# Patient Record
Sex: Female | Born: 1980 | Race: Black or African American | Hispanic: No | Marital: Married | Smoking: Never smoker
Health system: Southern US, Community
[De-identification: ages and names within clinical notes are randomized; demographics above are authoritative.]

## PROBLEM LIST (undated history)

## (undated) ENCOUNTER — Inpatient Hospital Stay (HOSPITAL_COMMUNITY): Payer: Self-pay

## (undated) DIAGNOSIS — I10 Essential (primary) hypertension: Secondary | ICD-10-CM

## (undated) DIAGNOSIS — Z923 Personal history of irradiation: Secondary | ICD-10-CM

## (undated) DIAGNOSIS — F329 Major depressive disorder, single episode, unspecified: Secondary | ICD-10-CM

## (undated) DIAGNOSIS — C801 Malignant (primary) neoplasm, unspecified: Secondary | ICD-10-CM

## (undated) DIAGNOSIS — F419 Anxiety disorder, unspecified: Secondary | ICD-10-CM

## (undated) DIAGNOSIS — Z9889 Other specified postprocedural states: Secondary | ICD-10-CM

## (undated) DIAGNOSIS — E119 Type 2 diabetes mellitus without complications: Secondary | ICD-10-CM

## (undated) DIAGNOSIS — Z98891 History of uterine scar from previous surgery: Secondary | ICD-10-CM

## (undated) DIAGNOSIS — A749 Chlamydial infection, unspecified: Secondary | ICD-10-CM

## (undated) DIAGNOSIS — R112 Nausea with vomiting, unspecified: Secondary | ICD-10-CM

## (undated) DIAGNOSIS — F32A Depression, unspecified: Secondary | ICD-10-CM

## (undated) DIAGNOSIS — O139 Gestational [pregnancy-induced] hypertension without significant proteinuria, unspecified trimester: Secondary | ICD-10-CM

## (undated) HISTORY — PX: WISDOM TOOTH EXTRACTION: SHX21

---

## 2004-03-03 ENCOUNTER — Emergency Department (HOSPITAL_COMMUNITY): Admission: EM | Admit: 2004-03-03 | Discharge: 2004-03-03 | Payer: Self-pay | Admitting: Emergency Medicine

## 2004-03-07 ENCOUNTER — Ambulatory Visit (HOSPITAL_COMMUNITY): Admission: RE | Admit: 2004-03-07 | Discharge: 2004-03-07 | Payer: Self-pay | Admitting: Emergency Medicine

## 2004-08-20 ENCOUNTER — Encounter (INDEPENDENT_AMBULATORY_CARE_PROVIDER_SITE_OTHER): Payer: Self-pay | Admitting: *Deleted

## 2004-08-20 LAB — CONVERTED CEMR LAB

## 2005-04-10 ENCOUNTER — Emergency Department (HOSPITAL_COMMUNITY): Admission: EM | Admit: 2005-04-10 | Discharge: 2005-04-10 | Payer: Self-pay | Admitting: Emergency Medicine

## 2005-07-09 ENCOUNTER — Emergency Department (HOSPITAL_COMMUNITY): Admission: EM | Admit: 2005-07-09 | Discharge: 2005-07-09 | Payer: Self-pay | Admitting: Family Medicine

## 2005-08-07 ENCOUNTER — Ambulatory Visit: Payer: Self-pay | Admitting: Sports Medicine

## 2005-09-07 ENCOUNTER — Ambulatory Visit: Payer: Self-pay | Admitting: Family Medicine

## 2005-09-18 ENCOUNTER — Ambulatory Visit: Payer: Self-pay | Admitting: Family Medicine

## 2005-10-19 ENCOUNTER — Ambulatory Visit: Payer: Self-pay | Admitting: Family Medicine

## 2005-12-04 ENCOUNTER — Emergency Department (HOSPITAL_COMMUNITY): Admission: EM | Admit: 2005-12-04 | Discharge: 2005-12-04 | Payer: Self-pay | Admitting: Family Medicine

## 2005-12-06 ENCOUNTER — Emergency Department (HOSPITAL_COMMUNITY): Admission: EM | Admit: 2005-12-06 | Discharge: 2005-12-06 | Payer: Self-pay | Admitting: Emergency Medicine

## 2005-12-07 ENCOUNTER — Ambulatory Visit: Payer: Self-pay | Admitting: Family Medicine

## 2005-12-29 ENCOUNTER — Ambulatory Visit: Payer: Self-pay | Admitting: Family Medicine

## 2006-02-08 ENCOUNTER — Ambulatory Visit: Payer: Self-pay | Admitting: Family Medicine

## 2006-03-06 ENCOUNTER — Ambulatory Visit: Payer: Self-pay | Admitting: Family Medicine

## 2006-07-19 DIAGNOSIS — E669 Obesity, unspecified: Secondary | ICD-10-CM | POA: Insufficient documentation

## 2006-07-20 ENCOUNTER — Encounter (INDEPENDENT_AMBULATORY_CARE_PROVIDER_SITE_OTHER): Payer: Self-pay | Admitting: *Deleted

## 2006-07-30 ENCOUNTER — Telehealth (INDEPENDENT_AMBULATORY_CARE_PROVIDER_SITE_OTHER): Payer: Self-pay | Admitting: *Deleted

## 2006-08-01 ENCOUNTER — Telehealth: Payer: Self-pay | Admitting: *Deleted

## 2006-08-01 ENCOUNTER — Ambulatory Visit: Payer: Self-pay | Admitting: Sports Medicine

## 2006-10-16 ENCOUNTER — Telehealth (INDEPENDENT_AMBULATORY_CARE_PROVIDER_SITE_OTHER): Payer: Self-pay | Admitting: *Deleted

## 2006-12-05 ENCOUNTER — Telehealth: Payer: Self-pay | Admitting: *Deleted

## 2006-12-17 ENCOUNTER — Telehealth (INDEPENDENT_AMBULATORY_CARE_PROVIDER_SITE_OTHER): Payer: Self-pay | Admitting: *Deleted

## 2006-12-19 ENCOUNTER — Ambulatory Visit: Payer: Self-pay | Admitting: Family Medicine

## 2006-12-27 ENCOUNTER — Telehealth (INDEPENDENT_AMBULATORY_CARE_PROVIDER_SITE_OTHER): Payer: Self-pay | Admitting: Family Medicine

## 2006-12-31 ENCOUNTER — Encounter (INDEPENDENT_AMBULATORY_CARE_PROVIDER_SITE_OTHER): Payer: Self-pay | Admitting: Family Medicine

## 2007-01-04 ENCOUNTER — Telehealth (INDEPENDENT_AMBULATORY_CARE_PROVIDER_SITE_OTHER): Payer: Self-pay | Admitting: *Deleted

## 2007-01-09 ENCOUNTER — Telehealth (INDEPENDENT_AMBULATORY_CARE_PROVIDER_SITE_OTHER): Payer: Self-pay | Admitting: *Deleted

## 2007-01-10 ENCOUNTER — Telehealth (INDEPENDENT_AMBULATORY_CARE_PROVIDER_SITE_OTHER): Payer: Self-pay | Admitting: *Deleted

## 2007-01-15 ENCOUNTER — Ambulatory Visit: Payer: Self-pay | Admitting: Family Medicine

## 2007-01-15 ENCOUNTER — Encounter (INDEPENDENT_AMBULATORY_CARE_PROVIDER_SITE_OTHER): Payer: Self-pay | Admitting: Family Medicine

## 2007-01-17 ENCOUNTER — Telehealth: Payer: Self-pay | Admitting: *Deleted

## 2007-05-19 ENCOUNTER — Emergency Department (HOSPITAL_COMMUNITY): Admission: EM | Admit: 2007-05-19 | Discharge: 2007-05-20 | Payer: Self-pay | Admitting: Emergency Medicine

## 2007-05-20 ENCOUNTER — Telehealth (INDEPENDENT_AMBULATORY_CARE_PROVIDER_SITE_OTHER): Payer: Self-pay | Admitting: Family Medicine

## 2007-07-23 ENCOUNTER — Inpatient Hospital Stay (HOSPITAL_COMMUNITY): Admission: AD | Admit: 2007-07-23 | Discharge: 2007-07-23 | Payer: Self-pay | Admitting: Obstetrics & Gynecology

## 2007-09-04 ENCOUNTER — Encounter: Payer: Self-pay | Admitting: *Deleted

## 2007-12-06 ENCOUNTER — Ambulatory Visit: Payer: Self-pay | Admitting: Family Medicine

## 2008-01-17 ENCOUNTER — Ambulatory Visit: Payer: Self-pay | Admitting: Family Medicine

## 2008-01-17 ENCOUNTER — Encounter: Admission: RE | Admit: 2008-01-17 | Discharge: 2008-01-17 | Payer: Self-pay | Admitting: Family Medicine

## 2008-02-24 ENCOUNTER — Telehealth: Payer: Self-pay | Admitting: *Deleted

## 2008-04-13 ENCOUNTER — Ambulatory Visit: Payer: Self-pay | Admitting: Family Medicine

## 2008-04-13 ENCOUNTER — Encounter: Payer: Self-pay | Admitting: Family Medicine

## 2008-04-13 ENCOUNTER — Telehealth: Payer: Self-pay | Admitting: *Deleted

## 2008-04-13 LAB — CONVERTED CEMR LAB: Whiff Test: NEGATIVE

## 2008-04-20 LAB — CONVERTED CEMR LAB
Chlamydia, DNA Probe: NEGATIVE
GC Probe Amp, Genital: NEGATIVE

## 2008-04-27 ENCOUNTER — Encounter: Payer: Self-pay | Admitting: *Deleted

## 2008-04-27 ENCOUNTER — Telehealth: Payer: Self-pay | Admitting: *Deleted

## 2008-04-27 ENCOUNTER — Emergency Department (HOSPITAL_COMMUNITY): Admission: EM | Admit: 2008-04-27 | Discharge: 2008-04-27 | Payer: Self-pay | Admitting: Emergency Medicine

## 2008-04-28 ENCOUNTER — Ambulatory Visit: Payer: Self-pay | Admitting: Family Medicine

## 2008-04-28 ENCOUNTER — Encounter: Payer: Self-pay | Admitting: Family Medicine

## 2008-04-30 ENCOUNTER — Encounter (INDEPENDENT_AMBULATORY_CARE_PROVIDER_SITE_OTHER): Payer: Self-pay | Admitting: Family Medicine

## 2008-04-30 ENCOUNTER — Ambulatory Visit: Payer: Self-pay | Admitting: Family Medicine

## 2008-05-07 ENCOUNTER — Ambulatory Visit: Payer: Self-pay | Admitting: Family Medicine

## 2008-05-08 ENCOUNTER — Telehealth: Payer: Self-pay | Admitting: Family Medicine

## 2008-05-27 ENCOUNTER — Ambulatory Visit: Payer: Self-pay | Admitting: Family Medicine

## 2008-06-03 ENCOUNTER — Telehealth: Payer: Self-pay | Admitting: *Deleted

## 2008-06-04 ENCOUNTER — Encounter: Payer: Self-pay | Admitting: Family Medicine

## 2008-06-05 ENCOUNTER — Encounter: Payer: Self-pay | Admitting: Family Medicine

## 2008-11-09 ENCOUNTER — Ambulatory Visit: Payer: Self-pay | Admitting: Family Medicine

## 2008-11-09 ENCOUNTER — Telehealth: Payer: Self-pay | Admitting: Family Medicine

## 2008-11-09 LAB — CONVERTED CEMR LAB
Bilirubin Urine: NEGATIVE
Glucose, Urine, Semiquant: NEGATIVE
Ketones, urine, test strip: NEGATIVE
Nitrite: NEGATIVE
Protein, U semiquant: NEGATIVE
Specific Gravity, Urine: 1.01
Urobilinogen, UA: 0.2
pH: 7

## 2008-11-10 ENCOUNTER — Encounter: Payer: Self-pay | Admitting: Family Medicine

## 2009-02-22 ENCOUNTER — Ambulatory Visit: Payer: Self-pay | Admitting: Family Medicine

## 2009-02-22 LAB — CONVERTED CEMR LAB: Whiff Test: POSITIVE

## 2009-02-23 ENCOUNTER — Encounter: Payer: Self-pay | Admitting: Family Medicine

## 2009-02-23 ENCOUNTER — Telehealth: Payer: Self-pay | Admitting: *Deleted

## 2009-02-24 ENCOUNTER — Telehealth (INDEPENDENT_AMBULATORY_CARE_PROVIDER_SITE_OTHER): Payer: Self-pay | Admitting: *Deleted

## 2009-02-26 ENCOUNTER — Ambulatory Visit: Payer: Self-pay | Admitting: Family Medicine

## 2009-02-26 DIAGNOSIS — B009 Herpesviral infection, unspecified: Secondary | ICD-10-CM | POA: Insufficient documentation

## 2009-07-08 ENCOUNTER — Telehealth: Payer: Self-pay | Admitting: Family Medicine

## 2009-07-21 ENCOUNTER — Ambulatory Visit: Payer: Self-pay | Admitting: Family Medicine

## 2009-08-04 ENCOUNTER — Encounter: Payer: Self-pay | Admitting: Family Medicine

## 2009-09-27 ENCOUNTER — Telehealth: Payer: Self-pay | Admitting: Family Medicine

## 2009-10-20 ENCOUNTER — Encounter: Payer: Self-pay | Admitting: Family Medicine

## 2009-10-20 ENCOUNTER — Telehealth: Payer: Self-pay | Admitting: Family Medicine

## 2009-10-25 ENCOUNTER — Telehealth: Payer: Self-pay | Admitting: Family Medicine

## 2009-10-27 ENCOUNTER — Ambulatory Visit: Payer: Self-pay | Admitting: Family Medicine

## 2009-11-09 ENCOUNTER — Ambulatory Visit: Payer: Self-pay | Admitting: Family Medicine

## 2009-11-19 ENCOUNTER — Telehealth (INDEPENDENT_AMBULATORY_CARE_PROVIDER_SITE_OTHER): Payer: Self-pay | Admitting: Family Medicine

## 2009-11-30 ENCOUNTER — Ambulatory Visit: Payer: Self-pay | Admitting: Family Medicine

## 2009-11-30 ENCOUNTER — Encounter: Payer: Self-pay | Admitting: Family Medicine

## 2010-02-22 ENCOUNTER — Telehealth: Payer: Self-pay | Admitting: *Deleted

## 2010-04-11 ENCOUNTER — Encounter: Payer: Self-pay | Admitting: Family Medicine

## 2010-04-11 ENCOUNTER — Ambulatory Visit (HOSPITAL_COMMUNITY): Admission: RE | Admit: 2010-04-11 | Discharge: 2010-04-11 | Payer: Self-pay | Admitting: Family Medicine

## 2010-04-11 ENCOUNTER — Encounter: Payer: Self-pay | Admitting: *Deleted

## 2010-04-11 ENCOUNTER — Ambulatory Visit: Payer: Self-pay | Admitting: Family Medicine

## 2010-04-11 DIAGNOSIS — F410 Panic disorder [episodic paroxysmal anxiety] without agoraphobia: Secondary | ICD-10-CM | POA: Insufficient documentation

## 2010-04-11 DIAGNOSIS — K3189 Other diseases of stomach and duodenum: Secondary | ICD-10-CM | POA: Insufficient documentation

## 2010-04-11 DIAGNOSIS — R1013 Epigastric pain: Secondary | ICD-10-CM

## 2010-04-11 DIAGNOSIS — G5600 Carpal tunnel syndrome, unspecified upper limb: Secondary | ICD-10-CM | POA: Insufficient documentation

## 2010-04-11 DIAGNOSIS — R0789 Other chest pain: Secondary | ICD-10-CM | POA: Insufficient documentation

## 2010-04-11 HISTORY — DX: Panic disorder (episodic paroxysmal anxiety): F41.0
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
## 2010-06-21 NOTE — Assessment & Plan Note (Signed)
Summary: depression/anxiety- worsened   Vital Signs:  Patient profile:   30 year old female Height:      63 inches Weight:      174.6 pounds BMI:     31.04 Temp:     97.7 degrees F oral Pulse rate:   80 / minute BP sitting:   126 / 78  (left arm) Cuff size:   regular  Vitals Entered By: Gladstone Pih (October 27, 2009 3:42 PM) CC: Anxiety issues, needs refill on Xanax Is Patient Diabetic? No Pain Assessment Patient in pain? no        Primary Care Provider:  Marisue Ivan  MD  CC:  Anxiety issues and needs refill on Xanax.  History of Present Illness: 30yo F c/o worsened anxiety and depression  Anxiety/Depression- Reports worsened symptoms over the past 1 1/2 months.  Reports more crying, sad days, decrease sleep/concentration.  No SI/HI.  Last dose of fluoxetine was 2 months ago.  She didn't think it was helping.  She attributes the worsening symptoms to her current job situation and stress of paying back school loans.    Habits & Providers  Alcohol-Tobacco-Diet     Tobacco Status: never  Current Medications (verified): 1)  Citalopram Hydrobromide 20 Mg Tabs (Citalopram Hydrobromide) .Marland Kitchen.. 1 Tab By Mouth Daily 2)  Valacyclovir Hcl 500 Mg Tabs (Valacyclovir Hcl) .... One Tablet By Mouth Daily  Allergies (verified): No Known Drug Allergies  Review of Systems      See HPI  Physical Exam  General:  VS Reviewed. Well appearing, NAD.  Psych:  Good eye contact A&O x 3 Not anxious appearing Emotionally labile No SI/HI   Impression & Recommendations:  Problem # 1:  DEPRESSION/ANXIETY (ICD-300.4) Assessment Deteriorated Symptoms worsened x 1 1/2 months due to current job and financial situation. Reassured me that she has no suicidal or homicidal ideations. Plan to change to celexa 20mg  daily with f/u in 2 weeks to reassess.  Orders: [facility]- Est Level  3 (85631)  Complete Medication List: 1)  Citalopram Hydrobromide 20 Mg Tabs (Citalopram hydrobromide) .Marland Kitchen.. 1  tab by mouth daily 2)  Valacyclovir Hcl 500 Mg Tabs (Valacyclovir hcl) .... One tablet by mouth daily  Patient Instructions: 1)  Please schedule a follow-up appointment in 2 weeks.  2)  I started you on citalopram 20mg  daily. Prescriptions: CITALOPRAM HYDROBROMIDE 20 MG TABS (CITALOPRAM HYDROBROMIDE) 1 tab by mouth daily  #30 x 1   Entered and Authorized by:   Marisue Ivan  MD   Signed by:   Marisue Ivan  MD on 10/27/2009   Method used:   Electronically to        [facility] [address] #[id number]* (retail)       [address] [place], [place]  [address]       Ph: [phone]       Fax: [phone]   RxID:   [id number]

## 2010-06-21 NOTE — Progress Notes (Signed)
Summary: refill  Phone Note Refill Request Call back at [phone] Message from:  Patient  Refills Requested: Medication #1:  VALACYCLOVIR HCL 500 MG TABS one tablet by mouth daily [facility] [address] [facility] [address] needs today  Initial call taken by: De Nurse,  February 22, 2010 9:54 AM  Follow-up for Phone Call        done & pt notified Follow-up by: Golden Circle RN,  February 22, 2010 10:14 AM    Prescriptions: VALACYCLOVIR HCL 500 MG TABS (VALACYCLOVIR HCL) one tablet by mouth daily  #34 x 3   Entered by:   Golden Circle RN   Authorized by:   Renold Don MD   Signed by:   Golden Circle RN on 02/22/2010   Method used:   Electronically to        [facility] #[id number]* (retail)       [address] [place], [place]  [address]       Ph: [phone]       Fax: [phone]   RxID:   [id number]

## 2010-06-21 NOTE — Assessment & Plan Note (Signed)
Summary: panic attacks,df   Vital Signs:  Patient profile:   30 year old female Height:      63 inches Weight:      180.5 pounds BMI:     32.09 Temp:     98.1 degrees F oral Pulse rate:   78 / minute BP sitting:   132 / 91  (right arm) Cuff size:   regular  Vitals Entered By: Garen Grams LPN (July 21, 252 4:30 PM) CC: panic attacks with frequent headaches Is Patient Diabetic? No   Primary Care Provider:  Marisue Ivan  MD  CC:  panic attacks with frequent headaches.  History of Present Illness: 1) Panic attacks 2) Depression: Patient with a history of panic attacks and depression comes in today to discuss increasing frequency of panic attacks. Prescribed Prozac but reports that she has only been taking it 2-3 days per week. Panic attacks worsened 2 weeks ago; stem from worry about her finances - Therapist, sports, bills, income tax. Symptoms are shortness of breath, left sided chest tightness, severe worry and tearfulness and headaches. Episodes last 10 - 20 minutes; relieved by deep breathing, going away from people. Reports decreased appetite, decreased concentration, anhedonia, tearfulness. Denies sleep disturbances, decreased energy, increased guilt, suicidal or homicidal ideation. Review of medicines reveals that patient has last had Fluoxetine filled in October 2010 for 90 pills. Has also been on Xanax as needed in the past, which she states helps with her panic attcks.       Habits & Providers  Alcohol-Tobacco-Diet     Tobacco Status: never  Current Medications (verified): 1)  Fluoxetine Hcl 10 Mg Tabs (Fluoxetine Hcl) .... One Tablet By Mouth in The Morning 2)  Replens  Gel (Vaginal Lubricant) .... Apply To External  Vaginal Area As Needed. Disp 30 G 3)  Flagyl 500 Mg Tabs (Metronidazole) .... One Tab By Mouth Two Times A Day X7 Days 4)  Valacyclovir Hcl 500 Mg Tabs (Valacyclovir Hcl) .... One Tablet By Mouth Daily 5)  Alprazolam 0.5 Mg Tabs (Alprazolam) .... One Tab  By Mouth Two Times A Day As Needed For Anxyiety, Panic Attacks  Allergies (verified): No Known Drug Allergies  Physical Exam  General:  vitals reviewed.  tearful but NAD  Lungs:  CTAB  Heart:  RRR no murmurs  Psych:  Oriented X3, memory intact for recent and remote, good eye contact, not anxious appearing, not agitated, not suicidal, not homicidal, depressed affect, and tearful.     Impression & Recommendations:  Problem # 1:  PANIC ATTACKS (ICD-300.01) Assessment Deteriorated  Will restart fluoxetine at previous dose. Advised on importance of taking medications every day. Will use alprazolam as a bridge; would discontinue once on stable dose of SSRI w/ improvement in symptoms. CBT - patient to think about things that make her happy and to try to put these into place in her life. Follow up one week with PCP for further management of this issue.  Her updated medication list for this problem includes:    Fluoxetine Hcl 10 Mg Tabs (Fluoxetine hcl) ..... One tablet by mouth in the morning    Alprazolam 0.5 Mg Tabs (Alprazolam) ..... One tab by mouth two times a day as needed for anxyiety, panic attacks  Orders: [facility]- Est Level  3 (27062)  Problem # 2:  DEPRESSION, MAJOR, RECURRENT (ICD-296.30) Assessment: Deteriorated  Will restart fluoxetine at previous dose. Advised on importance of taking medications every day. CBT - patient to think about things that  make her happy and to try to put these into place in her life. Follow up one week with PCP for further management of this issue. Titrate up SSRI as tolerated.   Orders: [facility]- Est Level  3 (29528)  Complete Medication List: 1)  Fluoxetine Hcl 10 Mg Tabs (Fluoxetine hcl) .... One tablet by mouth in the morning 2)  Replens Gel (Vaginal lubricant) .... Apply to external  vaginal area as needed. disp 30 g 3)  Flagyl 500 Mg Tabs (Metronidazole) .... One tab by mouth two times a day x7 days 4)  Valacyclovir Hcl 500 Mg Tabs (Valacyclovir  hcl) .... One tablet by mouth daily 5)  Alprazolam 0.5 Mg Tabs (Alprazolam) .... One tab by mouth two times a day as needed for anxyiety, panic attacks  Patient Instructions: 1)  Follow up with Dr. Burnadette Pop in 1 week.  2)  Start taking your Prozac EVERY day. 3)  Take alprazolam (Xanax) as needed as directed for panic attacks.  4)  Think about things that make you happy and try to put these into place in your life.  Prescriptions: FLUOXETINE HCL 10 MG TABS (FLUOXETINE HCL) one tablet by mouth in the morning  #90 x 0   Entered and Authorized by:   Bobby Rumpf  MD   Signed by:   Bobby Rumpf  MD on 07/21/2009   Method used:   Print then Give to Patient   RxID:   [id number] ALPRAZOLAM 0.5 MG TABS (ALPRAZOLAM) one tab by mouth two times a day as needed for anxyiety, panic attacks  #20 x 0   Entered and Authorized by:   Bobby Rumpf  MD   Signed by:   Bobby Rumpf  MD on 07/21/2009   Method used:   Print then Give to Patient   RxID:   [id number]

## 2010-06-21 NOTE — Progress Notes (Signed)
Summary: Letter Needed  Phone Note Call from Patient Call back at Home Phone [phone]   Caller: Patient Summary of Call: Needs new letter being more specific about her dx such as why she is depressed.  The finiacial aid people are saying the first letter is not clear enough for them.  Pt would like to pick up letter. Initial call taken by: Clydell Hakim,  November 19, 2009 2:44 PM  Follow-up for Phone Call        to PCP Follow-up by: Gladstone Pih,  November 19, 2009 3:19 PM  Additional Follow-up for Phone Call Additional follow up Details #1::        This is a new patient for me.  I have reviewed her medical record.  I could probably provide her with a somewhat detailed letter, but she would most likely need to be seen to go over with me the details of her illness. Additional Follow-up by: Renold Don MD,  November 22, 2009 7:48 PM    Additional Follow-up for Phone Call Additional follow up Details #2::    left message to return call Follow-up by: Gladstone Pih,  November 23, 2009 9:09 AM  Additional Follow-up for Phone Call Additional follow up Details #3:: Details for Additional Follow-up Action Taken: Spoke with Pt, will call back to sched apt to discuss information needed for letter, to PCP FYI Additional Follow-up by: Gladstone Pih,  November 23, 2009 9:36 AM  thanks, will see her in clinic.  Renold Don MD  November 23, 2009 1:14 PM

## 2010-06-21 NOTE — Letter (Signed)
Summary: Generic Letter  [facility]  [address]   [place], [place] [address]   Phone: [phone]  Fax: [phone]    11/30/2009  Brooke Marks [address] [place], [place]  [address]  To Whom I May Concern:  I am writing to discuss my patient Brooke Marks, who has been experiencing increasing depression and anxiety since 10/30/2001 and the death of her father.  Her symptoms have progressively worsened over the past year to the point where she is now experiencing daily symptoms, including almost daily panic attacks.  Some of these occur multiple times a day.  Her symptoms are progressing to the point where she is having difficulty with her activities of daily living.    She has attempted counseling with therapists as well as multiple trials of medications with only minimal relief.  She continues ongoing, current therapy treatments as well as medication in an attempt to relieve these symptoms.  She is also currently under my care for this condition.  Please take her worsening symptoms into consideration.   Sincerely,   Renold Don MD   Appended Document: Generic Letter cert mailed

## 2010-06-21 NOTE — Miscellaneous (Signed)
Summary: panic attack  Clinical Lists Changes states this series of panic attacks started 2 weeks ago. (has had this for years) the meds usually takes care of it. feels tingly & no feeling in L hand, tingling down entire L side. HA pain is 7/10 . has been having panic attacks daily. she is at work & has not taken her meds. suggested she take the med for anxiety & may take otc for the HA. sent to ED due to only L sided numbness & level of pt's concern.Golden Circle RN  August 04, 2009 12:11 PM  Agree with plan.........Marland KitchenMarisue Ivan, MD

## 2010-06-21 NOTE — Progress Notes (Signed)
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 Initial call taken by: Knox Royalty,  October 20, 2009 10:13 AM  Follow-up for Phone Call        letter completed Follow-up by: Marisue Ivan  MD,  October 20, 2009 4:22 PM    forward to pcp.Loralee Pacas CMA  October 20, 2009 2:31 PM

## 2010-06-21 NOTE — Assessment & Plan Note (Signed)
Summary: f/u depression and discuss wt   Vital Signs:  Patient profile:   30 year old female Height:      63 inches Weight:      174.8 pounds BMI:     31.08 Temp:     98.6 degrees F oral Pulse rate:   80 / minute BP sitting:   126 / 80  (left arm) Cuff size:   regular  Vitals Entered By: Gladstone Pih (November 09, 2009 3:49 PM) CC: F/U Depression and Meds Is Patient Diabetic? No Pain Assessment Patient in pain? no        Primary Care Provider:  Marisue Ivan  MD  CC:  F/U Depression and Meds.  History of Present Illness: 30yo F here for f/u of depression and to discuss wt  Depression: Improved since last visit and starting Celexa.  No adverse side effects from medication.  No SI/HI.  Reports improvement with financial situation and stressors.  Feels more happy with things and wants to continue on the medication.  Weight: Pt desires to lose wt.  States she initially gained wt after starting depo.  She has since d/c'd the depo and has been exercising 3-4 times a week for an hour at a time doing aerobic exercises.  She is trying to eat healthy but has difficulty with her mom's unhealthy cooking.  Habits & Providers  Alcohol-Tobacco-Diet     Tobacco Status: never  Current Medications (verified): 1)  Citalopram Hydrobromide 20 Mg Tabs (Citalopram Hydrobromide) .Marland Kitchen.. 1 Tab By Mouth Daily 2)  Valacyclovir Hcl 500 Mg Tabs (Valacyclovir Hcl) .... One Tablet By Mouth Daily  Allergies (verified): No Known Drug Allergies  Review of Systems      See HPI  Physical Exam  General:  VS Reviewed. Well appearing, NAD.  Psych:  A&O x 3 Normal affect good eye contact No SI/HI   Impression & Recommendations:  Problem # 1:  DEPRESSION/ANXIETY (ICD-300.4) Assessment Improved  Symptoms drastically improved in the past  2 weeks. Will continue on the Celexa and f/u in the next 2-3 months or sooner if needed.  Orders: [facility]- Est Level  3 ([address])  Problem # 2:  OBESITY, NOS  (ICD-278.00) Assessment: Unchanged  BMI 31 Discussed calorie counting and burning more calories than she takes in.  She is intelligent enough and active enough to implement this program. She is to f/u as needed if she is having difficulty losing weight or calculating calories.  Orders: [facility]- Est Level  3 ([address]454)  Complete Medication List: 1)  Citalopram Hydrobromide 20 Mg Tabs (Citalopram hydrobromide) .Marland Kitchen.. 1 tab by mouth daily 2)  Valacyclovir Hcl 500 Mg Tabs (Valacyclovir hcl) .... One tablet by mouth daily

## 2010-06-21 NOTE — Assessment & Plan Note (Signed)
Summary: letter needed,tcb   Vital Signs:  Patient profile:   30 year old female Weight:      174 pounds BMI:     30.93 Temp:     97.8 degrees F oral Pulse rate:   88 / minute BP sitting:   130 / 96  (right arm) Cuff size:   regular  Vitals Entered By: Jimmy Footman, CMA (November 30, 2009 3:25 PM) CC: Needs letter stateing why she is depressed and for anexity Is Patient Diabetic? No Pain Assessment Patient in pain? no        Primary Care Provider:  Marisue Ivan  MD  CC:  Needs letter stateing why she is depressed and for anexity.  History of Present Illness: Depression/anxiety:  Started in Oct 11, 2001 after death of her father.  States that since then "I can't get stable."  Has attempted therapy with school coaches and school counselors.  Describes attacks with variable presentation: can't breathe, shaky, head hurts, left side numb, chest hurts, feels like she will drop dead.  Can last just a few minutes or hours at a time.  Feels like going to sleep is the only thing that helps.  No real alleviating factors except time.  No specific triggers for panic attacks, just feels chronic depression at baseline.    ROS:  currently no headaches, pre-syncopal or syncopal episodes, chest pain, palpitations, shortness of breath or dyspnea, abdominal pain, diarrhea or constipation, melena, hematochezia, lower extremity swelling.     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 Take 1 Tab By Mouth Daily For Depression 2)  Valacyclovir Hcl 500 Mg Tabs (Valacyclovir Hcl) .... One Tablet By Mouth Daily 3)  Lorazepam 1 Mg Tabs (Lorazepam) .... Take 1 Tab By Mouth Q 8 Hours As Needed or Anxiety  Allergies (verified): No Known Drug Allergies  Past History:  Past medical, surgical, family and  social histories (including risk factors) reviewed, and no changes noted (except as noted below).  Past Medical History: Reviewed history from 05/27/2008 and no changes required. Recurrent Depression  Past Surgical History: Reviewed history from 12/19/2006 and no changes required. none  Family History: Reviewed history from 12/19/2006 and no changes required. HTN, DM, Renal Dz, CAD across both sides father died- MI Brother died of poisoning 2006-10-12  Social History: Reviewed history from 05/27/2008 and no changes required. Lives at home with mother, [place].  Father died in 10/11/2001 of MI @ 72yo.  Has a large family, 1 suster and 3 brothers from same mother, 2 sisters and 1 brother from same father.  Works at [employer] in [employer].  School at [employer], [employer].  Nonsmoker, non-drinker.    Physical Exam  General:  Vital signs reviewed Well-developed, well-nourished patient in NAD.  Awake, cooperative.  Psych:  no suicidal or homicidal ideations.  Good eye contact.  No anxious or depressed appearing.  Oriented X3, memory intact for recent and remote, and normally interactive.     Impression & Recommendations:  Problem # 1:  DEPRESSION/ANXIETY (ICD-300.4) Assessment Unchanged Chronic problem for patient.  Per patient she has attempted multiple anti-depressants in past but unable to name any by memory.  States Xanax has worked for her in past.  Told patient I as a provider am not comfortable prescribing Xanax due to short duration of action.  States Celexa may be  helping some.  Plan to increase dose today, will provide 2 week bridge of Ativan, just for 2 weeks.  Patient expresses understanding.  Provided letter detailing her condition. Orders: [facility]- Est Level  3 ([address])  Complete Medication List: 1)  Citalopram Hydrobromide 40 Mg Tabs (Citalopram hydrobromide) .... Take 1 tab by mouth daily for depression 2)  Valacyclovir Hcl 500 Mg Tabs (Valacyclovir hcl) .... One tablet by mouth daily 3)  Lorazepam  1 Mg Tabs (Lorazepam) .... Take 1 tab by mouth q 8 hours as needed or anxiety Prescriptions: LORAZEPAM 1 MG TABS (LORAZEPAM) Take 1 tab by mouth q 8 hours as needed or anxiety  #20 x 0   Entered and Authorized by:   Renold Don MD   Signed by:   Renold Don MD on 11/30/2009   Method used:   Printed then faxed to ...       [facility]  [address] [phone]* (retail)       [address]       [place], [place]  [address]       Ph: [phone]       Fax: [phone]   RxID:   [id number] CITALOPRAM HYDROBROMIDE 40 MG TABS (CITALOPRAM HYDROBROMIDE) Take 1 tab by mouth daily for depression  #31 x 3   Entered and Authorized by:   Renold Don MD   Signed by:   Renold Don MD on 11/30/2009   Method used:   Electronically to        [facility] [phone]* (retail)       [address]       [place], [place]  [address]       Ph: [phone]       Fax: [phone]   RxID:   [id number]

## 2010-06-21 NOTE — Miscellaneous (Signed)
Summary: triage call  Clinical Lists Changes patient calls stating she had had chest pain , sharp pains around her heart off and on for 2 days . also has episodes of SOB, feeling like she cannot take a deep breath. feels some tightness in her chest at times. not having discomfort currently . states she has known panic attacks.  advised her to go to ED now. she states she cannot leave work until 2:00 PM and would prefer to be seen here. over the phone patient does not sound in any distress. appointment scheduled at 3:15 PM today for work in but advised to go to ED if worsens. Theresia Lo RN  April 11, 2010 10:26 AM

## 2010-06-21 NOTE — Letter (Signed)
Summary: Generic Letter  [facility]  [address]   [place], [place] [address]   Phone: [phone]  Fax: [phone]    10/20/2009  [name] [address] [place], [place]  [address]  To Whom It May Concern,  Brooke Marks (per her permission) asked me to disclose the date of her diagnosis of panic attacks which was 08/07/2005.  Sincerely,   Brooke Ivan  MD  Appended Document: Generic Letter letter sent  Appended Document: Generic Letter Letter returned uable to forward.  Called left message for pt to call and update address.
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
## 2010-06-21 NOTE — Progress Notes (Signed)
Summary: Rx Req  Phone Note Call from Patient Call back at Home Phone [phone]   Caller: Patient Summary of Call: Pt requesting rx for xanax says she has gotten it from [place] before.  Pt uses [facility] on [address]. Initial call taken by: Clydell Hakim,  July 08, 2009 8:49 AM  Follow-up for Phone Call        will forward to MD. Follow-up by: Theresia Lo RN,  July 08, 2009 11:08 AM  Additional Follow-up for Phone Call Additional follow up Details #1::        I will not give her xanax.  Especially over the phone without an evaluation.  Will have the team notify her of this. Additional Follow-up by: Marisue Ivan  MD,  July 08, 2009 10:26 PM     Appended Document: Rx Req patient notified.

## 2010-06-21 NOTE — Progress Notes (Signed)
Summary: triage  Phone Note Call from Patient Call back at [phone]   Caller: Patient Summary of Call: Pt having panic attacks. Initial call taken by: Clydell Hakim,  October 25, 2009 10:32 AM  Follow-up for Phone Call        she started the conversation telling me she just gets junk mail, "nothing important" when I asked her what I could do she states she wanted an appt with her md. none until wed. she was ok with this . has someone she can talk to when she is stressed. to call back sooner if needing to be seen, it will be with another md Follow-up by: Golden Circle RN,  October 25, 2009 12:05 PM  Additional Follow-up for Phone Call Additional follow up Details #1::        will f/u accordingly  Additional Follow-up by: Marisue Ivan  MD,  October 25, 2009 1:11 PM

## 2010-06-21 NOTE — Assessment & Plan Note (Signed)
Summary: chest discomfort and episodes of SOB/ls   Vital Signs:  Patient profile:   30 year old female Height:      63 inches Weight:      73 pounds BMI:     12.98 Pulse rate:   82 / minute BP sitting:   124 / 85  (left arm) Cuff size:   regular  Vitals Entered By: Tessie Fass CMA (April 11, 2010 3:06 PM) CC: chest pain, SOB, lightheaded x 2 days Is Patient Diabetic? No Pain Assessment Patient in pain? yes     Location: chest Intensity: 5   Primary Care Provider:  Renold Don MD  CC:  chest pain, SOB, and lightheaded x 2 days.  History of Present Illness: 30 yo F:  1. CP: Left side with radiation to epigastrum and left forearm to second and third digits. CP started 2 days ago, arm pain/numbness started yesterday. CP is sometimes sharp, intermittent, worse with eating certain foods and when she is anxious. Better with "reflux medication." Hand/finger pain/numbness similar to previous carpal tunnel, using brace makes better. Endorses more stress lately. Eating and drinking normally. No SOB, diaphoresis, syncope, N/V/D, LE edema, rash. No Hx cardiac issues. PMHx of carpal tunnel, dyspepsia, and panic attacks. Patient does "not like to take medications."   Habits & Providers  Alcohol-Tobacco-Diet     Tobacco Status: never  Current Medications (verified): 1)  Valacyclovir Hcl 500 Mg Tabs (Valacyclovir Hcl) .... One Tablet By Mouth Daily 2)  Lorazepam 1 Mg Tabs (Lorazepam) .... Take 1 Tab By Mouth Q 8 Hours As Needed or Anxiety  Allergies (verified): No Known Drug Allergies PMH-FH-SH reviewed for relevance  Review of Systems      See HPI  Physical Exam  General:  Vital signs reviewed. Well-developed, well-nourished patient in NAD.  Awake, cooperative. Head:  Normocephalic and atraumatic without obvious abnormalities.  Eyes:  EOMI. Perrla.  Lungs:  Normal respiratory effort, chest expands symmetrically. Lungs are clear to auscultation, no crackles or  wheezes. Heart:  RRR no murmurs. Abdomen:  Soft, normal bowel sounds, no distention, no masses, no guarding, and epigastric tenderness.   Psych:  No suicidal or homicidal ideations.  Good eye contact.  Not anxious or depressed appearing.  Oriented X3, memory intact for recent and remote, and normally interactive.     Impression & Recommendations:  Problem # 1:  CHEST PAIN, ATYPICAL (ICD-786.59) Assessment New  Chest pain with radiation likely combination of (1) panic attack, (2) GERD, and (3) carpal tunnel. Discussed each issue with patient. She agrees with assessment. EKG NSR with rate 79, no ST/T changes. Patient does not want to take a daily medication for anxiety/depression. Agreed plan: Ativan 1 mg as needed for panic attacks. Previous Rx have lasted several months. No SI/HI. Patient to follow up with PCP for f/u. Recommended Zantac as needed for dyspepsia. Continue with wrist brace.  Orders: [facility]- Est  Level 4 (99214)  Problem # 2:  PANIC ATTACK (ICD-300.01) Assessment: New  See # 1. The following medications were removed from the medication list:    Citalopram Hydrobromide 40 Mg Tabs (Citalopram hydrobromide) .Marland Kitchen... Take 1 tab by mouth daily for depression Her updated medication list for this problem includes:    Lorazepam 1 Mg Tabs (Lorazepam) .Marland Kitchen... Take 1 tab by mouth q 8 hours as needed or anxiety  Orders: [facility]- Est  Level 4 (99214)  Problem # 3:  DYSPEPSIA (ICD-536.8) Assessment: New  See # 1.  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  Orders: [facility]- Est  Level 4 (19147)  Complete Medication List: 1)  Valacyclovir Hcl 500 Mg Tabs (Valacyclovir hcl) .... One tablet by mouth daily 2)  Lorazepam 1 Mg Tabs (Lorazepam) .... Take 1 tab by mouth q 8 hours as needed or anxiety  Other Orders: EKG- [facility] (EKG)  Patient Instructions: 1)  It was nice to meet you today! 2)  Take Ranitidine for the  heartburn. 3)  Take Lorazepam for the anxiety. 4)  Continue using the brace for your carpal tunnel. Prescriptions: LORAZEPAM 1 MG TABS (LORAZEPAM) Take 1 tab by mouth q 8 hours as needed or anxiety  #20 x 0   Entered and Authorized by:   Helane Rima DO   Signed by:   Helane Rima DO on 04/11/2010   Method used:   Print then Give to Patient   RxID:   [id number]    Orders Added: 1)  EKG- [facility] [EKG] 2)  [facility]- Est  Level 4 [96295]

## 2010-07-01 ENCOUNTER — Encounter: Payer: Self-pay | Admitting: *Deleted

## 2010-10-03 ENCOUNTER — Encounter: Payer: Self-pay | Admitting: Family Medicine

## 2010-10-03 ENCOUNTER — Ambulatory Visit (INDEPENDENT_AMBULATORY_CARE_PROVIDER_SITE_OTHER): Payer: Self-pay | Admitting: Family Medicine

## 2010-10-03 VITALS — BP 130/88 | HR 92 | Wt 172.0 lb

## 2010-10-03 DIAGNOSIS — F410 Panic disorder [episodic paroxysmal anxiety] without agoraphobia: Secondary | ICD-10-CM

## 2010-10-03 DIAGNOSIS — K3189 Other diseases of stomach and duodenum: Secondary | ICD-10-CM

## 2010-10-03 DIAGNOSIS — R1013 Epigastric pain: Secondary | ICD-10-CM

## 2010-10-03 DIAGNOSIS — F341 Dysthymic disorder: Secondary | ICD-10-CM

## 2010-10-03 DIAGNOSIS — R0789 Other chest pain: Secondary | ICD-10-CM

## 2010-10-03 LAB — TSH: TSH: 0.661 u[IU]/mL (ref 0.350–4.500)

## 2010-10-03 LAB — POCT H PYLORI SCREEN: H Pylori Screen, POC: NEGATIVE

## 2010-10-03 NOTE — Patient Instructions (Signed)
Nice to meet you! Try to avoid extra caffeine, carbonated beverages and spicy foods. I will call you if your lab test is abnormal. Please keep taking prilosec once daily. Call if your symptoms don't improve in next 1 week. I think you would benefit from talking with a therapist and possible depression treatment.  Please make an appointment with Dr. Gwendolyn Grant if you would like to discuss this.  Diet for GERD or PUD Nutrition therapy can help ease the discomfort of gastroesophageal reflux disease (GERD) and peptic ulcer disease (PUD).  HOME CARE INSTRUCTIONS  Eat your meals slowly, in a relaxed setting.   Eat 5 to 6 small meals per day.   If a food causes distress, stop eating it for a period of time.  FOODS TO AVOID:  Coffee, regular or decaffeinated.  Cola beverages, regular or low calorie.   Tea, regular or decaffeinated.   Pepper.   Cocoa.   High fat foods including meats.   Butter, margarine, hydrogenated oil (trans fats).  Peppermint or spearmint (if you have GERD).   Fruits and vegetables as tolerated.   Alcoholic beverages.   Nicotine (smoking or chewing). This is one of the most potent stimulants to acid production in the gastrointestinal tract.   Any food that seems to aggravate your condition.   If you have questions regarding your diet, call your caregiver's office or a registered dietitian. OTHER TIPS IF YOU HAVE GERD:  Lying flat may make symptoms worse. Keep the head of your bed raised 6 to 9 inches by using a foam wedge or blocks under the legs of the bed.   Do not lay down until 3 hours after eating a meal.   Daily physical activity may help reduce symptoms.  MAKE SURE YOU:   Understand these instructions.   Will watch your condition.   Will get help right away if you are not doing well or get worse.  Document Released: 05/08/2005 Document Re-Released: 09/24/2008 [facility] Patient Information 2011 [place], [place].

## 2010-10-03 NOTE — Assessment & Plan Note (Addendum)
Likely contributing to her dyspepsia since recent psychosocial stressors correlate with onset of symptoms. Recommend she contact [facility] for therapy or call our front to schedule time with Dr. Pascal Lux. Follow up with Dr. Gwendolyn Grant to discuss medication options if desired. Will check TSH today.

## 2010-10-03 NOTE — Assessment & Plan Note (Signed)
Recurrent, most likely triggered by combination caffeine, obesity, stress reaction. Will check H.pylori today. Encouraged to restart omeprazole 20mg  daily and follow up if symptoms do not improve in 1-2 weeks. Avoidance of common food triggers and NSAIDs.

## 2010-10-03 NOTE — Progress Notes (Signed)
  Subjective:    Patient ID: Brooke Marks, female    DOB: 04-03-1981, 30 y.o.   MRN: [id number]  HPI 1. Chest discomfort. Patient describes onset of 'spasm' sensation after eating meals for past 2-3 weeks, correlated temporally with an increase in coca-cola ingestion. Located in upper sternum, centrally, no radiation. This feeling did improve after eating, now it is becoming more constant. Has associated indigestion and belching. Feels quite anxious about this, scared because her aunt recently passed away from heart and lung problems. Has felt similar sensation in the past. Has not taken any NSAIDs, no alcohol, smoking. Took one dose of OTC prilosec, but afraid to take pills. Denies vomitting, diarrhea, hematochezia, constipation, SOB.   2. Anxiety. Has panic attacks. 2 this week. Takes lorazepam prn. Under lots of stress especially past 2-3 weeks. Car problems costing lots of money and now needs to go back to the shop. Family issues as well. Keeps her feelings inside, does not use family for support. Has been treated for depression in the past, but usually stops medication because does not like pills.    Review of Systems See HPI. Endorses feeling of heart beating, sometimes fast.     Objective:   Physical Exam  Vitals reviewed. Constitutional: She is oriented to person, place, and time. She appears well-developed and well-nourished. She appears distressed.       Patient is tearful in sharing her recent stressors. Anxious about cause of her problem.  HENT:  Head: Normocephalic and atraumatic.  Neck: Normal range of motion.  Cardiovascular: Normal rate, regular rhythm and normal heart sounds.   No murmur heard. Pulmonary/Chest: Effort normal and breath sounds normal. No respiratory distress. She has no wheezes. She has no rales. She exhibits tenderness.  Abdominal: Soft. Bowel sounds are normal. She exhibits no distension. There is tenderness. There is no rebound and no guarding.   Mild epigastric tenderness.  Neurological: She is alert and oriented to person, place, and time.  Skin: She is not diaphoretic.  Psychiatric: Her behavior is normal.       Tearful, anxious and dull affect.           Assessment & Plan:

## 2010-10-03 NOTE — Assessment & Plan Note (Signed)
History sound consistent with esophagitis/gastritis given suprasternal location, patient's age, and association with food. Will give trial of PPI and follow up if symptoms do not improve or she develops red flag symptoms.

## 2010-10-07 NOTE — H&P (Signed)
Brooke Marks, RAYA NO.:  [id number]   MEDICAL RECORD NO.:  [id number]          PATIENT TYPE:  EMS   LOCATION:  [facility]                         FACILITY:  [facility]   PHYSICIAN:  Dwana Curd. Para March, M.D. DATE OF BIRTH:  13-Apr-1981   DATE OF ADMISSION:  12/06/2005  DATE OF DISCHARGE:  12/06/2005                                HISTORY & PHYSICAL   CHIEF COMPLAINT:  Shortness of breath and chest pain.   HISTORY OF PRESENT ILLNESS:  The patient is a 30 year old African-American  female, with a history of panic attacks who had been tapered off of her  Xanax recently as an outpatient, who called in tonight to the on-call  physician line complaining of chest pain.  At this time, she was already en  route to [facility] and she was complaining of her left arm being  sore and also left jaw pain.  Her hand was tingling.  She said that she had  gotten a CNA to check her blood pressure and it was real high.  Since she  was already en route to the ED, she was advised to proceed so long as she  was able to drive.  She said that she would be.  I called the charge nurse  in the ED and arranged to see her as soon as she was triaged.   Note the patient has a previous history of panic attacks.  On talking with  the patient, this is similar to her previous symptoms, though worse than  normal.  She has taken Xanax without any relief of her symptoms.  Also  making it apparent to the conversation that these symptoms have been going  on now for several days.  She has had two close family members pass away and  this has been exceedingly stressful for her.  She has had at least one  significant panic attack for the last three days.  The one that was  continuing at the time of presentation actually began early that morning at  approximately 5 a.m.   REVIEW OF SYSTEMS:  No fevers documented.  Noted shortness of breath and  chest pain along with numbness in the left arm and tingling in  the feet.  No  nausea, no vomiting and no rash.   PAST MEDICAL HISTORY:  1.  Panic attacks.  2.  Obesity.  3.  History of contraception with Depo-Provera shot.   SOCIAL HISTORY:  She lives at home with her mother, Brooke Marks.  Father died in  2004/10/04 of a MI at 33 years of age.  She has a large family.  She works in  Clinical biochemist at [employer].  She is a nonsmoker and nondrinker.   FAMILY HISTORY:  Multiple members of the family have hypertension, diabetes,  renal disease and coronary artery disease.   MEDICATIONS:  The patient was in the midst of a Xanax taper.  She is also on  the Depo shot.   ALLERGIES:  No known drug allergies.   PHYSICAL EXAMINATION:  VITAL SIGNS:  The patient was initially tachycardic  in the  120s.  She was reassured during the conversation and physical exam.  Her heart rate decreased into the 80s.  Initially, her systolic blood  pressure was slightly elevated in the 130s to 140s.  Respiratory rate 18 to  22.  The patient was afebrile.  HEENT:  Normocephalic and atraumatic.  Moist mucus membranes.  Extraocular  movements intact.  Pupils are equal, round, and reactive to light.  NECK:  Supple.  No lymphadenopathy appreciated.  CARDIOVASCULAR:  Normal S1, normal S2.  No murmurs, gallops, or rubs  appreciated.  Initially tachycardia.  On recheck, the patient has a normal  rhythm at around 80 beats per minute.  PULMONARY:  Clear to auscultation bilaterally.  No wheezes, rales or  rhonchi.  CHEST WALL:  The patient is minimally tender diffusely across her precordium  along all ribs and also along the clavicle and humerus bilaterally.  ABDOMEN:  Soft, nontender, and nondistended.  Positive bowel sounds.  EXTREMITIES:  No edema.  No rash, no bands, no cords, no calf discrepancy is  noted.  She has 2+ dorsalis pedis and radial pulses with her active  capillary refill.  NEUROLOGICAL:  Cranial nerves II through XII intact.  Strength and sensation  within normal  limits for the bilateral upper and lower extremities.   EKG sinus tachycardia with no ST changes.   ASSESSMENT/PLAN:  1.  The patient is a 30 year old female with the following problem of panic      attack:  This is consistent with her previous symptoms.  She responded      to reassurance.  She has an EKG that is overall reassuring.  I discussed      this case with Dr. Ignacia Palma, the attending in the emergency department.      We saw no need towards medications as the patient was feeling better      after being able to talk about her symptoms with me and also with Dr.      Yetta Barre, another resident on the team.  She was given Ativan 2 mg p.o. in      the emergency department and she will follow up with Dr. Mannie Stabile tomorrow      morning.  It is highly likely that the patient's family stressors have      contributed to her present symptoms.  She was agreeable with the plan.  2.  Note that the differential and diagnosis includes myocardial infarction;      however, the patient has a reassuring EKG with the exception of the      tachycardia.  Pneumothorax and pneumonia are unlikely.  The patient has      good breath sounds with no fever and also symptoms that resolve with      reassurance.  The patient has no symptoms that would suggest aortic      dissection nor pericarditis.  She has no reason documented that would      necessitate intervention other than outpatient follow up.      Dwana Curd Para March, M.D.     GSD/MEDQ  D:  12/06/2005  T:  12/07/2005  Job:  [id number]   cc:   Towana Badger, M.D.  Fax: [phone]

## 2010-10-20 ENCOUNTER — Encounter: Payer: Self-pay | Admitting: Family Medicine

## 2010-10-20 ENCOUNTER — Ambulatory Visit (INDEPENDENT_AMBULATORY_CARE_PROVIDER_SITE_OTHER): Payer: Self-pay | Admitting: Family Medicine

## 2010-10-20 DIAGNOSIS — F341 Dysthymic disorder: Secondary | ICD-10-CM

## 2010-10-23 NOTE — Progress Notes (Signed)
  Subjective:    Patient ID: Brooke Marks, female    DOB: 1981/01/24, 30 y.o.   MRN: [id number]  HPI 1.  Depression/anxiety:  Feels vastly overwhelmed at work, home.  Working Engineering geologist currently.  Is trying to get into school this fall but having difficulty obtaining financial aid.  Is only one of her siblings who lives at home and cares for her mom, feels very frustrated trying to care for her while maintaining full time job and receiving no help from family.  Denies any suicidal/homicidal ideations, but does report sometimes wishing "I would not wake up."  No plan for hurting herself, say she cannot leave her mom by herself.  Has Ativan for anxiety but does not like to take medications so has only taken 2 in past 2 weeks to prevent panic attacks when she felt entirely overwhelmed.  Does not want refills.  No headaches, chest pain, shortness of breath.     Review of Systems See HPI above for review of systems.       Objective:   Physical Exam Gen:  WD/WN AA female tearful throughout interview.  Vitals reviewed. Psych:  Depressed affect, seems also anxious.         Assessment & Plan:

## 2010-10-23 NOTE — Assessment & Plan Note (Signed)
Discussed again speaking with Dr. Pascal Lux, she states that she will think about this.   Would like to obtain FMLA paperwork for short leave of absence from work while she attempts to continue trying to get into school. Does not want medications at this time, recommended long-term SSRI, patient to consider this.   Gave explicit warnings and reasons to call clinic or go to ED if continues to worsen or any suicidality.

## 2010-12-30 ENCOUNTER — Telehealth: Payer: Self-pay | Admitting: Family Medicine

## 2010-12-30 NOTE — Telephone Encounter (Signed)
Pt dropped off fmla forms to be completed, placed in K Foster's office for any clinical completion.

## 2010-12-30 NOTE — Telephone Encounter (Signed)
Form placed in Dr. Tyson Alias box.

## 2011-01-04 ENCOUNTER — Ambulatory Visit (INDEPENDENT_AMBULATORY_CARE_PROVIDER_SITE_OTHER): Payer: Self-pay | Admitting: Family Medicine

## 2011-01-04 ENCOUNTER — Encounter: Payer: Self-pay | Admitting: Family Medicine

## 2011-01-04 VITALS — BP 125/86 | HR 81 | Temp 97.9°F | Ht 62.75 in | Wt 178.6 lb

## 2011-01-04 DIAGNOSIS — F410 Panic disorder [episodic paroxysmal anxiety] without agoraphobia: Secondary | ICD-10-CM

## 2011-01-04 MED ORDER — LORAZEPAM 1 MG PO TABS: 1.0000 mg | ORAL_TABLET | Freq: Three times a day (TID) | ORAL | Status: DC | PRN

## 2011-01-04 NOTE — Telephone Encounter (Signed)
Form is ready for pick up.  Left message for patient. 

## 2011-01-05 NOTE — Assessment & Plan Note (Signed)
Refill lorazepam for symptomatic relief.   She has not needed this for several months until panic attacks returned.   Plan on filling out FMLA paperwork to best of my ability. Encouraged her to continue search for outside counselor and that we can provide referral.  Patient gracious.   Discussed SSRI's, if needs continual benzo treatment, will make this switch.

## 2011-01-05 NOTE — Progress Notes (Signed)
  Subjective:    Patient ID: Brooke Marks, female    DOB: 08/25/1980, 30 y.o.   MRN: [id number]  HPI 1.  Anxiety:  Worsening.  Has had 8 panic attacks in space of a month.  Describes feelings of impending doom, difficulty breathing, occasional chest pain that quickly resolves, sweatiness.  Only improves if she can rest or leave situation.  Attacks last 15 - 30 minutes.  Still feels overwhelmed from caring for mother, working, being denied entrance to school as she does not qualify for full financial assistance.  Feels some time away from work would be beneficial, but not a permanent solution.  Is looking for a well-paying job.  Has FMLA paperwork.  No suicidal/homicidal ideations.  Does not want any long-term medications.  Is currently looking for counseling help on her own.    Review of Systems See HPI above for review of systems.       Objective:   Physical Exam Gen:  Alert, cooperative patient who appears stated age in no acute distress.  Vital signs reviewed.  Tearful Psych:  Anxious appearing, somewhat depressed as well        Assessment & Plan:

## 2011-01-09 ENCOUNTER — Telehealth: Payer: Self-pay | Admitting: Family Medicine

## 2011-01-09 NOTE — Telephone Encounter (Signed)
Completed and placed in "to be called" box.

## 2011-01-09 NOTE — Telephone Encounter (Signed)
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  It was in fact due and they have now denied her claim, but she needs to turn it today for reconsideration.  It can be faxed to [facility] at [phone].  She would like a call when this has been completed to her cell #.

## 2011-01-09 NOTE — Telephone Encounter (Signed)
Spoke with patient and faxed form.  She will come by and pick up.

## 2011-01-10 ENCOUNTER — Telehealth: Payer: Self-pay | Admitting: Family Medicine

## 2011-01-10 NOTE — Telephone Encounter (Signed)
There were some parts of the FMLA form that were not completed correctly and she wants to go over that with Dr. Gwendolyn Grant.  They have been denied and she has been told that she can get it corrected and refile.

## 2011-01-11 NOTE — Telephone Encounter (Signed)
Pt called again and wants to talk to [facility] asap

## 2011-01-16 NOTE — Telephone Encounter (Signed)
I was out of town last week on vacation.  Is there anyway someone can call her to talk to her about this?

## 2011-01-16 NOTE — Telephone Encounter (Signed)
[name] is calling back because she still hasn't heard anything on her FMLA papers.  She is about to lose her job unless these get done by tomorrow.  If she needs to make an appt to be seen, she can, she just needs to know.

## 2011-01-16 NOTE — Telephone Encounter (Signed)
Spoke with Archie Patten, asked her to call [place] to find out what needs to be changed.  I can review this form once it has been returned to [place].

## 2011-01-16 NOTE — Telephone Encounter (Signed)
This has been forwarded to pt's pcp.Brooke Marks

## 2011-01-17 NOTE — Telephone Encounter (Signed)
Completed yesterday and placed in "to be called" box.  I just checked, it is still there.  Have spoken to [place] about this.

## 2011-01-17 NOTE — Telephone Encounter (Signed)
Spoke with pt and she stated that she will need the FMLA paperwork completed quickly because she has to turn it in by tomorrow. I will forward this to pcp.

## 2011-02-13 LAB — URINE MICROSCOPIC-ADD ON

## 2011-02-13 LAB — WET PREP, GENITAL
Trich, Wet Prep: NONE SEEN
Yeast Wet Prep HPF POC: NONE SEEN

## 2011-02-13 LAB — GC/CHLAMYDIA PROBE AMP, GENITAL
Chlamydia, DNA Probe: NEGATIVE
GC Probe Amp, Genital: POSITIVE — AB

## 2011-02-13 LAB — URINALYSIS, ROUTINE W REFLEX MICROSCOPIC
Glucose, UA: NEGATIVE
Ketones, ur: NEGATIVE
Nitrite: NEGATIVE
Protein, ur: NEGATIVE
Specific Gravity, Urine: 1.02
Urobilinogen, UA: 0.2
pH: 7

## 2011-02-13 LAB — POCT PREGNANCY, URINE
Operator id: 25114
Preg Test, Ur: NEGATIVE

## 2011-02-24 LAB — COMPREHENSIVE METABOLIC PANEL
ALT: 11
Calcium: 8.3 — ABNORMAL LOW
GFR calc Af Amer: >60
Glucose, Bld: 107 — ABNORMAL HIGH
Sodium: 137
Total Protein: 6.5

## 2011-02-24 LAB — POCT CARDIAC MARKERS
CKMB, poc: <1 ng/mL — ABNORMAL LOW (ref 1.0–8.0)
Myoglobin, poc: 45.2 ng/mL (ref 12–200)
Troponin i, poc: <0.05 ng/mL (ref 0.00–0.09)

## 2011-02-24 LAB — BASIC METABOLIC PANEL
BUN: 12 mg/dL (ref 6–23)
CO2: 25 mEq/L (ref 19–32)
Calcium: 8.7 mg/dL (ref 8.4–10.5)
Chloride: 105 mEq/L (ref 96–112)
Creatinine, Ser: 0.69 mg/dL (ref 0.4–1.2)
GFR calc Af Amer: >60 mL/min (ref >60)
GFR calc non Af Amer: >60 mL/min (ref >60)
Glucose, Bld: 80 mg/dL (ref 70–99)
Potassium: 3.4 mEq/L — ABNORMAL LOW (ref 3.5–5.1)
Sodium: 135 mEq/L (ref 135–145)

## 2011-02-24 LAB — COMPREHENSIVE METABOLIC PANEL WITH GFR
AST: 15
Albumin: 3.1 — ABNORMAL LOW
Alkaline Phosphatase: 48
BUN: 7
CO2: 27
Chloride: 105
Creatinine, Ser: 0.58
GFR calc non Af Amer: >60
Potassium: 3 — ABNORMAL LOW
Total Bilirubin: 0.5

## 2011-02-24 LAB — GC/CHLAMYDIA PROBE AMP, GENITAL
Chlamydia, DNA Probe: POSITIVE — AB
GC Probe Amp, Genital: POSITIVE — AB

## 2011-02-24 LAB — CBC
HCT: 36.1
Hemoglobin: 12
MCHC: 33.1
MCV: 83
Platelets: 331
RBC: 4.35
RDW: 14.4
WBC: 11.9 — ABNORMAL HIGH

## 2011-02-24 LAB — WET PREP, GENITAL
Trich, Wet Prep: NONE SEEN
Yeast Wet Prep HPF POC: NONE SEEN

## 2011-02-24 LAB — URINALYSIS, ROUTINE W REFLEX MICROSCOPIC
Glucose, UA: NEGATIVE
Hgb urine dipstick: NEGATIVE
Ketones, ur: 40 — AB
Nitrite: POSITIVE — AB
Protein, ur: 30 — AB
Specific Gravity, Urine: 1.033 — ABNORMAL HIGH
Urobilinogen, UA: >8 — ABNORMAL HIGH
pH: 5

## 2011-02-24 LAB — DIFFERENTIAL
Basophils Absolute: 0
Basophils Relative: 0
Eosinophils Absolute: 0.1
Eosinophils Relative: 1
Lymphocytes Relative: 23
Lymphs Abs: 2.8
Monocytes Absolute: 0.7
Monocytes Relative: 6
Neutro Abs: 8.3 — ABNORMAL HIGH
Neutrophils Relative %: 70

## 2011-02-24 LAB — URINE MICROSCOPIC-ADD ON

## 2011-02-24 LAB — RAPID URINE DRUG SCREEN, HOSP PERFORMED
Amphetamines: NOT DETECTED
Barbiturates: NOT DETECTED
Benzodiazepines: NOT DETECTED
Cocaine: NOT DETECTED
Opiates: NOT DETECTED
Tetrahydrocannabinol: NOT DETECTED

## 2011-02-24 LAB — LIPASE, BLOOD: Lipase: 17

## 2011-02-24 LAB — RPR: RPR Ser Ql: NONREACTIVE

## 2011-02-24 LAB — PREGNANCY, URINE: Preg Test, Ur: NEGATIVE

## 2011-07-24 ENCOUNTER — Ambulatory Visit (INDEPENDENT_AMBULATORY_CARE_PROVIDER_SITE_OTHER): Payer: Self-pay | Admitting: Family Medicine

## 2011-07-24 ENCOUNTER — Encounter: Payer: Self-pay | Admitting: Family Medicine

## 2011-07-24 DIAGNOSIS — R109 Unspecified abdominal pain: Secondary | ICD-10-CM | POA: Insufficient documentation

## 2011-07-24 DIAGNOSIS — K5900 Constipation, unspecified: Secondary | ICD-10-CM | POA: Insufficient documentation

## 2011-07-24 MED ORDER — POLYETHYLENE GLYCOL 3350 17 GM/SCOOP PO POWD: 17.0000 g | Freq: Two times a day (BID) | ORAL | Status: AC | PRN

## 2011-07-24 MED ORDER — DOCUSATE SODIUM 100 MG PO CAPS: 100.0000 mg | ORAL_CAPSULE | Freq: Two times a day (BID) | ORAL | Status: AC

## 2011-07-24 MED ORDER — OMEPRAZOLE 40 MG PO CPDR: 40.0000 mg | DELAYED_RELEASE_CAPSULE | Freq: Every day | ORAL | Status: DC

## 2011-07-24 MED ORDER — ONDANSETRON 4 MG PO TBDP: 4.0000 mg | ORAL_TABLET | Freq: Three times a day (TID) | ORAL | Status: AC | PRN

## 2011-07-24 NOTE — Assessment & Plan Note (Signed)
Likely secondary to constipation. Possibly also recurrence of GERD symptoms and she has not been on Prilosec for several months. Increasing to prescription strength Prilosec today. Followup in one week.

## 2011-07-24 NOTE — Assessment & Plan Note (Signed)
See instructions. Followup in one week for improvement. No red flags based on history and physical.

## 2011-07-24 NOTE — Progress Notes (Signed)
  Subjective:    Patient ID: Brooke Marks, female    DOB: 26-Nov-1980, 31 y.o.   MRN: [id number]  HPI  #1. Abdominal pain: Patient states that she is beginning to experience abdominal pain that started on Thursday. She describes this as burning in her epigastrium. Not really affected by food. She was on Prilosec before but hasn't taken this for several months. She also some nausea. Please see below. No fevers or chills.  #2. Constipation: Patient states that she is started eating more than usual but is better spirits and constipation. She states she did take a laxative last Tuesday (about one week ago) and had a loose bowel movement. However she did not have another bowel movement until following Sunday when she did take another laxative. She states she feels "full" and becomes nauseous when she just eat or drink anything. She has not had any today or last night. She states she is drinking plenty of water daily. No history of surgery.  Review of Systems See HPI above for review of systems.       Objective:   Physical Exam  Gen:  Alert, cooperative patient who appears stated age in no acute distress.  Vital signs reviewed. Cardiac:  Regular rate and rhythm without murmur auscultated.  Good S1/S2. Pulm:  Clear to auscultation bilaterally with good air movement.  No wheezes or rales noted.   Abdomen:  Soft. Somewhat distended. Tender to palpation diffusely throughout abdomen. More tender in the epigastric area. No guarding or rebound. I am able to palpate a large stool burden left lower and left upper quadrants of her abdomen.      Assessment & Plan:

## 2011-07-24 NOTE — Patient Instructions (Signed)
Take the Zofran up to every 8 hours if you need it for nausea.    Take the prescription strength Prilosec daily.    For your constipation, we'll need to try several ways to treat this:  1)  First, take a stool softener twice daily for the next week.  After that, just take it once daily. 2) Take either Miralax or prune juice once in the AM and once in the PM for the next week as well, but stop taking it if you have diarrhea.  You can increase this to 3 times a day to help you go as well. 3) If you still haven't gone to the restroom after that, try a suppository once a day.  This should make you go.  Your goal is to have a soft bowel movement once daily. Once you start going to the bathroom, cut back to once or twice daily Miralax/prune until you achieve that goal.

## 2011-09-08 ENCOUNTER — Ambulatory Visit (INDEPENDENT_AMBULATORY_CARE_PROVIDER_SITE_OTHER): Payer: BC Managed Care – PPO | Admitting: Family Medicine

## 2011-09-08 ENCOUNTER — Encounter: Payer: Self-pay | Admitting: Family Medicine

## 2011-09-08 ENCOUNTER — Other Ambulatory Visit (HOSPITAL_COMMUNITY)
Admission: RE | Admit: 2011-09-08 | Discharge: 2011-09-08 | Disposition: A | Discharge status: Home / Self Care | Payer: BC Managed Care – PPO | Source: Ambulatory Visit | Attending: Family Medicine | Admitting: Family Medicine

## 2011-09-08 VITALS — BP 130/88 | HR 93 | Temp 98.9°F | Ht 62.75 in | Wt 179.0 lb

## 2011-09-08 DIAGNOSIS — Z113 Encounter for screening for infections with a predominantly sexual mode of transmission: Secondary | ICD-10-CM | POA: Insufficient documentation

## 2011-09-08 DIAGNOSIS — R300 Dysuria: Secondary | ICD-10-CM

## 2011-09-08 DIAGNOSIS — N898 Other specified noninflammatory disorders of vagina: Secondary | ICD-10-CM

## 2011-09-08 LAB — POCT URINALYSIS DIPSTICK
Bilirubin, UA: NEGATIVE
Ketones, UA: NEGATIVE

## 2011-09-08 LAB — POCT UA - MICROSCOPIC ONLY: Epithelial cells, urine per micros: >20

## 2011-09-08 LAB — POCT WET PREP (WET MOUNT): WBC, Wet Prep HPF POC: >20

## 2011-09-08 MED ORDER — METRONIDAZOLE 500 MG PO TABS: 500.0000 mg | ORAL_TABLET | Freq: Two times a day (BID) | ORAL | Status: AC

## 2011-09-08 MED ORDER — FLUCONAZOLE 150 MG PO TABS: 150.0000 mg | ORAL_TABLET | Freq: Once | ORAL | Status: AC

## 2011-09-08 NOTE — Assessment & Plan Note (Signed)
Patient has both bacterial vaginosis and a yeast infection. I suspect the yeast infection is the most symptomatic issue. However I will treat with both fluconazole and metronidazole. The urinalysis is extremely contaminated with squamous epithelial cells and I suspect is largely as result of her vaginal discharge. If her symptoms persist would recommend treatment for urinary tract infection. Discussed warning signs or symptoms. Followup as needed.

## 2011-09-08 NOTE — Patient Instructions (Signed)
Thank you for coming in today. Take the fluconazole once.  Repeat it in 1 week if you still are symptomatic. Let me know if you do not get better and I will call in an antibiotic for a possible urinary tract infection.

## 2011-09-08 NOTE — Progress Notes (Signed)
Brooke Marks is a 32 y.o. female who presents to [facility] today for left lower quite or pain associated with urinary urgency and vaginal discharge for approximately 1 week. No fevers chills significant abdominal pain diarrhea nausea or vomiting. She feels well otherwise without any other complaints.   PMH, SH reviewed: Significant for history of herpes ROS as above otherwise neg. No Chest pain, palpitations, SOB, Fever, Chills, Abd pain, N/V/D.  Medications reviewed. Current Outpatient Prescriptions  Medication Sig Dispense Refill  . fluconazole (DIFLUCAN) 150 MG tablet Take 1 tablet (150 mg total) by mouth once.  1 tablet  1  . LORazepam (ATIVAN) 1 MG tablet Take 1 tablet (1 mg total) by mouth every 8 (eight) hours as needed. Or anxiety  30 tablet  1  . omeprazole (PRILOSEC) 40 MG capsule Take 1 capsule (40 mg total) by mouth daily.  30 capsule  3  . valACYclovir (VALTREX) 500 MG tablet Take 500 mg by mouth daily.          Exam:  BP 130/88  Pulse 93  Temp(Src) 98.9 F (37.2 C) (Oral)  Ht 5' 2.75" (1.594 m)  Wt 179 lb (81.194 kg)  BMI 31.96 kg/m2 Gen: Well NAD Lungs: CTABL Nl WOB Heart: RRR no MRG Abd: NABS, NT, ND no costovertebral angle tenderness to percussion GYN: Normal external genitalia. The clitoris piercing.  Normal vaginal canal.  Normal cervix. Thick white curd appearing vaginal discharge present.  Results for orders placed in visit on 09/08/11 (from the past 72 hour(s))  POCT URINALYSIS DIPSTICK     Status: Abnormal   Collection Time   09/08/11  9:22 AM      Component Value Range Comment   Color, UA YELLOW      Clarity, UA CLEAR      Glucose, UA NEG      Bilirubin, UA NEG      Ketones, UA NEG      Spec Grav, UA 1.025      Blood, UA TRACE-INTACT      pH, UA 6.0      Protein, UA NEG      Urobilinogen, UA 0.2      Nitrite, UA NEG      Leukocytes, UA moderate (2+)     POCT WET PREP (WET MOUNT)     Status: Abnormal   Collection Time   09/08/11  9:22 AM   Component Value Range Comment   Source Wet Prep POC VAG      WBC, Wet Prep HPF POC >20      Bacteria Wet Prep HPF POC 3+ COCCI & RODS      Clue Cells Wet Prep HPF POC Moderate      CLUE CELLS WET PREP WHIFF POC Positive Whiff      Yeast Wet Prep HPF POC Many      Trichomonas Wet Prep HPF POC NONE     POCT UA - MICROSCOPIC ONLY     Status: Abnormal   Collection Time   09/08/11  9:22 AM      Component Value Range Comment   WBC, Ur, HPF, POC 5-15      RBC, urine, microscopic OCC      Bacteria, U Microscopic 2+ RODS & COCCI      Mucus, UA 2+      Epithelial cells, urine per micros >20 with clue cells      Yeast, UA MODERATE

## 2011-09-11 ENCOUNTER — Telehealth: Payer: Self-pay | Admitting: Family Medicine

## 2011-09-11 NOTE — Telephone Encounter (Signed)
Pt is asking for results of her test on Friday

## 2011-09-11 NOTE — Telephone Encounter (Signed)
I did not see her on Friday but read the note and it seems like she had BV and yeast.  It also seems like she is being treated adequately for each.

## 2011-09-11 NOTE — Telephone Encounter (Signed)
Patient informed. 

## 2011-09-11 NOTE — Telephone Encounter (Signed)
Will route to nursing staff.

## 2011-09-12 ENCOUNTER — Telehealth: Payer: Self-pay | Admitting: Family Medicine

## 2011-09-12 NOTE — Telephone Encounter (Signed)
Called about test results. Left a message.

## 2012-07-15 ENCOUNTER — Encounter: Payer: Self-pay | Admitting: Family Medicine

## 2012-07-15 ENCOUNTER — Ambulatory Visit (INDEPENDENT_AMBULATORY_CARE_PROVIDER_SITE_OTHER): Payer: BC Managed Care – PPO | Admitting: Family Medicine

## 2012-07-15 VITALS — BP 134/88 | HR 87 | Temp 98.1°F | Ht 62.0 in | Wt 176.6 lb

## 2012-07-15 DIAGNOSIS — E669 Obesity, unspecified: Secondary | ICD-10-CM

## 2012-07-15 NOTE — Progress Notes (Signed)
Subjective:     Patient ID: Brooke Marks, female   DOB: 1980/11/25, 32 y.o.   MRN: [id number]  HPI Obesity/Weight Loss advice: Deidre is a new patient to me, she has been seen at this clinic prior. Her weight has ranged from 163-180 lbs. over the past few years according to old records. BMI today is 32.3 (Obese). She desires an appetite suppressant, stating she can not stop herself from eating well. Her mother cooks and eats large portions, she eats as much as her mother. She is discourage because her mother is "small" and she gains weight when she eats. 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 She use to walk, but has stopped. She admits to eating poorly and making poor choices, but states she "just can't help it." She drinks only soda, eats fast food frequently throughout the week. She admits to over-eating and eating fried foods at home frequently because her mother cooks the meals.   Review of Systems  Constitutional: Negative for fatigue.  Respiratory: Negative for shortness of breath.   Endocrine: Negative for cold intolerance, polydipsia, polyphagia and polyuria.  All other systems reviewed and are negative.        Objective:   Physical Exam BP 134/88  Pulse 87  Temp(Src) 98.1 F (36.7 C) (Oral)  Ht 5\' 2"  (1.575 m)  Wt 176 lb 9.6 oz (80.105 kg)  BMI 32.29 kg/m2 Gen: Pleasant female. Slightly Obese. Wearing large T-Shirt and sweat pants. Make-up fully applied CV: RRR. No murmur Lungs: CTAB Psych: Mood and affect normal. Pt looks uncomfortable with her body. Her behavioral is normal as is  Her thought content. She seems aware of what needs to be accomplished to lose weight and be healthy, but does not have the will power to accomplish it.

## 2012-07-15 NOTE — Patient Instructions (Addendum)
It was nice to meet you today. I am excited you are desiring to become more healthy by exercising and good diet choices. I will work with you to help you as much as I can, but as discussed this is ultimately up to you make the choices that fit into your life style.  I would like to see you again in 1 month, in that time I would like you to work on the following: - Exercise x3 a week for 30 minutes with a target heart rate of 165 - No soda, add water to diet of 80 ounces a day.  - Attempt to cut out fried food and eat more veggies.  - target weight loss of 4-6 pounds.   Calorie Counting Diet A calorie counting diet requires you to eat the number of calories that are right for you in a day. Calories are the measurement of how much energy you get from the food you eat. Eating the right amount of calories is important for staying at a healthy weight. If you eat too many calories, your body will store them as fat and you may gain weight. If you eat too few calories, you may lose weight. Counting the number of calories you eat during a day will help you know if you are eating the right amount. A Registered Dietitian can determine how many calories you need in a day. The amount of calories needed varies from person to person. If your goal is to lose weight, you will need to eat fewer calories. Losing weight can benefit you if you are overweight or have health problems such as heart disease, high blood pressure, or diabetes. If your goal is to gain weight, you will need to eat more calories. Gaining weight may be necessary if you have a certain health problem that causes your body to need more energy. TIPS Whether you are increasing or decreasing the number of calories you eat during a day, it may be hard to get used to changes in what you eat and drink. The following are tips to help you keep track of the number of calories you eat.  Measure foods at home with measuring cups. This helps you know the amount  of food and number of calories you are eating.  Restaurants often serve food in amounts that are larger than 1 serving. While eating out, estimate how many servings of a food you are given. For example, a serving of cooked rice is  cup or about the size of half of a fist. Knowing serving sizes will help you be aware of how much food you are eating at restaurants.  Ask for smaller portion sizes or child-size portions at restaurants.  Plan to eat half of a meal at a restaurant. Take the rest home or share the other half with a friend.  Read the Nutrition Facts panel on food labels for calorie content and serving size. You can find out how many servings are in a package, the size of a serving, and the number of calories each serving has.  For example, a package might contain 3 cookies. The Nutrition Facts panel on that package says that 1 serving is 1 cookie. Below that, it will say there are 3 servings in the container. The calories section of the Nutrition Facts label says there are 90 calories. This means there are 90 calories in 1 cookie (1 serving). If you eat 1 cookie you have eaten 90 calories. If you eat all 3  cookies, you have eaten 270 calories (3 servings x 90 calories = 270 calories). The list below tells you how big or small some common portion sizes are.  1 oz.........4 stacked dice.  3 oz........Marland KitchenDeck of cards.  1 tsp.......Marland KitchenTip of little finger.  1 tbs......Marland KitchenMarland KitchenThumb.  2 tbs.......Marland KitchenGolf ball.   cup......Marland KitchenHalf of a fist.  1 cup.......Marland KitchenA fist. KEEP A FOOD LOG Write down every food item you eat, the amount you eat, and the number of calories in each food you eat during the day. At the end of the day, you can add up the total number of calories you have eaten. It may help to keep a list like the one below. Find out the calorie information by reading the Nutrition Facts panel on food labels. Breakfast  Bran cereal (1 cup, 110 calories).  Fat-free milk ( cup, 45  calories). Snack  Apple (1 medium, 80 calories). Lunch  Spinach (1 cup, 20 calories).  Tomato ( medium, 20 calories).  Chicken breast strips (3 oz, 165 calories).  Shredded cheddar cheese ( cup, 110 calories).  Light [place] dressing (2 tbs, 60 calories).  Whole-wheat bread (1 slice, 80 calories).  Tub margarine (1 tsp, 35 calories).  Vegetable soup (1 cup, 160 calories). Dinner  Pork chop (3 oz, 190 calories).  Brown rice (1 cup, 215 calories).  Steamed broccoli ( cup, 20 calories).  Strawberries (1  cup, 65 calories).  Whipped cream (1 tbs, 50 calories). Daily Calorie Total: 1425 Document Released: 05/08/2005 Document Revised: 07/31/2011 Document Reviewed: 11/02/2006 [facility] Patient Information 2013 [place], [place].  Exercise to Lose Weight Exercise and a healthy diet may help you lose weight. Your doctor may suggest specific exercises. EXERCISE IDEAS AND TIPS  Choose low-cost things you enjoy doing, such as walking, bicycling, or exercising to workout videos.  Take stairs instead of the elevator.  Walk during your lunch break.  Park your car further away from work or school.  Go to a gym or an exercise class.  Start with 5 to 10 minutes of exercise each day. Build up to 30 minutes of exercise 4 to 6 days a week.  Wear shoes with good support and comfortable clothes.  Stretch before and after working out.  Work out until you breathe harder and your heart beats faster.  Drink extra water when you exercise.  Do not do so much that you hurt yourself, feel dizzy, or get very short of breath. Exercises that burn about 150 calories:  Running 1  miles in 15 minutes.  Playing volleyball for 45 to 60 minutes.  Washing and waxing a car for 45 to 60 minutes.  Playing touch football for 45 minutes.  Walking 1  miles in 35 minutes.  Pushing a stroller 1  miles in 30 minutes.  Playing basketball for 30 minutes.  Raking leaves for 30  minutes.  Bicycling 5 miles in 30 minutes.  Walking 2 miles in 30 minutes.  Dancing for 30 minutes.  Shoveling snow for 15 minutes.  Swimming laps for 20 minutes.  Walking up stairs for 15 minutes.  Bicycling 4 miles in 15 minutes.  Gardening for 30 to 45 minutes.  Jumping rope for 15 minutes.  Washing windows or floors for 45 to 60 minutes. Document Released: 06/10/2010 Document Revised: 07/31/2011 Document Reviewed: 06/10/2010 [facility] Patient Information 2013 [place], [place].

## 2012-07-15 NOTE — Assessment & Plan Note (Addendum)
-   BMI 32.3 today - discussed in length healthy lifestyle,  behavior modification, diet and exercise   - Discourage pt against appetite suppressants - Goals for next visit:  - Set healthy lifestyle and behavior that will last over time, not "diet"  - Delete Soda and [employer] from diet  - Add 80 ounces of water/d and more veggies  - Exercise x3/w with target heart rate 165--> She needs to pick an exercise she can tolerate - AVS on counting calories and exercise  - 4-6 lbs weight loss over next month--> f/u 1 mo.

## 2012-08-09 ENCOUNTER — Ambulatory Visit: Payer: BC Managed Care – PPO | Admitting: Family Medicine

## 2012-08-28 ENCOUNTER — Ambulatory Visit: Payer: BC Managed Care – PPO | Admitting: Family Medicine

## 2013-12-24 ENCOUNTER — Encounter: Payer: Self-pay | Admitting: *Deleted

## 2013-12-24 ENCOUNTER — Ambulatory Visit (INDEPENDENT_AMBULATORY_CARE_PROVIDER_SITE_OTHER): Payer: BC Managed Care – PPO | Admitting: *Deleted

## 2013-12-24 DIAGNOSIS — Z7185 Encounter for immunization safety counseling: Secondary | ICD-10-CM

## 2013-12-24 DIAGNOSIS — Z7189 Other specified counseling: Secondary | ICD-10-CM

## 2013-12-24 NOTE — Progress Notes (Signed)
   Pt in nurse clinic for Tdap.  Tdap out of stock today.  Pt stated she needed this vaccine ASAP.  Pt advised she could go to pharmacy as walk-in for tdap.  Pt given copy of immunization record.  Derl Barrow, RN

## 2014-01-14 ENCOUNTER — Inpatient Hospital Stay (HOSPITAL_COMMUNITY)
Admission: AD | Admit: 2014-01-14 | Discharge: 2014-01-14 | Disposition: A | Discharge status: Home / Self Care | Payer: BC Managed Care – PPO | Source: Ambulatory Visit | Attending: Obstetrics & Gynecology | Admitting: Obstetrics & Gynecology

## 2014-01-14 ENCOUNTER — Encounter (HOSPITAL_COMMUNITY): Payer: Self-pay

## 2014-01-14 ENCOUNTER — Inpatient Hospital Stay (HOSPITAL_COMMUNITY): Payer: BC Managed Care – PPO

## 2014-01-14 DIAGNOSIS — N730 Acute parametritis and pelvic cellulitis: Secondary | ICD-10-CM | POA: Diagnosis not present

## 2014-01-14 DIAGNOSIS — N739 Female pelvic inflammatory disease, unspecified: Secondary | ICD-10-CM | POA: Insufficient documentation

## 2014-01-14 DIAGNOSIS — R109 Unspecified abdominal pain: Secondary | ICD-10-CM | POA: Insufficient documentation

## 2014-01-14 DIAGNOSIS — K219 Gastro-esophageal reflux disease without esophagitis: Secondary | ICD-10-CM | POA: Insufficient documentation

## 2014-01-14 DIAGNOSIS — N949 Unspecified condition associated with female genital organs and menstrual cycle: Secondary | ICD-10-CM | POA: Diagnosis not present

## 2014-01-14 HISTORY — DX: Anxiety disorder, unspecified: F41.9

## 2014-01-14 HISTORY — DX: Major depressive disorder, single episode, unspecified: F32.9

## 2014-01-14 HISTORY — DX: Depression, unspecified: F32.A

## 2014-01-14 LAB — WET PREP, GENITAL
Trich, Wet Prep: NONE SEEN
Yeast Wet Prep HPF POC: NONE SEEN

## 2014-01-14 LAB — COMPREHENSIVE METABOLIC PANEL
ALK PHOS: 52 U/L (ref 39–117)
ALT: 6 U/L (ref 0–35)
ANION GAP: 10 (ref 5–15)
AST: 11 U/L (ref 0–37)
Albumin: 3.9 g/dL (ref 3.5–5.2)
BUN: 8 mg/dL (ref 6–23)
CALCIUM: 8.9 mg/dL (ref 8.4–10.5)
CO2: 28 meq/L (ref 19–32)
Chloride: 101 mEq/L (ref 96–112)
Creatinine, Ser: 0.54 mg/dL (ref 0.50–1.10)
GLUCOSE: 87 mg/dL (ref 70–99)
POTASSIUM: 4 meq/L (ref 3.7–5.3)
SODIUM: 139 meq/L (ref 137–147)
Total Bilirubin: 0.5 mg/dL (ref 0.3–1.2)
Total Protein: 7.8 g/dL (ref 6.0–8.3)

## 2014-01-14 LAB — URINALYSIS, ROUTINE W REFLEX MICROSCOPIC
BILIRUBIN URINE: NEGATIVE
GLUCOSE, UA: NEGATIVE mg/dL
HGB URINE DIPSTICK: NEGATIVE
KETONES UR: NEGATIVE mg/dL
Leukocytes, UA: NEGATIVE
NITRITE: NEGATIVE
PH: 7 (ref 5.0–8.0)
Protein, ur: NEGATIVE mg/dL
SPECIFIC GRAVITY, URINE: 1.02 (ref 1.005–1.030)
Urobilinogen, UA: 1 mg/dL (ref 0.0–1.0)

## 2014-01-14 LAB — CBC WITH DIFFERENTIAL/PLATELET
Basophils Absolute: 0 10*3/uL (ref 0.0–0.1)
Basophils Relative: 1 % (ref 0–1)
EOS PCT: 2 % (ref 0–5)
Eosinophils Absolute: 0.1 10*3/uL (ref 0.0–0.7)
HCT: 39.5 % (ref 36.0–46.0)
HEMOGLOBIN: 13.4 g/dL (ref 12.0–15.0)
LYMPHS ABS: 3.6 10*3/uL (ref 0.7–4.0)
LYMPHS PCT: 42 % (ref 12–46)
MCH: 28.4 pg (ref 26.0–34.0)
MCHC: 33.9 g/dL (ref 30.0–36.0)
MCV: 83.7 fL (ref 78.0–100.0)
MONOS PCT: 8 % (ref 3–12)
Monocytes Absolute: 0.7 10*3/uL (ref 0.1–1.0)
Neutro Abs: 4.1 10*3/uL (ref 1.7–7.7)
Neutrophils Relative %: 47 % (ref 43–77)
PLATELETS: 339 10*3/uL (ref 150–400)
RBC: 4.72 MIL/uL (ref 3.87–5.11)
RDW: 13.7 % (ref 11.5–15.5)
WBC: 8.5 10*3/uL (ref 4.0–10.5)

## 2014-01-14 LAB — POCT PREGNANCY, URINE: Preg Test, Ur: NEGATIVE

## 2014-01-14 MED ORDER — METRONIDAZOLE 500 MG PO TABS: 500.0000 mg | ORAL_TABLET | Freq: Two times a day (BID) | ORAL | Status: DC

## 2014-01-14 MED ORDER — CEFTRIAXONE SODIUM 250 MG IJ SOLR
250.0000 mg | Freq: Once | INTRAMUSCULAR | Status: AC
  Administered 2014-01-14: 250 mg via INTRAMUSCULAR
  Filled 2014-01-14: qty 250

## 2014-01-14 MED ORDER — AZITHROMYCIN 250 MG PO TABS
1000.0000 mg | ORAL_TABLET | Freq: Once | ORAL | Status: AC
  Administered 2014-01-14: 1000 mg via ORAL
  Filled 2014-01-14: qty 4

## 2014-01-14 MED ORDER — OMEPRAZOLE 20 MG PO CPDR: 20.0000 mg | DELAYED_RELEASE_CAPSULE | Freq: Every day | ORAL | Status: DC

## 2014-01-14 MED ORDER — DOXYCYCLINE HYCLATE 50 MG PO CAPS
50.0000 mg | ORAL_CAPSULE | Freq: Two times a day (BID) | ORAL | Status: DC
Start: 2014-01-14 — End: 2014-02-16

## 2014-01-14 MED ORDER — FAMOTIDINE 20 MG PO TABS
20.0000 mg | ORAL_TABLET | Freq: Once | ORAL | Status: AC
  Administered 2014-01-14: 20 mg via ORAL
  Filled 2014-01-14: qty 1

## 2014-01-14 NOTE — MAU Note (Signed)
Patient states  her last period started on 8-18 and was heavier and more painful that usual. Stopped on 8-23 the started having abdominal and vaginal pain again on 8-24. Feels short of breath with the pain. Denies bleeding or discharge.

## 2014-01-14 NOTE — Discharge Instructions (Signed)
Pelvic Inflammatory Disease Pelvic inflammatory disease (PID) is an infection in some or all of the female organs. PID can be in the uterus, ovaries, fallopian tubes, or the surrounding tissues inside the lower belly area (pelvis). HOME CARE   If given, take your antibiotic medicine as told. Finish them even if you start to feel better.  Only take medicine as told by your doctor.  Do not have sex (intercourse) until treatment is done or as told by your doctor.  Tell your sex partner if you have PID. Your partner may need to be treated.  Keep all doctor visits. GET HELP RIGHT AWAY IF:   You have a fever.  You have more belly (abdominal) or lower belly pain.  You have chills.  You have pain when you pee (urinate).  You are not better after 72 hours.  You have more fluid (discharge) coming from your vagina or fluid that is not normal.  You need pain medicine from your doctor.  You throw up (vomit).  You cannot take your medicines.  Your partner has a sexually transmitted disease (STD). MAKE SURE YOU:   Understand these instructions.  Will watch your condition.  Will get help right away if you are not doing well or get worse. Document Released: 08/04/2008 Document Revised: 09/02/2012 Document Reviewed: 05/04/2011 [facility] Patient Information 2015 [place], [place]. This information is not intended to replace advice given to you by your health care provider. Make sure you discuss any questions you have with your health care provider. Food Choices for Gastroesophageal Reflux Disease When you have gastroesophageal reflux disease (GERD), the foods you eat and your eating habits are very important. Choosing the right foods can help ease the discomfort of GERD. WHAT GENERAL GUIDELINES DO I NEED TO FOLLOW?  Choose fruits, vegetables, whole grains, low-fat dairy products, and low-fat meat, fish, and poultry.  Limit fats such as oils, salad dressings, butter, nuts, and  avocado.  Keep a food diary to identify foods that cause symptoms.  Avoid foods that cause reflux. These may be different for different people.  Eat frequent small meals instead of three large meals each day.  Eat your meals slowly, in a relaxed setting.  Limit fried foods.  Cook foods using methods other than frying.  Avoid drinking alcohol.  Avoid drinking large amounts of liquids with your meals.  Avoid bending over or lying down until 2-3 hours after eating. WHAT FOODS ARE NOT RECOMMENDED? The following are some foods and drinks that may worsen your symptoms: Vegetables Tomatoes. Tomato juice. Tomato and spaghetti sauce. Chili peppers. Onion and garlic. Horseradish. Fruits Oranges, grapefruit, and lemon (fruit and juice). Meats High-fat meats, fish, and poultry. This includes hot dogs, ribs, ham, sausage, salami, and bacon. Dairy Whole milk and chocolate milk. Sour cream. Cream. Butter. Ice cream. Cream cheese.  Beverages Coffee and tea, with or without caffeine. Carbonated beverages or energy drinks. Condiments Hot sauce. Barbecue sauce.  Sweets/Desserts Chocolate and cocoa. Donuts. Peppermint and spearmint. Fats and Oils High-fat foods, including [place] fries and potato chips. Other Vinegar. Strong spices, such as black pepper, white pepper, red pepper, cayenne, curry powder, cloves, ginger, and chili powder. The items listed above may not be a complete list of foods and beverages to avoid. Contact your dietitian for more information. Document Released: 05/08/2005 Document Revised: 05/13/2013 Document Reviewed: 03/12/2013 [facility] Patient Information 2015 [place], [place]. This information is not intended to replace advice given to you by your health care provider. Make sure you discuss  any questions you have with your health care provider. ° °

## 2014-01-14 NOTE — MAU Provider Note (Signed)
History     CSN: [id number]  Arrival date and time: 01/14/14 1756   First Provider Initiated Contact with Patient 01/14/14 2044      Chief Complaint  Patient presents with  . Abdominal Cramping  . Vaginal Pain  . Shortness of Breath   HPI [name] is a 33 y.o. G2P0020 who presents to MAU today with diffuse abdominal and mid back pain since 01/12/14. She states LMP of 01/06/14 that was heavier than normal. She states period ended on 01/11/14 and then pain became worse on 01/12/14. She is not bleeding today. She states a white discharge with mild odor. She denies N/V/D, fever or UTI symptoms.   OB History   Grav Para Term Preterm Abortions TAB SAB Ect Mult Living   2    2 2     0      Past Medical History  Diagnosis Date  . Anxiety   . Depression     Past Surgical History  Procedure Laterality Date  . No past surgeries      Family History  Problem Relation Age of Onset  . Diabetes Mother   . Hypertension Father     History  Substance Use Topics  . Smoking status: Never Smoker   . Smokeless tobacco: Not on file  . Alcohol Use: Not on file    Allergies: No Known Allergies  No prescriptions prior to admission    Review of Systems  Constitutional: Negative for fever and malaise/fatigue.  Gastrointestinal: Positive for heartburn and abdominal pain. Negative for nausea, vomiting, diarrhea and constipation.  Genitourinary: Negative for dysuria, urgency and frequency.       + vaginal discharge Neg - vaginal bleeding   Physical Exam   Blood pressure 127/91, pulse 89, temperature 98.4 F (36.9 C), temperature source Oral, resp. rate 16, height 5\' 3"  (1.6 m), weight 147 lb 3.2 oz (66.769 kg), last menstrual period 01/06/2014, SpO2 98.00%.  Physical Exam  Constitutional: She is oriented to person, place, and time. She appears well-developed and well-nourished. No distress.  HENT:  Head: Normocephalic.  Cardiovascular: Normal rate.   Respiratory: Effort  normal.  GI: Soft. Bowel sounds are normal. She exhibits no distension and no mass. There is tenderness (mild tenderness to palpation of the epigastric region and pelvic region). There is no rebound, no guarding and no CVA tenderness.  Genitourinary: Uterus is not enlarged and not tender. Cervix exhibits no motion tenderness, no discharge and no friability. Right adnexum displays no mass and no tenderness. Left adnexum displays no mass and no tenderness. No bleeding around the vagina. Vaginal discharge (small amount of thin, white, malorodous discharge noted) found.  Neurological: She is alert and oriented to person, place, and time.  Skin: Skin is warm and dry. No erythema.  Psychiatric: She has a normal mood and affect.   Results for orders placed during the hospital encounter of 01/14/14 (from the past 24 hour(s))  URINALYSIS, ROUTINE W REFLEX MICROSCOPIC     Status: None   Collection Time    01/14/14  6:10 PM      Result Value Ref Range   Color, Urine YELLOW  YELLOW   APPearance CLEAR  CLEAR   Specific Gravity, Urine 1.020  1.005 - 1.030   pH 7.0  5.0 - 8.0   Glucose, UA NEGATIVE  NEGATIVE mg/dL   Hgb urine dipstick NEGATIVE  NEGATIVE   Bilirubin Urine NEGATIVE  NEGATIVE   Ketones, ur NEGATIVE  NEGATIVE mg/dL  Protein, ur NEGATIVE  NEGATIVE mg/dL   Urobilinogen, UA 1.0  0.0 - 1.0 mg/dL   Nitrite NEGATIVE  NEGATIVE   Leukocytes, UA NEGATIVE  NEGATIVE  POCT PREGNANCY, URINE     Status: None   Collection Time    01/14/14  7:09 PM      Result Value Ref Range   Preg Test, Ur NEGATIVE  NEGATIVE  WET PREP, GENITAL     Status: Abnormal   Collection Time    01/14/14  8:52 PM      Result Value Ref Range   Yeast Wet Prep HPF POC NONE SEEN  NONE SEEN   Trich, Wet Prep NONE SEEN  NONE SEEN   Clue Cells Wet Prep HPF POC MODERATE (*) NONE SEEN   WBC, Wet Prep HPF POC FEW (*) NONE SEEN  CBC WITH DIFFERENTIAL     Status: None   Collection Time    01/14/14  8:54 PM      Result Value Ref  Range   WBC 8.5  4.0 - 10.5 K/uL   RBC 4.72  3.87 - 5.11 MIL/uL   Hemoglobin 13.4  12.0 - 15.0 g/dL   HCT 39.5  36.0 - 46.0 %   MCV 83.7  78.0 - 100.0 fL   MCH 28.4  26.0 - 34.0 pg   MCHC 33.9  30.0 - 36.0 g/dL   RDW 13.7  11.5 - 15.5 %   Platelets 339  150 - 400 K/uL   Neutrophils Relative % 47  43 - 77 %   Neutro Abs 4.1  1.7 - 7.7 K/uL   Lymphocytes Relative 42  12 - 46 %   Lymphs Abs 3.6  0.7 - 4.0 K/uL   Monocytes Relative 8  3 - 12 %   Monocytes Absolute 0.7  0.1 - 1.0 K/uL   Eosinophils Relative 2  0 - 5 %   Eosinophils Absolute 0.1  0.0 - 0.7 K/uL   Basophils Relative 1  0 - 1 %   Basophils Absolute 0.0  0.0 - 0.1 K/uL  COMPREHENSIVE METABOLIC PANEL     Status: None   Collection Time    01/14/14  8:54 PM      Result Value Ref Range   Sodium 139  137 - 147 mEq/L   Potassium 4.0  3.7 - 5.3 mEq/L   Chloride 101  96 - 112 mEq/L   CO2 28  19 - 32 mEq/L   Glucose, Bld 87  70 - 99 mg/dL   BUN 8  6 - 23 mg/dL   Creatinine, Ser 0.54  0.50 - 1.10 mg/dL   Calcium 8.9  8.4 - 10.5 mg/dL   Total Protein 7.8  6.0 - 8.3 g/dL   Albumin 3.9  3.5 - 5.2 g/dL   AST 11  0 - 37 U/L   ALT 6  0 - 35 U/L   Alkaline Phosphatase 52  39 - 117 U/L   Total Bilirubin 0.5  0.3 - 1.2 mg/dL   GFR calc non Af Amer >90  >90 mL/min   GFR calc Af Amer >90  >90 mL/min   Anion gap 10  5 - 15   US Transvaginal Non-ob  01/14/2014   CLINICAL DATA:  Pelvic pain.  Menorrhagia.  EXAM: TRANSABDOMINAL ULTRASOUND OF PELVIS  TECHNIQUE: Transabdominal ultrasound examination of the pelvis was performed including evaluation of the uterus, ovaries, adnexal regions, and pelvic cul-de-sac.  COMPARISON:  03/07/2004.  FINDINGS: Uterus  Measurements: 6.7 x  4.3 x 3.3 cm. No fibroids or other mass visualized.  Endometrium  Thickness: 8.4 mm.  No focal abnormality visualized.  Right ovary  Measurements: 4.9 x 2.6 x 2.4 cm. Normal appearance/no adnexal mass.  Left ovary  Measurements: 3.9 x 2.8 x 2.8 cm. Normal appearance/no  adnexal mass.  Other findings: Small amount of free peritoneal fluid with low-level internal echoes. There is also a mildly prominent vascular soft tissue between the ovaries and uterus. This is symmetrical.  IMPRESSION: 1. Small amount of mildly complex free peritoneal fluid. This could be due to hemorrhage or infection. 2. Mildly prominent vascular soft tissue between the ovaries in uterus bilaterally. This could represent prominent, thickened broad ligaments or potentially areas of tubal infection bilaterally.   Electronically Signed   By: Enrique Sack M.D.   On: 01/14/2014 22:26   US Pelvis Complete  01/14/2014   CLINICAL DATA:  Pelvic pain.  Menorrhagia.  EXAM: TRANSABDOMINAL ULTRASOUND OF PELVIS  TECHNIQUE: Transabdominal ultrasound examination of the pelvis was performed including evaluation of the uterus, ovaries, adnexal regions, and pelvic cul-de-sac.  COMPARISON:  03/07/2004.  FINDINGS: Uterus  Measurements: 6.7 x 4.3 x 3.3 cm. No fibroids or other mass visualized.  Endometrium  Thickness: 8.4 mm.  No focal abnormality visualized.  Right ovary  Measurements: 4.9 x 2.6 x 2.4 cm. Normal appearance/no adnexal mass.  Left ovary  Measurements: 3.9 x 2.8 x 2.8 cm. Normal appearance/no adnexal mass.  Other findings: Small amount of free peritoneal fluid with low-level internal echoes. There is also a mildly prominent vascular soft tissue between the ovaries and uterus. This is symmetrical.  IMPRESSION: 1. Small amount of mildly complex free peritoneal fluid. This could be due to hemorrhage or infection. 2. Mildly prominent vascular soft tissue between the ovaries in uterus bilaterally. This could represent prominent, thickened broad ligaments or potentially areas of tubal infection bilaterally.   Electronically Signed   By: Enrique Sack M.D.   On: 01/14/2014 22:26     MAU Course  Procedures None  MDM UPT - negative UA, wet prep, GC/Chlamdydia, CBC, CMP and [place] today IM Rocephin and PO Zithromax given  in MAU  Assessment and Plan  A: GERD PID  P: Discharge home Rx for Prilosec, Flagyl and Doxycycline given to patient Patient advised to follow-up with PCP at [facility] Patient may return to MAU as needed or if her condition were to change or worsen  Farris Has, PA-C  01/14/2014, 11:20 PM

## 2014-01-15 LAB — GC/CHLAMYDIA PROBE AMP
CT Probe RNA: POSITIVE — AB
GC PROBE AMP APTIMA: NEGATIVE

## 2014-01-16 NOTE — MAU Provider Note (Signed)
Attestation of Attending Supervision of Advanced Practitioner: Evaluation and management procedures were performed by the PA/NP/CNM/OB Fellow under my supervision/collaboration. Chart reviewed and agree with management and plan.  Levone Otten V 01/16/2014 2:54 PM

## 2014-02-16 ENCOUNTER — Ambulatory Visit (INDEPENDENT_AMBULATORY_CARE_PROVIDER_SITE_OTHER): Payer: BC Managed Care – PPO | Admitting: Family Medicine

## 2014-02-16 ENCOUNTER — Encounter: Payer: Self-pay | Admitting: Family Medicine

## 2014-02-16 VITALS — BP 119/79 | HR 92 | Temp 98.0°F | Ht 62.0 in | Wt 147.3 lb

## 2014-02-16 DIAGNOSIS — K219 Gastro-esophageal reflux disease without esophagitis: Secondary | ICD-10-CM | POA: Diagnosis not present

## 2014-02-16 MED ORDER — PANTOPRAZOLE SODIUM 40 MG PO TBEC: 40.0000 mg | DELAYED_RELEASE_TABLET | Freq: Every day | ORAL | Status: DC

## 2014-02-16 NOTE — Assessment & Plan Note (Signed)
Start Protonix 40 mg today, f/u with PCP in 4 weeks.  No red flags today, if starts to have, consider GI referral for UGI endoscopy.

## 2014-02-16 NOTE — Patient Instructions (Signed)
Pantoprazole tablets What is this medicine? PANTOPRAZOLE (pan TOE pra zole) prevents the production of acid in the stomach. It is used to treat gastroesophageal reflux disease (GERD), inflammation of the esophagus, and Zollinger-Ellison syndrome. This medicine may be used for other purposes; ask your health care provider or pharmacist if you have questions. COMMON BRAND NAME(S): Protonix What should I tell my health care provider before I take this medicine? They need to know if you have any of these conditions: -liver disease -low levels of magnesium in the blood -an unusual or allergic reaction to omeprazole, lansoprazole, pantoprazole, rabeprazole, other medicines, foods, dyes, or preservatives -pregnant or trying to get pregnant -breast-feeding How should I use this medicine? Take this medicine by mouth. Swallow the tablets whole with a drink of water. Follow the directions on the prescription label. Do not crush, break, or chew. Take your medicine at regular intervals. Do not take your medicine more often than directed. Talk to your pediatrician regarding the use of this medicine in children. While this drug may be prescribed for children as young as 5 years for selected conditions, precautions do apply. Overdosage: If you think you have taken too much of this medicine contact a poison control center or emergency room at once. NOTE: This medicine is only for you. Do not share this medicine with others. What if I miss a dose? If you miss a dose, take it as soon as you can. If it is almost time for your next dose, take only that dose. Do not take double or extra doses. What may interact with this medicine? Do not take this medicine with any of the following medications: -atazanavir -nelfinavir This medicine may also interact with the following medications: -ampicillin -delavirdine -digoxin -diuretics -iron salts -medicines for fungal infections like ketoconazole, itraconazole and  voriconazole -warfarin This list may not describe all possible interactions. Give your health care provider a list of all the medicines, herbs, non-prescription drugs, or dietary supplements you use. Also tell them if you smoke, drink alcohol, or use illegal drugs. Some items may interact with your medicine. What should I watch for while using this medicine? It can take several days before your stomach pain gets better. Check with your doctor or health care professional if your condition does not start to get better, or if it gets worse. You may need blood work done while you are taking this medicine. What side effects may I notice from receiving this medicine? Side effects that you should report to your doctor or health care professional as soon as possible: -allergic reactions like skin rash, itching or hives, swelling of the face, lips, or tongue -bone, muscle or joint pain -breathing problems -chest pain or chest tightness -dark yellow or brown urine -dizziness -fast, irregular heartbeat -feeling faint or lightheaded -fever or sore throat -muscle spasm -palpitations -redness, blistering, peeling or loosening of the skin, including inside the mouth -seizures -tremors -unusual bleeding or bruising -unusually weak or tired -yellowing of the eyes or skin Side effects that usually do not require medical attention (Report these to your doctor or health care professional if they continue or are bothersome.): -constipation -diarrhea -dry mouth -headache -nausea This list may not describe all possible side effects. Call your doctor for medical advice about side effects. You may report side effects to FDA at 1-800-FDA-1088. Where should I keep my medicine? Keep out of the reach of children. Store at room temperature between 15 and 30 degrees C (59 and 86 degrees F). Protect  from light and moisture. Throw away any unused medicine after the expiration date. NOTE: This sheet is a summary.  It may not cover all possible information. If you have questions about this medicine, talk to your doctor, pharmacist, or health care provider.  2015, Elsevier/Gold Standard. (2012-03-06 16:40:16)

## 2014-02-16 NOTE — Progress Notes (Signed)
Brooke Marks is a 33 y.o. female who presents today for Reflux.  Reflux - Started in middle of August (about 1.5 months ago) now.  Dx with GERD, started on Prilosec 20 mg and Zantac 75 mg BID, which helped immensely.  However, she only had a 14 day supply, prior to switching to OTC medications.  Since that point, her Sx have increased including brash, worsening Sx at night/spicy foods.  She denies any CP, hematemesis, emesis, melena, hematochezia, fever, chills, sweats, weight loss, excessive alcohol abuse.   Past Medical History  Diagnosis Date  . Anxiety   . Depression     History  Smoking status  . Never Smoker   Smokeless tobacco  . Not on file    Family History  Problem Relation Age of Onset  . Diabetes Mother   . Hypertension Father     Current Outpatient Prescriptions on File Prior to Visit  Medication Sig Dispense Refill  . doxycycline (VIBRAMYCIN) 50 MG capsule Take 1 capsule (50 mg total) by mouth 2 (two) times daily.  28 capsule  0  . metroNIDAZOLE (FLAGYL) 500 MG tablet Take 1 tablet (500 mg total) by mouth 2 (two) times daily.  14 tablet  0  . omeprazole (PRILOSEC) 20 MG capsule Take 1 capsule (20 mg total) by mouth daily.  14 capsule  0   No current facility-administered medications on file prior to visit.    ROS: Per HPI.  All other systems reviewed and are negative.   Physical Exam Filed Vitals:   02/16/14 1535  BP: 119/79  Pulse: 92  Temp: 98 F (36.7 C)    Physical Examination: General appearance - alert, well appearing, and in no distress Heart: RRR Abdomen - soft, nontender, nondistended, no masses or organomegaly

## 2014-03-23 ENCOUNTER — Encounter: Payer: Self-pay | Admitting: Family Medicine

## 2014-12-25 ENCOUNTER — Other Ambulatory Visit (HOSPITAL_COMMUNITY)
Admission: RE | Admit: 2014-12-25 | Discharge: 2014-12-25 | Disposition: A | Discharge status: Home / Self Care | Payer: Commercial Managed Care - HMO | Source: Ambulatory Visit | Attending: Family Medicine | Admitting: Family Medicine

## 2014-12-25 ENCOUNTER — Encounter: Payer: Self-pay | Admitting: Student

## 2014-12-25 ENCOUNTER — Ambulatory Visit (INDEPENDENT_AMBULATORY_CARE_PROVIDER_SITE_OTHER): Payer: Commercial Managed Care - HMO | Admitting: Student

## 2014-12-25 VITALS — BP 130/97 | HR 86 | Temp 98.0°F | Ht 62.0 in | Wt 176.1 lb

## 2014-12-25 DIAGNOSIS — N943 Premenstrual tension syndrome: Secondary | ICD-10-CM

## 2014-12-25 DIAGNOSIS — Z01419 Encounter for gynecological examination (general) (routine) without abnormal findings: Secondary | ICD-10-CM | POA: Insufficient documentation

## 2014-12-25 DIAGNOSIS — F411 Generalized anxiety disorder: Secondary | ICD-10-CM | POA: Diagnosis not present

## 2014-12-25 DIAGNOSIS — F3281 Premenstrual dysphoric disorder: Secondary | ICD-10-CM

## 2014-12-25 DIAGNOSIS — Z1151 Encounter for screening for human papillomavirus (HPV): Secondary | ICD-10-CM | POA: Insufficient documentation

## 2014-12-25 DIAGNOSIS — F410 Panic disorder [episodic paroxysmal anxiety] without agoraphobia: Secondary | ICD-10-CM | POA: Diagnosis not present

## 2014-12-25 DIAGNOSIS — Z0000 Encounter for general adult medical examination without abnormal findings: Secondary | ICD-10-CM | POA: Diagnosis not present

## 2014-12-25 MED ORDER — FOLIC ACID 400 MCG PO TABS: 400.0000 ug | ORAL_TABLET | Freq: Every day | ORAL | Status: DC

## 2014-12-25 MED ORDER — SERTRALINE HCL 50 MG PO TABS: 50.0000 mg | ORAL_TABLET | Freq: Every day | ORAL | Status: DC

## 2014-12-25 NOTE — Assessment & Plan Note (Deleted)
Prescribed Zoloft 50 mg daily Will assess response in three weeks.

## 2014-12-25 NOTE — Assessment & Plan Note (Signed)
Anxiety:  Prescribed Zoloft 50 mg daily Will assess response in three weeks.

## 2014-12-25 NOTE — Patient Instructions (Addendum)
It was great seeing you today! We have addressed the following issues today   Anxiety: sent a prescription for sertraline to your pharmacy  PMDD: sent a prescrition to your pharmacy. You can also take Tylenol 500 mg up to four times a day or try Advil 600 mg three time a day as needed for pain  Plan for Pregnancy: take folic acid 027 mcg daily. You can find this over the counter.   If we did any lab work today, and the results require attention, either me or my nurse will get in touch with you. Otherwise, I look forward to talking with you again at our next visit. If you have any questions or concerns before then, please call the clinic at [phone].  Please bring all your medications to every doctors visit   Sign up for My Chart to have easy access to your labs results, and communication with your Primary care physician.    Please check-out at the front desk before leaving the clinic.   Take Care,   Dr. Wendee Beavers  Premenstrual Syndrome Premenstrual syndrome (PMS) is a condition that consists of physical, emotional, and behavioral symptoms that affect women of childbearing age. PMS occurs 5-14 days before the start of a menstrual period and often recurs in a predictable pattern. The symptoms go away a few days after the menstrual period starts. PMS can interfere in many ways with normal daily activities and can range from mild to severe. When PMS is considered severe, it may be diagnosed as premenstrual dysphoric disorder (PMDD). A small percentage of women are affected by PMS symptoms and an even smaller percentage of those women are affected by PMDD.  CAUSES  The exact cause of PMS is unknown, but it seems to be related to cyclic hormone changes that happen before menstruation. These hormones are thought to affect chemicals in the brain (serotonin) that can influence a person's mood.  SYMPTOMS  Symptoms of PMS recur consistently from month to month and go away completely after  the menstrual period starts. The most common emotional or behavioral symptom is mood swings. These mood swings can be disabling and interfere with normal activities of daily living. Other common symptoms include depression and angry outbursts. Other symptoms may include:   Irritability.  Anxiety.  Crying spells.   Food cravings or appetite changes.   Changes in sexual desire.   Confusion.   Aggression.   Social withdrawal.   Poor concentration. The most common physical symptoms include a sense of bloating, breast pain, headaches, and extreme fatigue. Other physical symptoms include:   Backaches.   Swelling of the hands and feet.   Weight gain.   Hot flashes.  DIAGNOSIS  To make a diagnosis, your caregiver will ask questions to confirm that you are having a pattern of symptoms. Symptoms must:   Be present 5 days before the start of your period and be present at least 3 months in a row.   End within 4 days after your period starts.   Interfere with some of your normal activities.  Other conditions, such as thyroid disease, depression, and migraine headaches must be ruled out before a diagnosis of PMS is confirmed.  TREATMENT  Your caregiver may suggest ways to maintain a healthy lifestyle, such as exercise. Over-the-counter pain relievers may ease cramps, aches, pains, headaches, and breast tenderness. However, selective serotonin reuptake inhibitors (SSRIs) are medicines that are most beneficial in improving PMS if taken in the second half of the  monthly cycle. They may be taken on a daily basis. The most effective oral contraceptive pill used for symptoms of PMS is one that contains the ingredient drospirenone. Taking 4 days off of the pill instead of the usual 7 days also has shown to increase effectiveness.  There are a number of drugs, dietary supplements, vitamins, and water pills (diuretics) which have been suggested to be helpful but have not shown to be of  any benefit to improving PMS symptoms.  HOME CARE INSTRUCTIONS   For 2-3 months, write down your symptoms, their severity, and how long they last. This may help your caregiver prescribe the best treatment for your symptoms.  Exercise regularly as suggested by your caregiver.  Eat a regular, well-balanced diet.  Avoid caffeine, alcohol, and tobacco consumption.  Limit salt and salty foods to lessen bloating and fluid retention.  Get enough sleep. Practice relaxation techniques.  Drink enough fluids to keep your urine clear or pale yellow.  Take medicines as directed by your caregiver.  Limit stress.  Take a multivitamin as directed by your caregiver. Document Released: 05/05/2000 Document Revised: 01/31/2012 Document Reviewed: 09/25/2011 [facility] Patient Information 2015 [place], [place]. This information is not intended to replace advice given to you by your health care provider. Make sure you discuss any questions you have with your health care provider.

## 2014-12-25 NOTE — Progress Notes (Signed)
.  amb  Subjective:    Patient ID: Brooke Marks, female    DOB: [birth year], 34 y.o.   MRN: [id number]  HPI Anxiety: reports palpitation, diaphoresis and fear whenever phone rings. Works with [employer]. 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 Now there is a plan to return her back to her previous position which worries her a lot. She reports using her cell phone for call only with her mother. She usually uses text. She says the ring is what bother her and makes her heart beat. She hx of Anxiety for 11 years. Reports numerous family medical leaves on her previous work due to anxiety. Was prescribed a medication in the past but didn't take it out of concern for addiction.  Pain with period: for one and half year. "Feels like something is coming out". Starts one to two weeks prior and resolves with onset of period. Period regular, last 4 to 5 days, flow average. Denies vaginal discharge or dysuria. No pain with intercourse. She is not on any kind of birth control. Like to have a baby. Had one miscarriage at 8 wks 3 years ago.  Preventive health care: normal pap smear in 2010. Would like to have one today.  Review of Systems Per HPI    Objective:   Physical Exam Gen: NAD, well-appearing, calm, appropriate eye contact, normal affect Oropharynx: clear, moist CV: RRR. S1 & S2 audible, no murmurs Resp: no apparent WOB, CTAB. Abd: +BS. Soft, NDNT, no rebound or guarding.  Pelvic exam:   bimanual exam: no cervical motion tenderness, no mass  Speculum: cervix anteroverted, no lesion, no blood. Pap smear obtained. Ext: No edema or gross deformities. Neuro: Alert and oriented, No gross focal deficits    Assessment & Plan:

## 2014-12-25 NOTE — Assessment & Plan Note (Signed)
Bimanual and speculum exam within normal limit. Pap smear done

## 2014-12-25 NOTE — Assessment & Plan Note (Signed)
Prescribed Zoloft 50 mg daily Will assess response in three weeks.

## 2014-12-25 NOTE — Assessment & Plan Note (Signed)
Pain one to two weeks prior to onset of period that resolves with onset of period. Period regular with normal flow. No vaginal discharge. No break through bleeding. Bimanual and speculum exam unremarkable.  -Gave Rx for Zoloft 50 mg daily -will assess the effect in three weeks

## 2014-12-26 ENCOUNTER — Encounter: Payer: Self-pay | Admitting: Student

## 2014-12-29 LAB — CYTOLOGY - PAP

## 2015-01-01 ENCOUNTER — Encounter: Payer: Self-pay | Admitting: Student

## 2015-01-21 ENCOUNTER — Telehealth: Payer: Self-pay | Admitting: Student

## 2015-01-21 ENCOUNTER — Encounter: Payer: Self-pay | Admitting: Student

## 2015-01-21 NOTE — Telephone Encounter (Signed)
Also has a yeast infection  Could she have the pill called in?

## 2015-01-21 NOTE — Telephone Encounter (Signed)
Would like a letter from the dr talking about her anxiety and depression and how it affects her at work. 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

## 2015-01-21 NOTE — Telephone Encounter (Signed)
Contacted pt to see about getting her appointment rescheduled from the cancellation on 9/9 at 3:30 due to clinic closing at 3:00.  She said she didn't need to reschedule that but that she needed the mentioned letter in her my chart message and also to see if she could have something called in for a yeast infection she has due to some recent medication she was prescribed. Told pt I would send message to PCP and wait to see what he wants to do. Katharina Caper, April D, [place]

## 2015-01-22 ENCOUNTER — Encounter: Payer: Self-pay | Admitting: Student

## 2015-01-22 MED ORDER — FLUCONAZOLE 150 MG PO TABS: 150.0000 mg | ORAL_TABLET | Freq: Once | ORAL | Status: DC

## 2015-01-22 NOTE — Telephone Encounter (Signed)
Patient called again regarding yeast infection.  Patient stated she was seen in at an urgent care center about 1 week ago and treated for bacterial infection.  She was given antibiotics and diflucan #2.  Per patient they did not tell her to wait and take one of the diflucan pills until after she completed the course of antibiotic.  Patient took both diflucan pills at same time.  Now that she has completed the antibiotic she has another yeast infection and no medication to take.  She is requesting another round of yeast medication to.  There are no appointments today at [facility].  Clinic is closed on Monday for Labor Holiday.  Please advise.  Derl Barrow, RN

## 2015-01-22 NOTE — Telephone Encounter (Signed)
Spoke with patient regarding allergy to diflucan.  Patient stated she did not have any problems when taking Diflucan.  Will forward to Dr. Ardelia Mems.  Derl Barrow, RN

## 2015-01-22 NOTE — Telephone Encounter (Signed)
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 Will rx diflucan 150mg  x1, with an extra pill to repeat in 3 days if not better. If no improvement on this medication will need to come in for office visit.  Also - defer to PCP about whether to write letter about her anxiety/depression. She may need an office visit for that as well.  Leeanne Rio, MD

## 2015-01-23 ENCOUNTER — Encounter: Payer: Self-pay | Admitting: Student

## 2015-01-28 ENCOUNTER — Encounter: Payer: Self-pay | Admitting: Student

## 2015-01-29 ENCOUNTER — Encounter: Payer: Self-pay | Admitting: *Deleted

## 2015-01-29 ENCOUNTER — Ambulatory Visit: Payer: Commercial Managed Care - HMO | Admitting: Student

## 2015-01-29 NOTE — Telephone Encounter (Signed)
Letter re-mailed. Katharina Caper, April D, [place]

## 2015-03-22 ENCOUNTER — Encounter: Payer: Self-pay | Admitting: Student

## 2015-03-22 NOTE — Telephone Encounter (Signed)
Needs letter to be more specific:  Letter needs to say being on the phone causes anxiety and depression. Please mail letter to pt,.

## 2015-04-09 ENCOUNTER — Ambulatory Visit (INDEPENDENT_AMBULATORY_CARE_PROVIDER_SITE_OTHER): Payer: Commercial Managed Care - HMO | Admitting: Obstetrics and Gynecology

## 2015-04-09 ENCOUNTER — Encounter: Payer: Self-pay | Admitting: Obstetrics and Gynecology

## 2015-04-09 ENCOUNTER — Telehealth: Payer: Self-pay | Admitting: Student

## 2015-04-09 VITALS — BP 138/88 | HR 104 | Temp 98.2°F | Ht 62.0 in | Wt 164.5 lb

## 2015-04-09 DIAGNOSIS — F411 Generalized anxiety disorder: Secondary | ICD-10-CM

## 2015-04-09 DIAGNOSIS — F410 Panic disorder [episodic paroxysmal anxiety] without agoraphobia: Secondary | ICD-10-CM | POA: Diagnosis not present

## 2015-04-09 MED ORDER — ALPRAZOLAM 1 MG PO TABS: 1.0000 mg | ORAL_TABLET | Freq: Two times a day (BID) | ORAL | Status: DC | PRN

## 2015-04-09 MED ORDER — SERTRALINE HCL 100 MG PO TABS: 100.0000 mg | ORAL_TABLET | Freq: Every day | ORAL | Status: DC
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
## 2015-04-09 NOTE — Telephone Encounter (Signed)
Pt calling requesting to be worked in today, says she is having anxiety attacks, offered an appt Monday but she doesn't think she can wait.

## 2015-04-09 NOTE — Patient Instructions (Addendum)
Medications were adjusted.  Work note given Follow-up with [facility]  Social Anxiety Disorder Social anxiety disorder, previously called social phobia, is a mental disorder. People with social anxiety disorder frequently feel nervous, afraid, or embarrassed when around other people in social situations. They constantly worry that other people are judging or criticizing them for how they look, what they say, or how they act. They may worry that other people might reject them because of their appearance or behavior. Social anxiety disorder is more than just occasional shyness or self-consciousness. It can cause severe emotional distress. It can interfere with daily life activities. Social anxiety disorder also may lead to excessive alcohol or drug use and even suicide.  Social anxiety disorder is actually one of the most common mental disorders. It can develop at any time but usually starts in the teenage years. Women are more commonly affected than men. Social anxiety disorder is also more common in people who have family members with anxiety disorders. It also is more common in people who have physical deformities or conditions with characteristics that are obvious to others, such as stuttered speech or movement abnormalities (Parkinson disease).  SYMPTOMS  In addition to feeling anxious or fearful in social situations, people with social anxiety disorder frequently have physical symptoms. Examples include:  Red face (blushing).  Racing heart.  Sweating.  Shaky hands or voice.  Confusion.  Light-headedness.  Upset stomach and diarrhea. DIAGNOSIS  Social anxiety disorder is diagnosed through an assessment by your health care provider. Your health care provider will ask you questions about your mood, thoughts, and reactions in social situations. Your health care provider may ask you about your medical history and use of alcohol or drugs, including prescription medicines. Certain medical conditions  and the use of certain substances, including caffeine, can cause symptoms similar to social anxiety disorder. Your health care provider may refer you to a mental health specialist for further evaluation or treatment. The criteria for diagnosis of social anxiety disorder are:  Marked fear or anxiety in one or more social situations in which you may be closely watched or studied by others. Examples of such situations include:  Interacting socially (having a conversation with others, going to a party, or meeting strangers).  Being observed (eating or drinking in public or being called on in class).  Performing in front of others (giving a speech).  The social situations of concern almost always cause fear or anxiety, not just occasionally.  People with social anxiety disorder fear that they will be viewed negatively in a way that will be embarrassing, will lead to rejection, or will offend others. This fear is out of proportion to the actual threat posed by the social situation.  Often the triggering social situations are avoided, or they are endured with intense fear or anxiety. The fear, anxiety, or avoidance is persistent and lasts for 6 months or longer.  The anxiety causes difficulty functioning in at least some parts of your daily life. TREATMENT  Several types of treatment are available for social anxiety disorder. These treatments are often used in combination and include:   Talk therapy. Group talk therapy allows you to see that you are not alone with these problems. Individual talk therapy helps you address your specific anxiety issues with a caring professional. The most effective forms of talk therapy for social anxiety disorder are cognitive-behavioral therapy and exposure therapy. Cognitive-behavioral therapy helps you to identify and change negative thoughts and beliefs that are at the root of  the disorder. Exposure therapy allows you to gradually face the situations that you fear  most.  Relaxation and coping techniques. These include deep breathing, self-talk, meditation, visual imagery, and yoga. Relaxation techniques help to keep you calm in social situations.  Social Company secretary.Social skills can be learned on your own or with the help of a talk therapist. They can help you feel more confident and comfortable in social situations.  Medicine. For anxiety limited to performance situations (performance anxiety), medicine called beta blockers can help by reducing or preventing the physical symptoms of social anxiety disorder. For more persistent and generalized social anxiety, antidepressant medicine may be prescribed to help control symptoms. In severe cases of social anxiety disorder, strong antianxiety medicine, called benzodiazepines, may be prescribed on a limited basis and for a short time.   This information is not intended to replace advice given to you by your health care provider. Make sure you discuss any questions you have with your health care provider.   Document Released: 04/06/2005 Document Revised: 05/29/2014 Document Reviewed: 08/06/2012 Elsevier Interactive Patient Education [employer].

## 2015-04-09 NOTE — Progress Notes (Signed)
   Subjective:   Patient ID: Brooke Marks, female    DOB: [birth year], 34 y.o.   MRN: [id number]  Patient presents for Same Day Appointment  Chief Complaint  Patient presents with  . 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) -was temporarily placed off of the phones but they are putting her back on the phones -letter at prior visit in August stated that ringing of phone bothered patient so her job put the phone on a low beep stating that she should be able to work with that -she states that when she applied to this job she was told she wouldn't be working with phones -she has taken [employer] several times in the past due to this issue at other jobs -has been avoiding work due to this -she is requesting a more specific work note -issue first start4ed in 2005; has no therapist - last seen by one in 2003 -has been on mediations but avoid anxiety mediations because how it makes her feel   History of anxiety and depression  Symptoms: unable to function, decreased appetite, palpitations No SI/HI  Review of Systems   See HPI for ROS.   Past medical history, surgical, family, and social history reviewed and updated in the EMR as appropriate.  Objective:  BP 138/88 mmHg  Pulse 104  Temp(Src) 98.2 F (36.8 C) (Oral)  Ht 5\' 2"  (1.575 m)  Wt 164 lb 8 oz (74.617 kg)  BMI 30.08 kg/m2  LMP 03/14/2015 Vitals and nursing note reviewed  Physical Exam  Constitutional: She is oriented to person, place, and time. She appears distressed.  Well-appearing and well-nourished  Cardiovascular: Regular rhythm and normal pulses.  Tachycardia present.   Pulmonary/Chest: Effort normal and breath sounds normal.  Neurological: She is alert and oriented to person, place, and time.    Psychiatric: Memory and judgment normal. Her mood appears anxious. She is agitated. She expresses no homicidal and no suicidal ideation.   GAD 7 : Generalized Anxiety Score 04/09/2015  Nervous, Anxious, on Edge 3  Control/stop worrying 3  Worry too much - different things 3  Trouble relaxing 3  Restless 3  Easily annoyed or irritable 3  Afraid - awful might happen 3  Total GAD 7 Score 21  Anxiety Difficulty Extremely difficult    Assessment & Plan:  See Problem List Documentation   Luiz Blare, DO 04/09/2015, 3:43 PM PGY-2, [place]

## 2015-04-09 NOTE — Telephone Encounter (Signed)
Will forward to [facility] pool.  Will also speak with brad regarding this patient.  Jazmin Hartsell,CMA

## 2015-04-13 ENCOUNTER — Telehealth: Payer: Self-pay | Admitting: Student

## 2015-04-13 DIAGNOSIS — F411 Generalized anxiety disorder: Secondary | ICD-10-CM | POA: Insufficient documentation

## 2015-04-13 NOTE — Telephone Encounter (Signed)
Pt has sent numerous my chart messages about her FMLA paperwork.  Please advise

## 2015-04-13 NOTE — Telephone Encounter (Signed)
FMLA paperwork put in Dr. Gerarda Fraction box as she was the last one to see pt. Ottis Stain, CMA

## 2015-04-13 NOTE — Assessment & Plan Note (Signed)
Patient presents today with emotional distress. She has been diagnosed with a severe phobia of answering telephones. This has caused her significant distress over the years she has had to take FMLA due to distress. Panic attacks have worsened recently as employer is requesting patient get back to answering telephones. Behavioral Health consult placed today and patient seen (please see separate note). Patient with elevated heart rate and blood pressure initially in clinic but on recheck improved. -Zoloft increased to 100 mg daily -Prescription for Xanax 1 mg twice a day as needed -She will follow-up with behavioral health clinic in 2 weeks -Work note given strongly encouraging that patient avoid any telephone use -precautions given. Patient also given instructions on a coping plan in case she develops SI -Received psychological education from behavioral health clinic

## 2015-04-13 NOTE — Telephone Encounter (Signed)
Patient dropped off FMLA forms to be completed, patient last saw Dr. Gerarda Fraction on 04/09/15. Placed in white team folder [facility] team)

## 2015-04-14 NOTE — Telephone Encounter (Signed)
I think that it would be best for her PCP to fill out forms. We have both seen her for the same reason. My note and the Behavioral health note have enough information to help make the decision. She never mentioned to me wanting FMLA. If Dr. Cyndia Skeeters has any questions about filling it out I would be happy to assist.

## 2015-04-14 NOTE — Telephone Encounter (Signed)
Patient called back and states she received additional FMLA paperwork from her employer that needs to be completed.  Patient states she has worked for [employer] center since 2014.  Had "breakdown" in 09/2014 and was transferred to different department.  Was not feeling stressed in new department (validates records).  Employer now wants her to return back to her old department as of last week and will not let her return to work due to her without forms being completed expalining her medical condition.  Marland Kitchen  Has not returned to old department due to having anxiety attack last Wednesday, Thursday, and Friday.  Per patient--Employer wants to know how patient's anxiety disorder affects work and her outside life.  Feeling overwhelmed and "stressed" because she is a "temp" employee and does not get pay or benefits while she is out of work.  Instructed patient to forward email to me and I will print out forms to place with first set of FMLA papers, but she will need to complete and sign release of info form (ROI) before we can disclose any information.  Patient verbalized understanding.  Patient tearful initially and feels better now.  Instructed to call [facility] psych ED for guidance/advice if she feels stressed or overwhelmed while we are closed for holiday and weekend.  Patient agreeable to plan and advice. Will place form in Dr. Juliann Pares box for completion and await ROI form from patient on Monday.   Burna Forts, BSN, RN-BC

## 2015-04-14 NOTE — Telephone Encounter (Signed)
I called patient to set up fu appt with [facility], [place]. Patient asked about FMLA paperwork and I informed her that PCP would fill them out. Pt states that her job is requiring them for her situation and she also has three more forms for accomodation to be completed, she plans on bringing those in today. Thank you, Fonda Kinder, ASA

## 2015-04-14 NOTE — Telephone Encounter (Signed)
Will forward to MD. Derrica Sieg,CMA  

## 2015-04-20 NOTE — Telephone Encounter (Signed)
FMLA and RAC forms filled and returned to [place] on 04/20/2015

## 2015-04-22 NOTE — Telephone Encounter (Signed)
Patient informed that forms are complete and ready for pickup.  Forms copied for scanning in patient's record.  Marlon Vonruden L, RN  

## 2015-04-23 ENCOUNTER — Ambulatory Visit (INDEPENDENT_AMBULATORY_CARE_PROVIDER_SITE_OTHER): Payer: Commercial Managed Care - HMO | Admitting: Psychology

## 2015-04-23 DIAGNOSIS — F410 Panic disorder [episodic paroxysmal anxiety] without agoraphobia: Secondary | ICD-10-CM

## 2015-04-23 NOTE — Progress Notes (Signed)
Reason for follow-up: Patient requested follow-up to treat symptoms of panic and anxiety related to talking on the phone at her job.  Issues discussed: Patient reported that she struggles to control her anger and anxiety; discussed CBT model of anger and anxiety and maintaining awareness of thoughts and feelings in situations where patient is angry.  Identified goals: Patient would like to improve her ability to regulate feelings of anger, as well as panic related to her work.

## 2015-04-23 NOTE — Patient Instructions (Signed)
It was great to see you today! Good luck returning to work on Monday - I hope everything goes well.  Until we meet next, please try to write down your Thoughts, Feelings (how your body feels), and Behavior in situations where you feel angry or anxious.
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
## 2015-05-05 ENCOUNTER — Telehealth: Payer: Self-pay | Admitting: Student

## 2015-05-05 NOTE — Telephone Encounter (Signed)
FMLA forms dropped off to be filled out.  Please call when completed.

## 2015-05-10 NOTE — Telephone Encounter (Signed)
Form placed back in PCP box to be completed at the highlighted areas I believe. Katharina Caper, Derhonda Eastlick D, [place]

## 2015-05-10 NOTE — Telephone Encounter (Signed)
Pt calling to check status of this paperwork, states that it needs to be turned in by 26th. Sadie Reynolds, ASA

## 2015-05-11 ENCOUNTER — Encounter: Payer: Self-pay | Admitting: Student

## 2015-05-12 NOTE — Telephone Encounter (Signed)
Left message with patient's mother that FMLA forms are complete and ready for pick up.  Forms copied for scanning in patient's record.  Derl Barrow, RN

## 2015-06-14 ENCOUNTER — Inpatient Hospital Stay (HOSPITAL_COMMUNITY)
Admission: AD | Admit: 2015-06-14 | Discharge: 2015-06-14 | Disposition: A | Discharge status: Home / Self Care | Payer: Commercial Managed Care - HMO | Source: Ambulatory Visit | Attending: Family Medicine | Admitting: Family Medicine

## 2015-06-14 ENCOUNTER — Inpatient Hospital Stay (HOSPITAL_COMMUNITY): Payer: Commercial Managed Care - HMO

## 2015-06-14 ENCOUNTER — Encounter (HOSPITAL_COMMUNITY): Payer: Self-pay | Admitting: *Deleted

## 2015-06-14 DIAGNOSIS — Z3A01 Less than 8 weeks gestation of pregnancy: Secondary | ICD-10-CM | POA: Diagnosis not present

## 2015-06-14 DIAGNOSIS — Z3491 Encounter for supervision of normal pregnancy, unspecified, first trimester: Secondary | ICD-10-CM

## 2015-06-14 DIAGNOSIS — O209 Hemorrhage in early pregnancy, unspecified: Secondary | ICD-10-CM | POA: Insufficient documentation

## 2015-06-14 DIAGNOSIS — O4691 Antepartum hemorrhage, unspecified, first trimester: Secondary | ICD-10-CM | POA: Diagnosis not present

## 2015-06-14 DIAGNOSIS — O26851 Spotting complicating pregnancy, first trimester: Secondary | ICD-10-CM | POA: Diagnosis present

## 2015-06-14 DIAGNOSIS — N9489 Other specified conditions associated with female genital organs and menstrual cycle: Secondary | ICD-10-CM

## 2015-06-14 HISTORY — DX: Chlamydial infection, unspecified: A74.9

## 2015-06-14 LAB — URINE MICROSCOPIC-ADD ON

## 2015-06-14 LAB — HCG, QUANTITATIVE, PREGNANCY: HCG, BETA CHAIN, QUANT, S: 9169 m[IU]/mL — AB (ref <5)

## 2015-06-14 LAB — CBC
HCT: 36.7 % (ref 36.0–46.0)
Hemoglobin: 12.6 g/dL (ref 12.0–15.0)
MCH: 27.8 pg (ref 26.0–34.0)
MCHC: 34.3 g/dL (ref 30.0–36.0)
MCV: 81 fL (ref 78.0–100.0)
PLATELETS: 322 10*3/uL (ref 150–400)
RBC: 4.53 MIL/uL (ref 3.87–5.11)
RDW: 14.1 % (ref 11.5–15.5)
WBC: 8.8 10*3/uL (ref 4.0–10.5)

## 2015-06-14 LAB — URINALYSIS, ROUTINE W REFLEX MICROSCOPIC
BILIRUBIN URINE: NEGATIVE
GLUCOSE, UA: NEGATIVE mg/dL
KETONES UR: 15 mg/dL — AB
Leukocytes, UA: NEGATIVE
Nitrite: NEGATIVE
PROTEIN: NEGATIVE mg/dL
Specific Gravity, Urine: 1.01 (ref 1.005–1.030)
pH: 7 (ref 5.0–8.0)

## 2015-06-14 LAB — WET PREP, GENITAL
Clue Cells Wet Prep HPF POC: NONE SEEN
SPERM: NONE SEEN
Trich, Wet Prep: NONE SEEN
WBC, Wet Prep HPF POC: NONE SEEN
YEAST WET PREP: NONE SEEN

## 2015-06-14 LAB — ABO/RH: ABO/RH(D): B POS

## 2015-06-14 LAB — POCT PREGNANCY, URINE: Preg Test, Ur: POSITIVE — AB

## 2015-06-14 NOTE — MAU Provider Note (Signed)
History     CSN: [id number]  Arrival date and time: 06/14/15 1053   First Provider Initiated Contact with Patient 06/14/15 1206      Chief Complaint  Patient presents with  . Possible Pregnancy  . Vaginal Bleeding   HPI   Brooke Marks is a 35 year old [id number] who presents with vaginal spotting, positive home pregnancy test, and vaginal pain.  Patient states her LMP was Dec 28th and usually has moderate flow.  She has had spotting since last Thursday mostly in the AM, not requiring the use of pads.  She also describes burning on the left side of her vagina. She has not tried anything for the pain.  Nothing makes it better or worse.  She denies shortness of breath, abdominal pain or pressure, urinary urgency, frequency, or abnormal discharge.  OB History    Gravida Para Term Preterm AB TAB SAB Ectopic Multiple Living   3    2 2     0      Past Medical History  Diagnosis Date  . Anxiety   . Depression   . Chlamydia     Past Surgical History  Procedure Laterality Date  . No past surgeries      Family History  Problem Relation Age of Onset  . Diabetes Mother   . Hypertension Father     Social History  Substance Use Topics  . Smoking status: Never Smoker   . Smokeless tobacco: None  . Alcohol Use: None    Allergies: No Known Allergies  Prescriptions prior to admission  Medication Sig Dispense Refill Last Dose  . ibuprofen (ADVIL,MOTRIN) 200 MG tablet Take 800 mg by mouth every 6 (six) hours as needed for headache, mild pain or moderate pain.   06/12/2015  . ALPRAZolam (XANAX) 1 MG tablet Take 1 tablet (1 mg total) by mouth 2 (two) times daily as needed for anxiety. (Patient not taking: Reported on 06/14/2015) 20 tablet 0   . folic acid (V-R FOLIC ACID) A999333 MCG tablet Take 1 tablet (400 mcg total) by mouth daily. (Patient not taking: Reported on 06/14/2015) 30 tablet 2   . sertraline (ZOLOFT) 100 MG tablet Take 1 tablet (100 mg total) by mouth daily. (Patient not  taking: Reported on 06/14/2015) 30 tablet 0    Results for orders placed or performed during the hospital encounter of 06/14/15 (from the past 48 hour(s))  Urinalysis, Routine w reflex microscopic (not at [facility])     Status: Abnormal   Collection Time: 06/14/15 11:21 AM  Result Value Ref Range   Color, Urine YELLOW YELLOW   APPearance HAZY (A) CLEAR   Specific Gravity, Urine 1.010 1.005 - 1.030   pH 7.0 5.0 - 8.0   Glucose, UA NEGATIVE NEGATIVE mg/dL   Hgb urine dipstick SMALL (A) NEGATIVE   Bilirubin Urine NEGATIVE NEGATIVE   Ketones, ur 15 (A) NEGATIVE mg/dL   Protein, ur NEGATIVE NEGATIVE mg/dL   Nitrite NEGATIVE NEGATIVE   Leukocytes, UA NEGATIVE NEGATIVE  Urine microscopic-add on     Status: Abnormal   Collection Time: 06/14/15 11:21 AM  Result Value Ref Range   Squamous Epithelial / LPF 6-30 (A) NONE SEEN   WBC, UA 0-5 0 - 5 WBC/hpf   RBC / HPF 0-5 0 - 5 RBC/hpf   Bacteria, UA RARE (A) NONE SEEN   Urine-Other MUCOUS PRESENT   Pregnancy, urine POC     Status: Abnormal   Collection Time: 06/14/15 11:26 AM  Result Value Ref  Range   Preg Test, Ur POSITIVE (A) NEGATIVE    Comment:        THE SENSITIVITY OF THIS METHODOLOGY IS >24 mIU/mL   CBC     Status: None   Collection Time: 06/14/15 12:29 PM  Result Value Ref Range   WBC 8.8 4.0 - 10.5 K/uL   RBC 4.53 3.87 - 5.11 MIL/uL   Hemoglobin 12.6 12.0 - 15.0 g/dL   HCT 36.7 36.0 - 46.0 %   MCV 81.0 78.0 - 100.0 fL   MCH 27.8 26.0 - 34.0 pg   MCHC 34.3 30.0 - 36.0 g/dL   RDW 14.1 11.5 - 15.5 %   Platelets 322 150 - 400 K/uL  ABO/Rh     Status: None   Collection Time: 06/14/15 12:30 PM  Result Value Ref Range   ABO/RH(D) B POS   hCG, quantitative, pregnancy     Status: Abnormal   Collection Time: 06/14/15 12:30 PM  Result Value Ref Range   hCG, Beta Chain, Quant, S 9169 (H) <5 mIU/mL    Comment:          GEST. AGE      CONC.  (mIU/mL)   <=1 WEEK        5 - 50     2 WEEKS       50 - 500     3 WEEKS       100 - 10,000      4 WEEKS     1,000 - 30,000     5 WEEKS     3,500 - 115,000   6-8 WEEKS     12,000 - 270,000    12 WEEKS     15,000 - 220,000        FEMALE AND NON-PREGNANT FEMALE:     LESS THAN 5 mIU/mL   Wet prep, genital     Status: None   Collection Time: 06/14/15  1:34 PM  Result Value Ref Range   Yeast Wet Prep HPF POC NONE SEEN NONE SEEN   Trich, Wet Prep NONE SEEN NONE SEEN   Clue Cells Wet Prep HPF POC NONE SEEN NONE SEEN   WBC, Wet Prep HPF POC NONE SEEN NONE SEEN   Sperm NONE SEEN    US Ob Comp Less 14 Wks  06/14/2015  CLINICAL DATA:  Vaginal bleeding for 4 days during first trimester pregnancy. EXAM: OBSTETRIC <14 WK [place] AND TRANSVAGINAL OB US TECHNIQUE: Both transabdominal and transvaginal ultrasound examinations were performed for complete evaluation of the gestation as well as the maternal uterus, adnexal regions, and pelvic cul-de-sac. Transvaginal technique was performed to assess early pregnancy. COMPARISON:  None. FINDINGS: Intrauterine gestational sac: Visualized/normal in shape. Yolk sac:  Visualized Embryo:  None visualized MSD: 9  mm   5 w   5  d Subchorionic hemorrhage:  None visualized. Maternal uterus/adnexae: Retroverted uterus. Right ovarian corpus luteum cyst noted. Left ovary is normal in appearance. No adnexal mass or abnormal free fluid visualized. IMPRESSION: Single intrauterine gestational sac with estimated gestational age of [redacted] weeks 5 days by mean sac diameter. Recommend followup of quantitative beta HCG levels, and consider followup ultrasound to assess viability in 10 days. Electronically Signed   By: Earle Gell M.D.   On: 06/14/2015 13:13   US Ob Transvaginal  06/14/2015  CLINICAL DATA:  Vaginal bleeding for 4 days during first trimester pregnancy. EXAM: OBSTETRIC <14 WK [place] AND TRANSVAGINAL OB US TECHNIQUE: Both transabdominal and transvaginal ultrasound examinations  were performed for complete evaluation of the gestation as well as the maternal uterus, adnexal regions, and  pelvic cul-de-sac. Transvaginal technique was performed to assess early pregnancy. COMPARISON:  None. FINDINGS: Intrauterine gestational sac: Visualized/normal in shape. Yolk sac:  Visualized Embryo:  None visualized MSD: 9  mm   5 w   5  d Subchorionic hemorrhage:  None visualized. Maternal uterus/adnexae: Retroverted uterus. Right ovarian corpus luteum cyst noted. Left ovary is normal in appearance. No adnexal mass or abnormal free fluid visualized. IMPRESSION: Single intrauterine gestational sac with estimated gestational age of [redacted] weeks 5 days by mean sac diameter. Recommend followup of quantitative beta HCG levels, and consider followup ultrasound to assess viability in 10 days. Electronically Signed   By: Earle Gell M.D.   On: 06/14/2015 13:13     Review of Systems  Constitutional: Negative for fever and chills.  Gastrointestinal: Negative for nausea, vomiting and abdominal pain.   Physical Exam   Blood pressure 140/86, pulse 103, temperature 98.6 F (37 C), temperature source Oral, resp. rate 18, height 5' 4.5" (1.638 m), weight 177 lb 3.2 oz (80.377 kg), last menstrual period 05/11/2015.  Physical Exam  Nursing note and vitals reviewed. Constitutional: She is oriented to person, place, and time. She appears well-developed and well-nourished. No distress.  Cardiovascular: Normal rate and regular rhythm.   Respiratory: Effort normal. No respiratory distress.  GI: Soft. She exhibits no distension. There is no tenderness.  Genitourinary:  Speculum exam: Vagina - Small amount of thick white discharge, no odor. Normal labia  Cervix - No contact bleeding Bimanual exam: Cervix closed Uterus non tender, normal size Adnexa non tender, no masses bilaterally GC/Chlam, wet prep done Chaperone present for exam.   Neurological: She is alert and oriented to person, place, and time.  Skin: Skin is warm and dry.  Psychiatric: She has a normal mood and affect. Her behavior is normal.    MAU  Course  Procedures  None  MDM Patient presents with vaginal pain, bleeding, and positive home pregnancy test.  Obtain CBC, UA, blood type, wet prep, GC. Ordered US which shows presence of gestational sac. B positive blood type   Assessment and Plan   A:  1. Labial pain   2. Vaginal bleeding in pregnancy, first trimester   3. Normal IUP (intrauterine pregnancy) on prenatal ultrasound, first trimester     P:  Discharge home in stable condition Start prenatal care  Prenatal vitamins daily Return to MAU if symptoms worsen  Bleeding precautions    Lezlie Lye, NP 06/14/2015 2:49 PM

## 2015-06-14 NOTE — Discharge Instructions (Signed)
First Trimester of Pregnancy The first trimester of pregnancy is from week 1 until the end of week 12 (months 1 through 3). A week after a sperm fertilizes an egg, the egg will implant on the wall of the uterus. This embryo will begin to develop into a baby. Genes from you and your partner are forming the baby. The female genes determine whether the baby is a boy or a girl. At 6-8 weeks, the eyes and face are formed, and the heartbeat can be seen on ultrasound. At the end of 12 weeks, all the baby's organs are formed.  Now that you are pregnant, you will want to do everything you can to have a healthy baby. Two of the most important things are to get good prenatal care and to follow your health care provider's instructions. Prenatal care is all the medical care you receive before the baby's birth. This care will help prevent, find, and treat any problems during the pregnancy and childbirth. BODY CHANGES Your body goes through many changes during pregnancy. The changes vary from woman to woman.   You may gain or lose a couple of pounds at first.  You may feel sick to your stomach (nauseous) and throw up (vomit). If the vomiting is uncontrollable, call your health care provider.  You may tire easily.  You may develop headaches that can be relieved by medicines approved by your health care provider.  You may urinate more often. Painful urination may mean you have a bladder infection.  You may develop heartburn as a result of your pregnancy.  You may develop constipation because certain hormones are causing the muscles that push waste through your intestines to slow down.  You may develop hemorrhoids or swollen, bulging veins (varicose veins).  Your breasts may begin to grow larger and become tender. Your nipples may stick out more, and the tissue that surrounds them (areola) may become darker.  Your gums may bleed and may be sensitive to brushing and flossing.  Dark spots or blotches (chloasma,  mask of pregnancy) may develop on your face. This will likely fade after the baby is born.  Your menstrual periods will stop.  You may have a loss of appetite.  You may develop cravings for certain kinds of food.  You may have changes in your emotions from day to day, such as being excited to be pregnant or being concerned that something may go wrong with the pregnancy and baby.  You may have more vivid and strange dreams.  You may have changes in your hair. These can include thickening of your hair, rapid growth, and changes in texture. Some women also have hair loss during or after pregnancy, or hair that feels dry or thin. Your hair will most likely return to normal after your baby is born. WHAT TO EXPECT AT YOUR PRENATAL VISITS During a routine prenatal visit:  You will be weighed to make sure you and the baby are growing normally.  Your blood pressure will be taken.  Your abdomen will be measured to track your baby's growth.  The fetal heartbeat will be listened to starting around week 10 or 12 of your pregnancy.  Test results from any previous visits will be discussed. Your health care provider may ask you:  How you are feeling.  If you are feeling the baby move.  If you have had any abnormal symptoms, such as leaking fluid, bleeding, severe headaches, or abdominal cramping.  If you are using any tobacco products,   including cigarettes, chewing tobacco, and electronic cigarettes.  If you have any questions. Other tests that may be performed during your first trimester include:  Blood tests to find your blood type and to check for the presence of any previous infections. They will also be used to check for low iron levels (anemia) and Rh antibodies. Later in the pregnancy, blood tests for diabetes will be done along with other tests if problems develop.  Urine tests to check for infections, diabetes, or protein in the urine.  An ultrasound to confirm the proper growth  and development of the baby.  An amniocentesis to check for possible genetic problems.  Fetal screens for spina bifida and Down syndrome.  You may need other tests to make sure you and the baby are doing well.  HIV (human immunodeficiency virus) testing. Routine prenatal testing includes screening for HIV, unless you choose not to have this test. HOME CARE INSTRUCTIONS  Medicines  Follow your health care provider's instructions regarding medicine use. Specific medicines may be either safe or unsafe to take during pregnancy.  Take your prenatal vitamins as directed.  If you develop constipation, try taking a stool softener if your health care provider approves. Diet  Eat regular, well-balanced meals. Choose a variety of foods, such as meat or vegetable-based protein, fish, milk and low-fat dairy products, vegetables, fruits, and whole grain breads and cereals. Your health care provider will help you determine the amount of weight gain that is right for you.  Avoid raw meat and uncooked cheese. These carry germs that can cause birth defects in the baby.  Eating four or five small meals rather than three large meals a day may help relieve nausea and vomiting. If you start to feel nauseous, eating a few soda crackers can be helpful. Drinking liquids between meals instead of during meals also seems to help nausea and vomiting.  If you develop constipation, eat more high-fiber foods, such as fresh vegetables or fruit and whole grains. Drink enough fluids to keep your urine clear or pale yellow. Activity and Exercise  Exercise only as directed by your health care provider. Exercising will help you:  Control your weight.  Stay in shape.  Be prepared for labor and delivery.  Experiencing pain or cramping in the lower abdomen or low back is a good sign that you should stop exercising. Check with your health care provider before continuing normal exercises.  Try to avoid standing for long  periods of time. Move your legs often if you must stand in one place for a long time.  Avoid heavy lifting.  Wear low-heeled shoes, and practice good posture.  You may continue to have sex unless your health care provider directs you otherwise. Relief of Pain or Discomfort  Wear a good support bra for breast tenderness.   Take warm sitz baths to soothe any pain or discomfort caused by hemorrhoids. Use hemorrhoid cream if your health care provider approves.   Rest with your legs elevated if you have leg cramps or low back pain.  If you develop varicose veins in your legs, wear support hose. Elevate your feet for 15 minutes, 3-4 times a day. Limit salt in your diet. Prenatal Care  Schedule your prenatal visits by the twelfth week of pregnancy. They are usually scheduled monthly at first, then more often in the last 2 months before delivery.  Write down your questions. Take them to your prenatal visits.  Keep all your prenatal visits as directed by your   health care provider. Safety  Wear your seat belt at all times when driving.  Make a list of emergency phone numbers, including numbers for family, friends, the hospital, and police and fire departments. General Tips  Ask your health care provider for a referral to a local prenatal education class. Begin classes no later than at the beginning of month 6 of your pregnancy.  Ask for help if you have counseling or nutritional needs during pregnancy. Your health care provider can offer advice or refer you to specialists for help with various needs.  Do not use hot tubs, steam rooms, or saunas.  Do not douche or use tampons or scented sanitary pads.  Do not cross your legs for long periods of time.  Avoid cat litter boxes and soil used by cats. These carry germs that can cause birth defects in the baby and possibly loss of the fetus by miscarriage or stillbirth.  Avoid all smoking, herbs, alcohol, and medicines not prescribed by  your health care provider. Chemicals in these affect the formation and growth of the baby.  Do not use any tobacco products, including cigarettes, chewing tobacco, and electronic cigarettes. If you need help quitting, ask your health care provider. You may receive counseling support and other resources to help you quit.  Schedule a dentist appointment. At home, brush your teeth with a soft toothbrush and be gentle when you floss. SEEK MEDICAL CARE IF:   You have dizziness.  You have mild pelvic cramps, pelvic pressure, or nagging pain in the abdominal area.  You have persistent nausea, vomiting, or diarrhea.  You have a bad smelling vaginal discharge.  You have pain with urination.  You notice increased swelling in your face, hands, legs, or ankles. SEEK IMMEDIATE MEDICAL CARE IF:   You have a fever.  You are leaking fluid from your vagina.  You have spotting or bleeding from your vagina.  You have severe abdominal cramping or pain.  You have rapid weight gain or loss.  You vomit blood or material that looks like coffee grounds.  You are exposed to German measles and have never had them.  You are exposed to fifth disease or chickenpox.  You develop a severe headache.  You have shortness of breath.  You have any kind of trauma, such as from a fall or a car accident.   This information is not intended to replace advice given to you by your health care provider. Make sure you discuss any questions you have with your health care provider.   Document Released: 05/02/2001 Document Revised: 05/29/2014 Document Reviewed: 03/18/2013 Elsevier Interactive Patient Education 2016 [employer]  

## 2015-06-14 NOTE — MAU Note (Addendum)
period was to come on last Sun(15th).  Was feeling pressure like she usually does when period is going to start. Saw some spotting on the 19th, off and on in the morning since.  Still feeling pressure, sharp pain on left 'lip'.  +HPT this morning

## 2015-06-15 LAB — HIV ANTIBODY (ROUTINE TESTING W REFLEX): HIV SCREEN 4TH GENERATION: NONREACTIVE

## 2015-06-15 LAB — GC/CHLAMYDIA PROBE AMP (~~LOC~~) NOT AT ARMC
CHLAMYDIA, DNA PROBE: NEGATIVE
Neisseria Gonorrhea: NEGATIVE

## 2015-06-16 ENCOUNTER — Telehealth: Payer: Self-pay | Admitting: *Deleted

## 2015-06-16 ENCOUNTER — Other Ambulatory Visit: Payer: Self-pay | Admitting: Internal Medicine

## 2015-06-16 DIAGNOSIS — Z3490 Encounter for supervision of normal pregnancy, unspecified, unspecified trimester: Secondary | ICD-10-CM

## 2015-06-16 NOTE — Telephone Encounter (Signed)
Patient called and states that she had a confirmed urine and blood pregnancy at [facility] hospital over the weekend.  She states that they did an ultrasound and was advised to get a repeat in 10 days.  [facility] they told her that it would have to be ordered by her pcp's office.  Patient has been scheduled for new ob labs on 06/18/15 and her new ob appt on 06/25/15 with Dr. Avon Gully.  Will forward this message to both pcp and [place] so we can get a follow up ultrasound ordered before appt. Jazmin Hartsell,CMA

## 2015-06-18 ENCOUNTER — Telehealth: Payer: Self-pay | Admitting: Student

## 2015-06-18 ENCOUNTER — Other Ambulatory Visit: Payer: Commercial Managed Care - HMO

## 2015-06-18 NOTE — Telephone Encounter (Signed)
Order changed from [place] to [facility] res so it could appear in our workqueue.  Patient will be contacted regarding an appt.Jazmin Hartsell,CMA

## 2015-06-18 NOTE — Telephone Encounter (Signed)
Pt is here. Pt states at last [place] in notes she was told to have another US done within 10 days (by the 2nd).  Pt would like to have this ordered.  If possible pt would prefer weekend so she miss as little work as possible. 06/26/15 would be ideal.  Fonda Kinder, ASA

## 2015-06-19 ENCOUNTER — Other Ambulatory Visit: Payer: Self-pay | Admitting: Student

## 2015-06-23 ENCOUNTER — Telehealth: Payer: Self-pay | Admitting: Student

## 2015-06-23 NOTE — Telephone Encounter (Signed)
Pt is returning a call to [place]. She can not go to have the US done on 06/28/15 she needs this for 06/25/15 since she is already off that day. Her job does not let her off a lot. Please call patient at [phone] her work number [employer]

## 2015-06-23 NOTE — Telephone Encounter (Signed)
LM with pt's mother to have patient return call. Please advise patient that [facility] not do [facility] on Saturdays (unless she is being seen thru [facility]). Her U/S appt is scheduled for Mon 2/6 at 10 AM at [facility]. If this does not work for patient she can call scheduling at [phone] to reschedule.

## 2015-06-25 ENCOUNTER — Encounter: Payer: Commercial Managed Care - HMO | Admitting: Internal Medicine

## 2015-06-28 ENCOUNTER — Ambulatory Visit (HOSPITAL_COMMUNITY): Payer: Commercial Managed Care - HMO

## 2015-08-20 ENCOUNTER — Encounter: Payer: Self-pay | Admitting: Student

## 2016-01-27 ENCOUNTER — Encounter (HOSPITAL_COMMUNITY): Payer: Self-pay

## 2016-01-27 ENCOUNTER — Inpatient Hospital Stay (HOSPITAL_COMMUNITY): Payer: Commercial Managed Care - HMO | Admitting: Anesthesiology

## 2016-01-27 ENCOUNTER — Encounter (HOSPITAL_COMMUNITY)
Admission: AD | Disposition: A | Discharge status: Home / Self Care | Payer: Self-pay | Source: Ambulatory Visit | Attending: Obstetrics and Gynecology

## 2016-01-27 ENCOUNTER — Inpatient Hospital Stay (HOSPITAL_COMMUNITY)
Admission: AD | Admit: 2016-01-27 | Discharge: 2016-01-30 | DRG: 765 | Disposition: A | Discharge status: Home / Self Care | Payer: Commercial Managed Care - HMO | Source: Ambulatory Visit | Attending: Obstetrics and Gynecology | Admitting: Obstetrics and Gynecology

## 2016-01-27 DIAGNOSIS — O9962 Diseases of the digestive system complicating childbirth: Secondary | ICD-10-CM | POA: Diagnosis present

## 2016-01-27 DIAGNOSIS — Z98891 History of uterine scar from previous surgery: Secondary | ICD-10-CM

## 2016-01-27 DIAGNOSIS — O1404 Mild to moderate pre-eclampsia, complicating childbirth: Secondary | ICD-10-CM | POA: Diagnosis present

## 2016-01-27 DIAGNOSIS — O139 Gestational [pregnancy-induced] hypertension without significant proteinuria, unspecified trimester: Secondary | ICD-10-CM

## 2016-01-27 DIAGNOSIS — O24425 Gestational diabetes mellitus in childbirth, controlled by oral hypoglycemic drugs: Secondary | ICD-10-CM | POA: Diagnosis present

## 2016-01-27 DIAGNOSIS — O99214 Obesity complicating childbirth: Secondary | ICD-10-CM | POA: Diagnosis present

## 2016-01-27 DIAGNOSIS — Z6841 Body Mass Index (BMI) 40.0 and over, adult: Secondary | ICD-10-CM

## 2016-01-27 DIAGNOSIS — R030 Elevated blood-pressure reading, without diagnosis of hypertension: Secondary | ICD-10-CM | POA: Diagnosis present

## 2016-01-27 DIAGNOSIS — O1493 Unspecified pre-eclampsia, third trimester: Secondary | ICD-10-CM | POA: Diagnosis not present

## 2016-01-27 DIAGNOSIS — Z3A38 38 weeks gestation of pregnancy: Secondary | ICD-10-CM

## 2016-01-27 DIAGNOSIS — K219 Gastro-esophageal reflux disease without esophagitis: Secondary | ICD-10-CM | POA: Diagnosis present

## 2016-01-27 DIAGNOSIS — I10 Essential (primary) hypertension: Secondary | ICD-10-CM | POA: Diagnosis present

## 2016-01-27 HISTORY — DX: Gestational (pregnancy-induced) hypertension without significant proteinuria, unspecified trimester: O13.9

## 2016-01-27 HISTORY — DX: History of uterine scar from previous surgery: Z98.891

## 2016-01-27 HISTORY — DX: Type 2 diabetes mellitus without complications: E11.9

## 2016-01-27 LAB — LACTATE DEHYDROGENASE: LDH: 148 U/L (ref 98–192)

## 2016-01-27 LAB — URINALYSIS, ROUTINE W REFLEX MICROSCOPIC
Bilirubin Urine: NEGATIVE
GLUCOSE, UA: NEGATIVE mg/dL
KETONES UR: NEGATIVE mg/dL
Nitrite: NEGATIVE
PROTEIN: NEGATIVE mg/dL
Specific Gravity, Urine: 1.015 (ref 1.005–1.030)
pH: 7 (ref 5.0–8.0)

## 2016-01-27 LAB — GLUCOSE, CAPILLARY: Glucose-Capillary: 130 mg/dL — ABNORMAL HIGH (ref 65–99)

## 2016-01-27 LAB — COMPREHENSIVE METABOLIC PANEL
ALBUMIN: 2.9 g/dL — AB (ref 3.5–5.0)
ALT: 11 U/L — ABNORMAL LOW (ref 14–54)
ANION GAP: 8 (ref 5–15)
AST: 16 U/L (ref 15–41)
Alkaline Phosphatase: 55 U/L (ref 38–126)
BUN: 7 mg/dL (ref 6–20)
CO2: 21 mmol/L — AB (ref 22–32)
Calcium: 8.4 mg/dL — ABNORMAL LOW (ref 8.9–10.3)
Chloride: 107 mmol/L (ref 101–111)
Creatinine, Ser: 0.52 mg/dL (ref 0.44–1.00)
GFR calc Af Amer: >60 mL/min (ref >60)
GFR calc non Af Amer: >60 mL/min (ref >60)
Glucose, Bld: 96 mg/dL (ref 65–99)
POTASSIUM: 3.4 mmol/L — AB (ref 3.5–5.1)
SODIUM: 136 mmol/L (ref 135–145)
Total Bilirubin: 0.2 mg/dL — ABNORMAL LOW (ref 0.3–1.2)
Total Protein: 6.2 g/dL — ABNORMAL LOW (ref 6.5–8.1)

## 2016-01-27 LAB — CBC
HCT: 36 % (ref 36.0–46.0)
HEMOGLOBIN: 12.2 g/dL (ref 12.0–15.0)
MCH: 25.6 pg — ABNORMAL LOW (ref 26.0–34.0)
MCHC: 33.9 g/dL (ref 30.0–36.0)
MCV: 75.6 fL — ABNORMAL LOW (ref 78.0–100.0)
Platelets: 216 10*3/uL (ref 150–400)
RBC: 4.76 MIL/uL (ref 3.87–5.11)
RDW: 17.1 % — AB (ref 11.5–15.5)
WBC: 8.8 10*3/uL (ref 4.0–10.5)

## 2016-01-27 LAB — URINE MICROSCOPIC-ADD ON

## 2016-01-27 LAB — PROTEIN / CREATININE RATIO, URINE
Creatinine, Urine: 121 mg/dL
PROTEIN CREATININE RATIO: 0.32 mg/mg{creat} — AB (ref 0.00–0.15)
TOTAL PROTEIN, URINE: 39 mg/dL

## 2016-01-27 LAB — URIC ACID: Uric Acid, Serum: 4.1 mg/dL (ref 2.3–6.6)

## 2016-01-27 LAB — TYPE AND SCREEN
ABO/RH(D): B POS
Antibody Screen: NEGATIVE

## 2016-01-27 SURGERY — Surgical Case
Anesthesia: Spinal | Site: Abdomen

## 2016-01-27 MED ORDER — OXYCODONE HCL 5 MG PO TABS
10.0000 mg | ORAL_TABLET | ORAL | Status: DC | PRN
  Administered 2016-01-28: 10 mg via ORAL
  Filled 2016-01-27: qty 2

## 2016-01-27 MED ORDER — MENTHOL 3 MG MT LOZG: 1.0000 | LOZENGE | OROMUCOSAL | Status: DC | PRN

## 2016-01-27 MED ORDER — PHENYLEPHRINE 8 MG IN D5W 100 ML (0.08MG/ML) PREMIX OPTIME
INJECTION | INTRAVENOUS | Status: AC
  Filled 2016-01-27: qty 100

## 2016-01-27 MED ORDER — MORPHINE SULFATE-NACL 0.5-0.9 MG/ML-% IV SOSY
PREFILLED_SYRINGE | INTRAVENOUS | Status: AC
  Filled 2016-01-27: qty 1

## 2016-01-27 MED ORDER — PHENYLEPHRINE HCL 10 MG/ML IJ SOLN
INTRAMUSCULAR | Status: DC | PRN
  Administered 2016-01-27: 80 ug via INTRAVENOUS

## 2016-01-27 MED ORDER — FENTANYL CITRATE (PF) 100 MCG/2ML IJ SOLN
INTRAMUSCULAR | Status: DC | PRN
  Administered 2016-01-27: 90 ug via INTRAVENOUS
  Administered 2016-01-27: 10 ug via INTRAVENOUS

## 2016-01-27 MED ORDER — IBUPROFEN 800 MG PO TABS
800.0000 mg | ORAL_TABLET | Freq: Three times a day (TID) | ORAL | Status: DC
  Administered 2016-01-28 – 2016-01-30 (×5): 800 mg via ORAL
  Filled 2016-01-27 (×6): qty 1

## 2016-01-27 MED ORDER — SCOPOLAMINE 1 MG/3DAYS TD PT72
1.0000 | MEDICATED_PATCH | Freq: Once | TRANSDERMAL | Status: DC
  Filled 2016-01-27: qty 1

## 2016-01-27 MED ORDER — NALOXONE HCL 2 MG/2ML IJ SOSY
1.0000 ug/kg/h | PREFILLED_SYRINGE | INTRAVENOUS | Status: DC | PRN
  Filled 2016-01-27: qty 2

## 2016-01-27 MED ORDER — MEPERIDINE HCL 25 MG/ML IJ SOLN
6.2500 mg | INTRAMUSCULAR | Status: DC | PRN
Start: 2016-01-27 — End: 2016-01-28

## 2016-01-27 MED ORDER — DIPHENHYDRAMINE HCL 50 MG/ML IJ SOLN: 12.5000 mg | INTRAMUSCULAR | Status: DC | PRN

## 2016-01-27 MED ORDER — SCOPOLAMINE 1 MG/3DAYS TD PT72
MEDICATED_PATCH | TRANSDERMAL | Status: DC | PRN
  Administered 2016-01-27: 1 via TRANSDERMAL

## 2016-01-27 MED ORDER — ACETAMINOPHEN 325 MG PO TABS
650.0000 mg | ORAL_TABLET | ORAL | Status: DC | PRN
  Administered 2016-01-29: 650 mg via ORAL
  Filled 2016-01-27: qty 2

## 2016-01-27 MED ORDER — SIMETHICONE 80 MG PO CHEW
80.0000 mg | CHEWABLE_TABLET | ORAL | Status: DC
  Administered 2016-01-28 – 2016-01-29 (×2): 80 mg via ORAL
  Filled 2016-01-27 (×2): qty 1

## 2016-01-27 MED ORDER — PRENATAL MULTIVITAMIN CH: 1.0000 | ORAL_TABLET | Freq: Every day | ORAL | Status: DC

## 2016-01-27 MED ORDER — WITCH HAZEL-GLYCERIN EX PADS: 1.0000 "application " | MEDICATED_PAD | CUTANEOUS | Status: DC | PRN

## 2016-01-27 MED ORDER — NALBUPHINE HCL 10 MG/ML IJ SOLN
5.0000 mg | INTRAMUSCULAR | Status: DC | PRN
  Administered 2016-01-28: 5 mg via INTRAVENOUS
  Filled 2016-01-27 (×2): qty 1

## 2016-01-27 MED ORDER — NALOXONE HCL 0.4 MG/ML IJ SOLN: 0.4000 mg | INTRAMUSCULAR | Status: DC | PRN

## 2016-01-27 MED ORDER — FENTANYL CITRATE (PF) 100 MCG/2ML IJ SOLN
INTRAMUSCULAR | Status: AC
  Filled 2016-01-27: qty 2

## 2016-01-27 MED ORDER — PHENYLEPHRINE 8 MG IN D5W 100 ML (0.08MG/ML) PREMIX OPTIME
INJECTION | INTRAVENOUS | Status: DC | PRN
  Administered 2016-01-27: 30 ug/min via INTRAVENOUS

## 2016-01-27 MED ORDER — ZOLPIDEM TARTRATE 5 MG PO TABS: 5.0000 mg | ORAL_TABLET | Freq: Every evening | ORAL | Status: DC | PRN

## 2016-01-27 MED ORDER — LACTATED RINGERS IV BOLUS (SEPSIS): 1000.0000 mL | Freq: Once | INTRAVENOUS | Status: DC

## 2016-01-27 MED ORDER — OXYTOCIN 10 UNIT/ML IJ SOLN
INTRAVENOUS | Status: DC | PRN
  Administered 2016-01-27: 40 [IU] via INTRAVENOUS

## 2016-01-27 MED ORDER — DIPHENHYDRAMINE HCL 25 MG PO CAPS
25.0000 mg | ORAL_CAPSULE | ORAL | Status: DC | PRN
  Administered 2016-01-28 (×2): 25 mg via ORAL
  Filled 2016-01-27 (×2): qty 1

## 2016-01-27 MED ORDER — HYDRALAZINE HCL 20 MG/ML IJ SOLN: 10.0000 mg | Freq: Once | INTRAMUSCULAR | Status: DC | PRN

## 2016-01-27 MED ORDER — SENNOSIDES-DOCUSATE SODIUM 8.6-50 MG PO TABS
2.0000 | ORAL_TABLET | ORAL | Status: DC
  Administered 2016-01-28 – 2016-01-29 (×2): 2 via ORAL
  Filled 2016-01-27 (×2): qty 2

## 2016-01-27 MED ORDER — ONDANSETRON HCL 4 MG/2ML IJ SOLN
INTRAMUSCULAR | Status: DC | PRN
  Administered 2016-01-27: 4 mg via INTRAVENOUS

## 2016-01-27 MED ORDER — COCONUT OIL OIL: 1.0000 "application " | TOPICAL_OIL | Status: DC | PRN

## 2016-01-27 MED ORDER — ACETAMINOPHEN 10 MG/ML IV SOLN
1000.0000 mg | Freq: Four times a day (QID) | INTRAVENOUS | Status: AC
  Administered 2016-01-27: 1000 mg via INTRAVENOUS
  Filled 2016-01-27 (×4): qty 100

## 2016-01-27 MED ORDER — PHENYLEPHRINE 40 MCG/ML (10ML) SYRINGE FOR IV PUSH (FOR BLOOD PRESSURE SUPPORT)
PREFILLED_SYRINGE | INTRAVENOUS | Status: AC
  Filled 2016-01-27: qty 10

## 2016-01-27 MED ORDER — ONDANSETRON HCL 4 MG/2ML IJ SOLN
4.0000 mg | Freq: Once | INTRAMUSCULAR | Status: DC | PRN
Start: 2016-01-27 — End: 2016-01-28

## 2016-01-27 MED ORDER — LABETALOL HCL 5 MG/ML IV SOLN: 20.0000 mg | INTRAVENOUS | Status: DC | PRN

## 2016-01-27 MED ORDER — SIMETHICONE 80 MG PO CHEW
80.0000 mg | CHEWABLE_TABLET | Freq: Three times a day (TID) | ORAL | Status: DC
  Administered 2016-01-29 – 2016-01-30 (×3): 80 mg via ORAL
  Filled 2016-01-27 (×4): qty 1

## 2016-01-27 MED ORDER — SCOPOLAMINE 1 MG/3DAYS TD PT72
MEDICATED_PATCH | TRANSDERMAL | Status: AC
  Filled 2016-01-27: qty 3

## 2016-01-27 MED ORDER — BUPIVACAINE IN DEXTROSE 0.75-8.25 % IT SOLN
INTRATHECAL | Status: DC | PRN
  Administered 2016-01-27: 1.6 mL via INTRATHECAL

## 2016-01-27 MED ORDER — DIPHENHYDRAMINE HCL 25 MG PO CAPS: 25.0000 mg | ORAL_CAPSULE | Freq: Four times a day (QID) | ORAL | Status: DC | PRN

## 2016-01-27 MED ORDER — NALBUPHINE HCL 10 MG/ML IJ SOLN: 5.0000 mg | Freq: Once | INTRAMUSCULAR | Status: DC | PRN

## 2016-01-27 MED ORDER — OXYTOCIN 40 UNITS IN LACTATED RINGERS INFUSION - SIMPLE MED: 2.5000 [IU]/h | INTRAVENOUS | Status: AC

## 2016-01-27 MED ORDER — SODIUM CHLORIDE 0.9% FLUSH
3.0000 mL | INTRAVENOUS | Status: DC | PRN
  Administered 2016-01-28: 3 mL via INTRAVENOUS
  Filled 2016-01-27: qty 3

## 2016-01-27 MED ORDER — SOD CITRATE-CITRIC ACID 500-334 MG/5ML PO SOLN
30.0000 mL | Freq: Once | ORAL | Status: AC
Start: 2016-01-27 — End: 2016-01-27
  Administered 2016-01-27: 30 mL via ORAL
  Filled 2016-01-27: qty 15

## 2016-01-27 MED ORDER — NALBUPHINE HCL 10 MG/ML IJ SOLN: 5.0000 mg | INTRAMUSCULAR | Status: DC | PRN

## 2016-01-27 MED ORDER — FAMOTIDINE IN NACL 20-0.9 MG/50ML-% IV SOLN
20.0000 mg | Freq: Once | INTRAVENOUS | Status: AC
  Administered 2016-01-27: 20 mg via INTRAVENOUS
  Filled 2016-01-27: qty 50

## 2016-01-27 MED ORDER — LACTATED RINGERS IV SOLN
INTRAVENOUS | Status: DC
  Administered 2016-01-27: 21:00:00 via INTRAVENOUS

## 2016-01-27 MED ORDER — FENTANYL CITRATE (PF) 100 MCG/2ML IJ SOLN: 25.0000 ug | INTRAMUSCULAR | Status: DC | PRN

## 2016-01-27 MED ORDER — KETOROLAC TROMETHAMINE 30 MG/ML IJ SOLN: 30.0000 mg | Freq: Four times a day (QID) | INTRAMUSCULAR | Status: AC | PRN

## 2016-01-27 MED ORDER — DIBUCAINE 1 % RE OINT: 1.0000 "application " | TOPICAL_OINTMENT | RECTAL | Status: DC | PRN

## 2016-01-27 MED ORDER — CEFAZOLIN SODIUM-DEXTROSE 2-4 GM/100ML-% IV SOLN
2.0000 g | INTRAVENOUS | Status: AC
  Administered 2016-01-27: 2 g via INTRAVENOUS
  Filled 2016-01-27: qty 100

## 2016-01-27 MED ORDER — OXYCODONE HCL 5 MG PO TABS: 5.0000 mg | ORAL_TABLET | ORAL | Status: DC | PRN

## 2016-01-27 MED ORDER — PHENYLEPHRINE 40 MCG/ML (10ML) SYRINGE FOR IV PUSH (FOR BLOOD PRESSURE SUPPORT)
PREFILLED_SYRINGE | INTRAVENOUS | Status: AC
  Filled 2016-01-27: qty 20

## 2016-01-27 MED ORDER — SODIUM CHLORIDE 0.9 % IR SOLN
Status: DC | PRN
  Administered 2016-01-27: 1000 mL

## 2016-01-27 MED ORDER — LACTATED RINGERS IV SOLN
INTRAVENOUS | Status: DC
  Administered 2016-01-27: 20:00:00 via INTRAVENOUS

## 2016-01-27 MED ORDER — MORPHINE SULFATE (PF) 0.5 MG/ML IJ SOLN
INTRAMUSCULAR | Status: DC | PRN
  Administered 2016-01-27: .2 mg via EPIDURAL
  Administered 2016-01-27: .3 mg via INTRAVENOUS

## 2016-01-27 MED ORDER — SIMETHICONE 80 MG PO CHEW: 80.0000 mg | CHEWABLE_TABLET | ORAL | Status: DC | PRN

## 2016-01-27 MED ORDER — MORPHINE SULFATE (PF) 0.5 MG/ML IJ SOLN
INTRAMUSCULAR | Status: AC
  Filled 2016-01-27: qty 10

## 2016-01-27 MED ORDER — ONDANSETRON HCL 4 MG/2ML IJ SOLN
INTRAMUSCULAR | Status: AC
  Filled 2016-01-27: qty 2

## 2016-01-27 MED ORDER — ONDANSETRON HCL 4 MG/2ML IJ SOLN: 4.0000 mg | Freq: Three times a day (TID) | INTRAMUSCULAR | Status: DC | PRN

## 2016-01-27 MED ORDER — ACETAMINOPHEN 500 MG PO TABS
1000.0000 mg | ORAL_TABLET | Freq: Four times a day (QID) | ORAL | Status: AC
  Filled 2016-01-27: qty 2

## 2016-01-27 MED ORDER — LACTATED RINGERS IV SOLN
INTRAVENOUS | Status: DC
  Administered 2016-01-28: 08:00:00 via INTRAVENOUS

## 2016-01-27 MED ORDER — OXYTOCIN 10 UNIT/ML IJ SOLN
INTRAMUSCULAR | Status: AC
  Filled 2016-01-27: qty 4

## 2016-01-27 SURGICAL SUPPLY — 37 items
BENZOIN TINCTURE PRP APPL 2/3 (GAUZE/BANDAGES/DRESSINGS) ×2 IMPLANT
CHLORAPREP W/TINT 26ML (MISCELLANEOUS) ×2 IMPLANT
CLAMP CORD UMBIL (MISCELLANEOUS) IMPLANT
CLOTH BEACON ORANGE TIMEOUT ST (SAFETY) ×2 IMPLANT
CONTAINER PREFILL 10% NBF 15ML (MISCELLANEOUS) IMPLANT
DRSG OPSITE POSTOP 4X10 (GAUZE/BANDAGES/DRESSINGS) ×2 IMPLANT
ELECT REM PT RETURN 9FT ADLT (ELECTROSURGICAL) ×2
ELECTRODE REM PT RTRN 9FT ADLT (ELECTROSURGICAL) ×1 IMPLANT
EXTRACTOR VACUUM M CUP 4 TUBE (SUCTIONS) IMPLANT
GLOVE BIO SURGEON STRL SZ 6.5 (GLOVE) ×4 IMPLANT
GLOVE BIOGEL PI IND STRL 6.5 (GLOVE) ×1 IMPLANT
GLOVE BIOGEL PI IND STRL 7.0 (GLOVE) ×3 IMPLANT
GLOVE BIOGEL PI INDICATOR 6.5 (GLOVE) ×1
GLOVE BIOGEL PI INDICATOR 7.0 (GLOVE) ×3
GOWN STRL REUS W/TWL LRG LVL3 (GOWN DISPOSABLE) ×4 IMPLANT
KIT ABG SYR 3ML LUER SLIP (SYRINGE) IMPLANT
NEEDLE HYPO 25X5/8 SAFETYGLIDE (NEEDLE) IMPLANT
NS IRRIG 1000ML POUR BTL (IV SOLUTION) ×2 IMPLANT
PACK C SECTION WH (CUSTOM PROCEDURE TRAY) ×2 IMPLANT
PAD ABD 7.5X8 STRL (GAUZE/BANDAGES/DRESSINGS) ×2 IMPLANT
PAD OB MATERNITY 4.3X12.25 (PERSONAL CARE ITEMS) ×2 IMPLANT
PENCIL SMOKE EVAC W/HOLSTER (ELECTROSURGICAL) ×2 IMPLANT
RTRCTR C-SECT PINK 25CM LRG (MISCELLANEOUS) ×2 IMPLANT
SPONGE GAUZE 4X4 12PLY (GAUZE/BANDAGES/DRESSINGS) ×2 IMPLANT
STRIP CLOSURE SKIN 1/2X4 (GAUZE/BANDAGES/DRESSINGS) ×2 IMPLANT
SUT MNCRL 0 VIOLET CTX 36 (SUTURE) ×2 IMPLANT
SUT MONOCRYL 0 CTX 36 (SUTURE) ×2
SUT PLAIN 1 NONE 54 (SUTURE) IMPLANT
SUT PLAIN 2 0 XLH (SUTURE) ×2 IMPLANT
SUT VIC AB 0 CT1 27 (SUTURE) ×2
SUT VIC AB 0 CT1 27XBRD ANBCTR (SUTURE) ×2 IMPLANT
SUT VIC AB 2-0 CT1 27 (SUTURE) ×1
SUT VIC AB 2-0 CT1 TAPERPNT 27 (SUTURE) ×1 IMPLANT
SUT VIC AB 4-0 KS 27 (SUTURE) ×2 IMPLANT
SYR BULB IRRIGATION 50ML (SYRINGE) ×2 IMPLANT
TOWEL OR 17X24 6PK STRL BLUE (TOWEL DISPOSABLE) ×4 IMPLANT
TRAY FOLEY CATH SILVER 14FR (SET/KITS/TRAYS/PACK) ×2 IMPLANT

## 2016-01-27 NOTE — Anesthesia Procedure Notes (Signed)
Spinal  Patient location during procedure: OR Staffing Anesthesiologist: Lauretta Grill Performed: anesthesiologist  Preanesthetic Checklist Completed: patient identified, site marked, surgical consent, pre-op evaluation, timeout performed, IV checked, risks and benefits discussed and monitors and equipment checked Spinal Block Patient position: sitting Prep: DuraPrep Patient monitoring: continuous pulse ox, blood pressure and heart rate Approach: midline Injection technique: single-shot Needle Needle type: Spinocan  Needle gauge: 24 G Needle length: 9 cm Additional Notes Functioning IV was confirmed and monitors were applied. Sterile prep and drape, including hand hygiene, mask and sterile gloves were used. The patient was positioned and the spine was prepped. The skin was anesthetized with lidocaine.  Free flow of clear CSF was obtained prior to injecting local anesthetic into the CSF.  The spinal needle aspirated freely following injection.  The needle was carefully withdrawn.  The patient tolerated the procedure well. Consent was obtained prior to procedure with all questions answered and concerns addressed. Risks including but not limited to bleeding, infection, nerve damage, paralysis, failed block, inadequate analgesia, allergic reaction, high spinal, itching and headache were discussed and the patient wished to proceed.   Lauretta Grill, MD

## 2016-01-27 NOTE — H&P (Signed)
Brooke Marks is a 35 y.o. female G3P0020 at 84+ with elevated BP in office today 180/105, 139/94 - sent to MAU for eval 162/`07, 139/85.  Pr/Cr ration of 0.32.  D/w pt risks of PreE and advice for delivery.  Pt voices understanding, desires primary LTCS - d/w pt at length r/b/a - wishes to proceed.  Pregnancy also complicated by GDM controlled with 2.5mg  glyburide QHS.  Pt's EDC = 02/08/16.    OB History    Gravida Para Term Preterm AB Living   3       2 0   SAB TAB Ectopic Multiple Live Births     2          G1&2 TAB G3 persent GDM  No abn pap,  + h/o Chl and PID  Past Medical History:  Diagnosis Date  . Anxiety   . Chlamydia   . Depression   . Diabetes mellitus without complication ([place])    Gestational   Past Surgical History:  Procedure Laterality Date  . WISDOM TOOTH EXTRACTION    TAB x 2  Family History: family history includes Diabetes in her mother; Hypertension in her father. Social History:  reports that she has never smoked. She has never used smokeless tobacco. She reports that she does not drink alcohol or use drugs. engaged  Meds glyburide All NKDA     Maternal Diabetes: Yes:  Diabetes Type:  Insulin/Medication controlled Genetic Screening: Normal Maternal Ultrasounds/Referrals: Normal Fetal Ultrasounds or other Referrals:  None Maternal Substance Abuse:  No Significant Maternal Medications:  Meds include: Other: Glyburide 2.5 Significant Maternal Lab Results:  Lab values include: Group B Strep negative Other Comments:  CF neg  Review of Systems  Constitutional: Negative.   HENT: Negative.   Eyes: Negative.   Respiratory: Negative.   Cardiovascular: Negative.   Gastrointestinal: Negative.   Genitourinary: Negative.   Musculoskeletal: Positive for back pain.  Skin: Negative.   Neurological: Negative.   Psychiatric/Behavioral: Negative.    Maternal Medical History:  Contractions: Frequency: irregular.    Fetal activity: Perceived fetal  activity is normal.    Prenatal Complications - Diabetes: none. Diabetes is managed by oral agent (monotherapy).        Blood pressure 131/81, pulse 98, temperature 97.8 F (36.6 C), temperature source Oral, resp. rate 18, height 5\' 2"  (1.575 m), weight 110.2 kg (243 lb), last menstrual period 05/11/2015. Maternal Exam:  Uterine Assessment: Contraction frequency is regular.   Abdomen: Patient reports no abdominal tenderness. Fundal height is appropriate for gestation.   Estimated fetal weight is 7-8#.   Fetal presentation: vertex  Introitus: Normal vulva. Normal vagina.    Physical Exam  Constitutional: She is oriented to person, place, and time. She appears well-developed and well-nourished.  HENT:  Head: Normocephalic and atraumatic.  Cardiovascular: Normal rate and regular rhythm.   Respiratory: Effort normal and breath sounds normal. No respiratory distress. She has no wheezes.  GI: Soft. Bowel sounds are normal. She exhibits no distension. There is no tenderness.  Musculoskeletal: Normal range of motion.  Neurological: She is alert and oriented to person, place, and time.  Skin: Skin is warm and dry.  Psychiatric: She has a normal mood and affect. Her behavior is normal.    Prenatal labs: ABO, Rh: --/--/B POS (01/23 1230) Antibody:  neg Rubella:  immune RPR:   NR HBsAg:   neg HIV: Non Reactive (01/23 1229)  GBS:   neg  hgb 12.4/Plt 339/UrCx neg/Chl neg/ GC neg/ glucola  164 - 3 hr abn - GDM;  Nl AFP, nl first trimester [place]  Nl NT,  Nl anat, post plac, female  Tdap 11/11/15   Assessment/Plan: 35yo G1 at 38+ with PreE for primary LTCS Ancef for prophylaxis Pt desires primary LTCS - d/w her at length r/b/a of LTCS Monitor BP and sx's closely    Bovard-Stuckert, Karlis Cregg 01/27/2016, 7:50 PM

## 2016-01-27 NOTE — Brief Op Note (Signed)
01/27/2016  10:02 PM  PATIENT:  Brooke Marks  35 y.o. female  PRE-OPERATIVE DIAGNOSIS:  Primary Cesarean Section, PIH, GDM  POST-OPERATIVE DIAGNOSIS:  Primary Cesarean Section, PIH, GDM  PROCEDURE:  Procedure(s) with comments: CESAREAN SECTION (N/A) - MD requests RNFA  SURGEON:  Surgeon(s) and Role:    * Janyth Contes, MD - Primary  FINDINGS: viable female infant at 21:05, apgars 8/9, wt P; nl uterus, tubes and ovaries.  ANESTHESIA:   spinal  EBL:  Total I/O In: 1000 [I.V.:1000] Out: 1150 [Urine:150; Blood:1000]  BLOOD ADMINISTERED:none  DRAINS: Urinary Catheter (Foley)   LOCAL MEDICATIONS USED:  NONE  SPECIMEN:  Source of Specimen:  Placenta  DISPOSITION OF SPECIMEN:  L&D  COUNTS:  YES  TOURNIQUET:  * No tourniquets in log *  DICTATION: .Other Dictation: Dictation Number [id number]  PLAN OF CARE: Admit to inpatient   PATIENT DISPOSITION:  PACU - hemodynamically stable.   Delay start of Pharmacological VTE agent (>24hrs) due to surgical blood loss or risk of bleeding: not applicable

## 2016-01-27 NOTE — Anesthesia Preprocedure Evaluation (Signed)
Anesthesia Evaluation  Patient identified by MRN, date of birth, ID band Patient awake    Reviewed: Allergy & Precautions, NPO status , Patient's Chart, lab work & pertinent test results  History of Anesthesia Complications Negative for: history of anesthetic complications  Airway Mallampati: III  TM Distance: >3 FB Neck ROM: Full    Dental no notable dental hx. (+) Dental Advisory Given   Pulmonary neg pulmonary ROS,    Pulmonary exam normal breath sounds clear to auscultation       Cardiovascular negative cardio ROS Normal cardiovascular exam Rhythm:Regular Rate:Normal     Neuro/Psych PSYCHIATRIC DISORDERS Anxiety Depression negative neurological ROS     GI/Hepatic Neg liver ROS, GERD  Controlled,  Endo/Other  diabetes, Gestational, Oral Hypoglycemic AgentsMorbid obesity  Renal/GU negative Renal ROS  negative genitourinary   Musculoskeletal negative musculoskeletal ROS (+)   Abdominal   Peds negative pediatric ROS (+)  Hematology negative hematology ROS (+)   Anesthesia Other Findings   Reproductive/Obstetrics (+) Pregnancy                             Anesthesia Physical Anesthesia Plan  ASA: III  Anesthesia Plan: Spinal   Post-op Pain Management:    Induction:   Airway Management Planned:   Additional Equipment:   Intra-op Plan:   Post-operative Plan:   Informed Consent: I have reviewed the patients History and Physical, chart, labs and discussed the procedure including the risks, benefits and alternatives for the proposed anesthesia with the patient or authorized representative who has indicated his/her understanding and acceptance.   Dental advisory given  Plan Discussed with: CRNA  Anesthesia Plan Comments:         Anesthesia Quick Evaluation

## 2016-01-27 NOTE — Progress Notes (Signed)
Circulator and anesthesia notified of c-section scheduled for 2030.

## 2016-01-27 NOTE — Transfer of Care (Signed)
Immediate Anesthesia Transfer of Care Note  Patient: Brooke Marks  Procedure(s) Performed: Procedure(s) with comments: CESAREAN SECTION (N/A) - MD requests RNFA  Patient Location: PACU  Anesthesia Type:Spinal  Level of Consciousness: awake, alert  and oriented  Airway & Oxygen Therapy: Patient Spontanous Breathing  Post-op Assessment: Report given to RN and Post -op Vital signs reviewed and stable  Post vital signs: Reviewed and stable  Last Vitals:  Vitals:   01/27/16 1900 01/27/16 2158  BP: 131/81 (!) 131/106  Pulse: 98 87  Resp:  (!) 21  Temp:  36.5 C    Last Pain:  Vitals:   01/27/16 1724  TempSrc:   PainSc: 0-No pain         Complications: No apparent anesthesia complications

## 2016-01-27 NOTE — MAU Provider Note (Signed)
History     CSN: [id number]  Arrival date and time: 01/27/16 1712   First Provider Initiated Contact with Patient 01/27/16 1747       Chief Complaint  Patient presents with  . Hypertension   HPI Tallula Doreatha Lew is a 35 y.o. G3P0020 at [redacted]w[redacted]d who presents sent from office for BP evaluation. Denies hx of hypertension. In office today BP was 180/100. Denies headache, vision changes, epigastric pain, CP, SOB. Pt keeps on saying that she is stressed out and that she does not have high blood pressure and she just needs to go home.   OB History    Gravida Para Term Preterm AB Living   3       2 0   SAB TAB Ectopic Multiple Live Births     2            Past Medical History:  Diagnosis Date  . Anxiety   . Chlamydia   . Depression   . Diabetes mellitus without complication ([place])    Gestational    Past Surgical History:  Procedure Laterality Date  . WISDOM TOOTH EXTRACTION      Family History  Problem Relation Age of Onset  . Diabetes Mother   . Hypertension Father     Social History  Substance Use Topics  . Smoking status: Never Smoker  . Smokeless tobacco: Never Used  . Alcohol use No    Allergies: No Known Allergies  Prescriptions Prior to Admission  Medication Sig Dispense Refill Last Dose  . folic acid (V-R FOLIC ACID) A999333 MCG tablet Take 1 tablet (400 mcg total) by mouth daily. (Patient not taking: Reported on 06/14/2015) 30 tablet 2   . sertraline (ZOLOFT) 100 MG tablet Take 1 tablet (100 mg total) by mouth daily. (Patient not taking: Reported on 06/14/2015) 30 tablet 0     Review of Systems  Constitutional: Negative.   Eyes: Negative for blurred vision.  Respiratory: Negative.   Cardiovascular: Negative.   Neurological: Negative for headaches.   Physical Exam   Blood pressure (!) 160/107, pulse 106, temperature 97.8 F (36.6 C), temperature source Oral, resp. rate 18, height 5\' 2"  (1.575 m), weight 243 lb (110.2 kg), last menstrual period  05/11/2015.  Temp:  [97.8 F (36.6 C)] 97.8 F (36.6 C) (09/07 1719) Pulse Rate:  [89-106] 99 (09/07 1845) Resp:  [18] 18 (09/07 1719) BP: (119-165)/(76-107) 131/85 (09/07 1845) Weight:  [243 lb (110.2 kg)] 243 lb (110.2 kg) (09/07 1719)  Physical Exam  Nursing note and vitals reviewed. Constitutional: She is oriented to person, place, and time. She appears well-developed and well-nourished. No distress.  HENT:  Head: Normocephalic and atraumatic.  Eyes: Conjunctivae are normal. Right eye exhibits no discharge. Left eye exhibits no discharge. No scleral icterus.  Neck: Normal range of motion.  Cardiovascular: Normal rate.   Respiratory: Effort normal. No respiratory distress.  GI: Soft. There is no tenderness.  Musculoskeletal: She exhibits edema.  Neurological: She is alert and oriented to person, place, and time. She has normal reflexes.  Skin: Skin is warm and dry. She is not diaphoretic.  Psychiatric: She has a normal mood and affect. Her behavior is normal. Judgment and thought content normal.   Fetal Tracing:  Baseline: 150 Variability: moderate Accelerations: 15x15 Decelerations: none  Toco: none MAU Course  Procedures Results for orders placed or performed during the hospital encounter of 01/27/16 (from the past 24 hour(s))  Protein / creatinine ratio, urine  Status: Abnormal   Collection Time: 01/27/16  5:15 PM  Result Value Ref Range   Creatinine, Urine 121.00 mg/dL   Total Protein, Urine 39 mg/dL   Protein Creatinine Ratio 0.32 (H) 0.00 - 0.15 mg/mg[Cre]  Urinalysis, Routine w reflex microscopic (not at [facility])     Status: Abnormal   Collection Time: 01/27/16  5:15 PM  Result Value Ref Range   Color, Urine YELLOW YELLOW   APPearance CLEAR CLEAR   Specific Gravity, Urine 1.015 1.005 - 1.030   pH 7.0 5.0 - 8.0   Glucose, UA NEGATIVE NEGATIVE mg/dL   Hgb urine dipstick TRACE (A) NEGATIVE   Bilirubin Urine NEGATIVE NEGATIVE   Ketones, ur NEGATIVE NEGATIVE  mg/dL   Protein, ur NEGATIVE NEGATIVE mg/dL   Nitrite NEGATIVE NEGATIVE   Leukocytes, UA SMALL (A) NEGATIVE  Urine microscopic-add on     Status: Abnormal   Collection Time: 01/27/16  5:15 PM  Result Value Ref Range   Squamous Epithelial / LPF 6-30 (A) NONE SEEN   WBC, UA 6-30 0 - 5 WBC/hpf   RBC / HPF 0-5 0 - 5 RBC/hpf   Bacteria, UA FEW (A) NONE SEEN  CBC     Status: Abnormal   Collection Time: 01/27/16  5:51 PM  Result Value Ref Range   WBC 8.8 4.0 - 10.5 K/uL   RBC 4.76 3.87 - 5.11 MIL/uL   Hemoglobin 12.2 12.0 - 15.0 g/dL   HCT 36.0 36.0 - 46.0 %   MCV 75.6 (L) 78.0 - 100.0 fL   MCH 25.6 (L) 26.0 - 34.0 pg   MCHC 33.9 30.0 - 36.0 g/dL   RDW 17.1 (H) 11.5 - 15.5 %   Platelets 216 150 - 400 K/uL  Comprehensive metabolic panel     Status: Abnormal   Collection Time: 01/27/16  5:51 PM  Result Value Ref Range   Sodium 136 135 - 145 mmol/L   Potassium 3.4 (L) 3.5 - 5.1 mmol/L   Chloride 107 101 - 111 mmol/L   CO2 21 (L) 22 - 32 mmol/L   Glucose, Bld 96 65 - 99 mg/dL   BUN 7 6 - 20 mg/dL   Creatinine, Ser 0.52 0.44 - 1.00 mg/dL   Calcium 8.4 (L) 8.9 - 10.3 mg/dL   Total Protein 6.2 (L) 6.5 - 8.1 g/dL   Albumin 2.9 (L) 3.5 - 5.0 g/dL   AST 16 15 - 41 U/L   ALT 11 (L) 14 - 54 U/L   Alkaline Phosphatase 55 38 - 126 U/L   Total Bilirubin 0.2 (L) 0.3 - 1.2 mg/dL   GFR calc non Af Amer >60 >60 mL/min   GFR calc Af Amer >60 >60 mL/min   Anion gap 8 5 - 15  Lactate dehydrogenase     Status: None   Collection Time: 01/27/16  5:51 PM  Result Value Ref Range   LDH 148 98 - 192 U/L  Uric acid     Status: None   Collection Time: 01/27/16  5:51 PM  Result Value Ref Range   Uric Acid, Serum 4.1 2.3 - 6.6 mg/dL    MDM CBC, CMP, LDH, uric acid, urine PCR First 2 BPs severe range; remaining BPs WNL Dr. Melba Coon on unit to discuss POC with patient  Assessment and Plan  A: Preeclampsia, third trimester  P: Dr. Melba Coon discussed admission & delivery with patient   Jorje Guild 01/27/2016, 5:47 PM

## 2016-01-27 NOTE — Anesthesia Postprocedure Evaluation (Signed)
Anesthesia Post Note  Patient: Brooke Marks  Procedure(s) Performed: Procedure(s) (LRB): CESAREAN SECTION (N/A)  Patient location during evaluation: PACU Anesthesia Type: Spinal Level of consciousness: awake and alert Pain management: pain level controlled Vital Signs Assessment: post-procedure vital signs reviewed and stable Respiratory status: spontaneous breathing, nonlabored ventilation, respiratory function stable and patient connected to nasal cannula oxygen Cardiovascular status: blood pressure returned to baseline and stable Postop Assessment: no signs of nausea or vomiting, spinal receding, patient able to bend at knees, no headache and no backache Anesthetic complications: no     Last Vitals:  Vitals:   01/27/16 2315 01/27/16 2320  BP: (!) 151/102   Pulse: 99 91  Resp: 15 15  Temp: (!) 35.9 C     Last Pain:  Vitals:   01/27/16 2323  TempSrc:   PainSc: 7    Pain Goal:    LLE Motor Response: Purposeful movement (01/27/16 2315) LLE Sensation: Tingling (01/27/16 2315) RLE Motor Response: Purposeful movement (01/27/16 2315) RLE Sensation: Tingling (01/27/16 2315)      Karla Pavone JENNETTE

## 2016-01-27 NOTE — Consult Note (Signed)
Neonatology Note:   Attendance at C-section:    I was asked by Dr. Sandford Craze to attend this primary C/S at 38 weeks due to [facility]. The mother is a G3P0A2 B pos, GBS neg with GDM, on Glyburide, and gestational HTN. ROM at delivery, fluid clear. Infant vigorous with good spontaneous cry and tone. Delayed cord clamping was done. Needed only minimal bulb suctioning. Ap 8/9. Lungs clear to ausc in DR. To CN to care of Pediatrician.   Real Cons, MD

## 2016-01-27 NOTE — MAU Note (Signed)
Pt sent from office for PIH eval. Pt denies any H/A, Diziness or blurred vision. Pedal edema noted.

## 2016-01-28 ENCOUNTER — Encounter (HOSPITAL_COMMUNITY): Payer: Self-pay | Admitting: *Deleted

## 2016-01-28 LAB — CBC
HEMATOCRIT: 31.8 % — AB (ref 36.0–46.0)
HEMOGLOBIN: 10.5 g/dL — AB (ref 12.0–15.0)
MCH: 24.9 pg — AB (ref 26.0–34.0)
MCHC: 33 g/dL (ref 30.0–36.0)
MCV: 75.5 fL — AB (ref 78.0–100.0)
Platelets: 201 10*3/uL (ref 150–400)
RBC: 4.21 MIL/uL (ref 3.87–5.11)
RDW: 17.2 % — ABNORMAL HIGH (ref 11.5–15.5)
WBC: 15.1 10*3/uL — ABNORMAL HIGH (ref 4.0–10.5)

## 2016-01-28 LAB — RPR: RPR: NONREACTIVE

## 2016-01-28 MED ORDER — NIFEDIPINE ER OSMOTIC RELEASE 30 MG PO TB24
30.0000 mg | ORAL_TABLET | Freq: Two times a day (BID) | ORAL | Status: DC
  Administered 2016-01-28 – 2016-01-30 (×5): 30 mg via ORAL
  Filled 2016-01-28 (×5): qty 1

## 2016-01-28 NOTE — Anesthesia Postprocedure Evaluation (Signed)
Anesthesia Post Note  Patient: Brooke Marks  Procedure(s) Performed: Procedure(s) (LRB): CESAREAN SECTION (N/A)  Patient location during evaluation: Mother Baby Anesthesia Type: Spinal Level of consciousness: awake Pain management: pain level controlled Vital Signs Assessment: post-procedure vital signs reviewed and stable Respiratory status: spontaneous breathing Cardiovascular status: stable Postop Assessment: no headache, no backache, spinal receding and patient able to bend at knees Anesthetic complications: no     Last Vitals:  Vitals:   01/28/16 0349 01/28/16 0354  BP: (!) 146/99 (!) 155/95  Pulse: (!) 113 (!) 117  Resp:    Temp:      Last Pain:  Vitals:   01/28/16 0527  TempSrc:   PainSc: 0-No pain   Pain Goal:                 Everette Rank

## 2016-01-28 NOTE — Progress Notes (Signed)
Patient's blood pressure noted to be 168/100, pulse 122. No c/o of headache, flank pain, or visual disturbance. Voice message left for Dr. Marvel Plan, physician on call. Patient was sitting up and she laid back in the bed and I will obtain her blood pressure in 15 minutes.

## 2016-01-28 NOTE — Lactation Note (Signed)
This note was copied from a baby's chart. Lactation Consultation Note  Patient Name: Brooke Marks [id number] Date: 01/28/2016 Reason for consult: Follow-up assessment Baby at 20 hr of life. Mom has baby in football hold, latched well. Baby is sleepy, demonstrated how to stimulate baby to keep her sucking. Mom asked if she should bottle feed baby because baby is sleepy. Discussed baby behavior, feeding frequency, baby belly size, risks of formula, use of artificial nipples, voids, wt loss, breast changes, and nipple care. Mom is aware of lactation services and support group. She will call as needed.    Maternal Data Has patient been taught Hand Expression?: Yes  Feeding Feeding Type: Breast Fed Nipple Type: Slow - flow  LATCH Score/Interventions Latch: Grasps breast easily, tongue down, lips flanged, rhythmical sucking. Intervention(s): Waking techniques Intervention(s): Breast compression;Breast massage  Audible Swallowing: A few with stimulation Intervention(s): Skin to skin;Hand expression Intervention(s): Skin to skin;Hand expression  Type of Nipple: Flat Intervention(s): Shells (nipple shields)  Comfort (Breast/Nipple): Soft / non-tender     Hold (Positioning): Assistance needed to correctly position infant at breast and maintain latch. Intervention(s): Position options;Support Pillows  LATCH Score: 7  Lactation Tools Discussed/Used Tools: Nipple Shields Nipple shield size: 24 Shell Type: Inverted Breast pump type: Double-Electric Breast Pump (scant yield w/pumping on the flange- mom apply it to nipples) WIC Program: No Pump Review: Setup, frequency, and cleaning Initiated by:: MAI  Date initiated:: 01/28/16   Consult Status Consult Status: Follow-up Date: 01/29/16 Follow-up type: In-patient    Denzil Hughes 01/28/2016, 5:10 PM

## 2016-01-28 NOTE — Progress Notes (Signed)
PACU RN, [place] states she informed Dr. Melba Coon of patient's PACU & MBU admission Blood Pressures and was told Dr. Melba Coon said she was ok with patient's blood pressure.  Will continue to monitor.

## 2016-01-28 NOTE — Clinical Social Work Note (Signed)
   CLINICAL SOCIAL WORK MATERNAL/CHILD NOTE  Patient Details  Name: Brooke Marks MRN: [id number] Date of Birth: 01/27/2014  Date:  01/28/2016  Clinical Social Worker Initiating Note:  Laurey Arrow Date/ Time Initiated:  01/28/16/1437     Child's Name:  Brooke Marks   Legal Guardian:  Mother   Need for Interpreter:  None   Date of Referral:  01/28/16     Reason for Referral:  Behavioral Health Issues, including SI    Referral Source:  [facility]   Address:  [address] [place] [address]  Phone number:  [phone]   Household Members:  Self, Parents, Siblings, Significant Other   Natural Supports (not living in the home):  Immediate Family, Spouse/significant other, Parent   Professional Supports: None   Employment: Full-time   Type of Work: Therapist, art   Education:  Engineer, maintenance Resources:  Multimedia programmer   Other Resources:      Cultural/Religious Considerations Which May Impact Care:  None Reported  Strengths:  Ability to meet basic needs , Home prepared for child , Pediatrician chosen    Risk Factors/Current Problems:  Mental Health Concerns    Cognitive State:  Alert , Insightful , Goal Oriented    Mood/Affect:  Bright , Interested , Comfortable , Happy    CSW Assessment: CSW met with [name] to to complete an assessment for hx of anxiety.  [name] was polite and inviting.  [name] introduced her room guest as her mom and gave CSW permission to meet with [name] while [name]'s mom was present. CSW inquired about [name]'s mental health hx and [name] acknowledged a hx of anxiety in 2005 after the passing of [name]'s father and illness of [name]'s mother.  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  CSW offered [name] outpatient resources and [name] declined. CSW educated [name] and about PPD. CSW informed [name] of possible supports and interventions to decrease PPD.  CSW also encouraged [name] to seek medical attention if needed for  increased signs and symptoms of PPD. CSW reviewed safe sleep and SIDS. [name] was knowledgeable and asked appropriate questions.  [name] communicated that [name] has everything she needs for the baby and is prepared to meet her infant's needs.  [name] did not have any further questions, concerns, or needs at this time, and CSW thanked [name] for allowing CSW to meet with her.  CSW Plan/Description:  Patient/Family Education , No Further Intervention Required/No Barriers to Discharge   Laurey Arrow, MSW, [place] Work [phone]

## 2016-01-28 NOTE — Op Note (Signed)
Brooke Marks, Brooke Marks NO.:  [id number]  MEDICAL RECORD NO.:  [id number]  LOCATION:  9103                          FACILITY:  [facility]  PHYSICIAN:  Thornell Sartorius, MD        DATE OF BIRTH:  03/10/1981  DATE OF PROCEDURE:  01/27/2016 DATE OF DISCHARGE:                              OPERATIVE REPORT   PREOPERATIVE DIAGNOSES:  Intrauterine pregnancy at 38+ weeks, the patient desires primary low-transverse cesarean section, pregnancy- induced hypertension, gestational diabetes.  POSTOPERATIVE DIAGNOSES:  Intrauterine pregnancy at 38+ weeks, the patient desires primary low-transverse cesarean section, pregnancy- induced hypertension, gestational diabetes, delivered.  PROCEDURE:  Primary low-transverse cesarean section.  SURGEON:  Thornell Sartorius, MD.  FINDINGS:  Viable female infant at 9:05 p.m. with Apgars of 8 and 9 at 1 and 5 minutes respectively.  Weight pending at the time of dictation. Normal uterus, tubes, and ovaries are also noted.  ANESTHESIA:  Spinal.  IV FLUIDS:  1000 mL.  URINE OUTPUT:  150 mL, clear urine at the end of the procedure.  ESTIMATED BLOOD LOSS:  Approximately 1000 mL.  COMPLICATIONS:  None.  PATHOLOGY:  Placenta to L and D.  DESCRIPTION OF PROCEDURE:  After informed consent was reviewed with the patient including risks, benefits, and alternatives of surgical procedure, she was transported to the operating room, where spinal anesthesia was then placed and found to be adequate.  She was then returned to supine position with a leftward tilt.  She was prepped and draped in the normal sterile fashion.  A Foley catheter was sterilely placed.  A Pfannenstiel skin incision was made at the level of approximately 2 fingerbreadths above her pubic symphysis and carried through the underlying layer of fascia sharply.  The fascia was incised in the midline.  The incision was extended laterally with Mayo scissors. Superior aspect of the fascial incision  was grasped Kocher clamps, elevated, and the rectus muscles were dissected off both bluntly and sharply.  Midline was easily identified.  The peritoneum was entered bluntly.  Peritoneal incision was extended superiorly and inferiorly with good visualization of the bladder.  Alexis skin retractor was placed carefully making sure that no bowel was entrapped.  The uterus was incised in a transverse fashion, and the infant was delivered from a vertex presentation.  She had a mild shoulder dystocia.  The nose and mouth were suctioned on the field.  Cord was clamped and cut.  Infant was handed off to the waiting pediatric staff.  The cord was clamped after a minute delay.  The placenta was expressed from the uterus. Uterus was cleared of all clots and debris.  Uterine incision was closed in 2 layers with 0 Monocryl, first of which is running locked and the second is an imbricating layer.  The gutters were found to be hemostatic and the uterus was inspected and found to be normal.  The Alexis retractor was then removed.  The peritoneum was reapproximated with 2-0 Vicryl.  The fascial incision was closed with 0 Vicryl in a running fashion.  The subcuticular adipose layer was made hemostatic with Bovie cautery.  The dead space was closed with 2-0 plain gut.  The skin  was closed with 4-0 Vicryl on a Keith needle.  Benzoin and Steri-Strips were applied.  The patient tolerated the procedure well and was transferred in stable condition to PACU.     Thornell Sartorius, MD     JB/MEDQ  D:  01/27/2016  T:  01/28/2016  Job:  [id number]

## 2016-01-28 NOTE — Progress Notes (Signed)
Spoke to Dr. Marvel Plan to report follow up blood pressure of 123/73, pulse 104. No new orders at this time.

## 2016-01-28 NOTE — Lactation Note (Signed)
This note was copied from a baby's chart. Lactation Consultation Note  Patient Name: Brooke Marks [id number] Date: 01/28/2016 Reason for consult: Initial assessment;Difficult latch (elevated B/P's , and increased edema - areolas / 3-11p LC aware to see patient for latch assessment )  Baby is 18 hours old, ;last  Fed at 1330 with #20 NS and supplemented after wards with bottle - per [facility] RN NIcole Ronnald Ramp.  Baby has been to the breast with #20 NS and per mom saw colostrum in the NS.  Baby presently sleeping in bedside crib. LC assessed breast tissue and noted tough areolas, and not compressible at 1st. After reverse pressure and resizing for NS  More compressible. LC re-sized NS and felt the #20 NS wasn't a good fit due to not accommodating the areola limited amount. #24 NS was a better fit and accommodated  The areola. LC had mom repeat application and after 3 tries mom was able to fit probably.  @ consult LC instructed mom on the use of of DEBP , increasing flange to #27 due to the #24 being to snug at the base of the nipple / areola complex.  Mom pumped for 20 mins with scant amount of EBM on the right nipple . LC had mom apply to nipples.  [facility] RN Mabeline Caras aware of the BF plan, and the need for consistent pumping, and shells . Also 3-11p LC aware to see this mom for feeding assessment.  LC plan -  Prepare all BF tools to be used 1st , curved tip syringe with EBM or formula  Proper pillow support for height , apply #24 NS , and instill EBM or formula into the top of the NS  Use the Football position - latch ( [place] or RN to check depth. Baby needs to be in a participating  ( intermittent feeding pattern with swallows)  Do the same for other breast and above steps.  After baby is settled - post pump both breast for 10 -20 mins - save milk and save for next feeding.        Maternal Data Has patient been taught Hand Expression?: Yes  Feeding  LATCH Score/Interventions                 Intervention(s): Breastfeeding basics reviewed     Lactation Tools Discussed/Used Tools: Shells Nipple shield size: 20;24;Other (comment) (#24 NS better fit. RN on 11-7 started a #20 NS , baby didn't feed , resized NS ) Shell Type: Inverted WIC Program: No Pump Review: Setup, frequency, and cleaning Initiated by:: MAI  Date initiated:: 01/28/16   Consult Status Consult Status: Follow-up Date: 01/28/16 Follow-up type: In-patient    Myer Haff 01/28/2016, 3:53 PM

## 2016-01-28 NOTE — Progress Notes (Signed)
Subjective: Postpartum Day 1: Cesarean Delivery Patient reports incisional pain and tolerating PO.  Pt denies HA or PIH sx.  Baby nursing well.  Objective: Vital signs in last 24 hours: Temp:  [96.6 F (35.9 C)-98.3 F (36.8 C)] 98 F (36.7 C) (09/08 0342) Pulse Rate:  [87-117] 117 (09/08 0354) Resp:  [12-33] 16 (09/08 0342) BP: (119-165)/(76-107) 148/96 (09/08 0735) SpO2:  [96 %-100 %] 100 % (09/08 0342) Weight:  [110.2 kg (243 lb)] 110.2 kg (243 lb) (09/07 1719)  Physical Exam:  General: alert and cooperative Lochia: appropriate Uterine Fundus: firm Incision: C/D/I     Recent Labs  01/27/16 1751 01/28/16 0606  HGB 12.2 10.5*  HCT 36.0 31.8*    Assessment/Plan: Status post Cesarean section. Doing well postoperatively.  BP elevated c/w mild preeclampsia dx and will start procardia XL po BID Pt denies sx, recheck labs in AM  Willian Donson W 01/28/2016, 9:14 AM

## 2016-01-28 NOTE — Discharge Summary (Addendum)
OB Discharge Summary     Patient Name: Brooke Marks DOB: Jan 23, 1981 MRN: [id number]  Date of admission: 01/27/2016 Delivering MD: Janyth Contes   Date of discharge:01/30/2016  Admitting diagnosis: Mild preeclampsia                                     Gestational Diabetes                                     Requests primary elective                                                       c-section Intrauterine pregnancy: [redacted]w[redacted]d     Secondary diagnosis:  Principal Problem:   S/P cesarean section Active Problems:   PIH (pregnancy induced hypertension)  Additional problems: none     Discharge diagnosis: Term Pregnancy Delivered and Preeclampsia (mild)                                                                                                Post partum procedures:antihypertensives    Complications: None  Hospital course:  Sceduled C/S   35 y.o. yo [id number] at [redacted]w[redacted]d was admitted to the hospital 01/27/2016 for scheduled cesarean section with the following indication:Elective Primary.  Membrane Rupture Time/Date: 9:02 PM ,01/27/2016   Patient delivered a Viable infant.01/27/2016  Details of operation can be found in separate operative note.  Pateint had an uncomplicated postpartum course.  She is ambulating, tolerating a regular diet, passing flatus, and urinating well. Patient is discharged home in stable condition on  01/29/16          Physical exam  Vitals:   01/28/16 1915 01/28/16 1931 01/28/16 2110 01/29/16 0500  BP: 123/73 140/85 (!) 142/94 139/90  Pulse: (!) 104  86 (!) 114  Resp:   18 16  Temp:   98.1 F (36.7 C) 98.4 F (36.9 C)  TempSrc:   Oral Oral  SpO2:   98%   Weight:      Height:       General: alert and cooperative Lochia: appropriate Uterine Fundus: firm Incision: Dressing is clean, dry, and intact  Labs: Lab Results  Component Value Date   WBC 15.1 (H) 01/28/2016   HGB 10.5 (L) 01/28/2016   HCT 31.8 (L) 01/28/2016   MCV 75.5 (L)  01/28/2016   PLT 201 01/28/2016   CMP Latest Ref Rng & Units 01/27/2016  Glucose 65 - 99 mg/dL 96  BUN 6 - 20 mg/dL 7  Creatinine 0.44 - 1.00 mg/dL 0.52  Sodium 135 - 145 mmol/L 136  Potassium 3.5 - 5.1 mmol/L 3.4(L)  Chloride 101 - 111 mmol/L 107  CO2 22 - 32 mmol/L 21(L)  Calcium 8.9 - 10.3 mg/dL 8.4(L)  Total  Protein 6.5 - 8.1 g/dL 6.2(L)  Total Bilirubin 0.3 - 1.2 mg/dL 0.2(L)  Alkaline Phos 38 - 126 U/L 55  AST 15 - 41 U/L 16  ALT 14 - 54 U/L 11(L)    Discharge instruction: per After Visit Summary and "Baby and Me Booklet".  After visit meds:    Medication List    STOP taking these medications   glyBURIDE 2.5 MG tablet Commonly known as:  DIABETA     TAKE these medications   ibuprofen 800 MG tablet Commonly known as:  ADVIL,MOTRIN Take 1 tablet (800 mg total) by mouth every 8 (eight) hours.   NIFEdipine 30 MG 24 hr tablet Commonly known as:  PROCARDIA-XL/ADALAT CC Take 1 tablet (30 mg total) by mouth 2 (two) times daily.   oxyCODONE 5 MG immediate release tablet Commonly known as:  Oxy IR/ROXICODONE Take 1 tablet (5 mg total) by mouth every 4 (four) hours as needed (pain scale 4-7).   prenatal multivitamin Tabs tablet Take 1 tablet by mouth at bedtime.       Diet: routine diet  Activity: Advance as tolerated. Pelvic rest for 6 weeks.   Outpatient follow up:2 weeks Follow up Appt:No future appointments. Follow up Visit:No Follow-up on file.  Postpartum contraception: Undecided  Newborn Data: Live born female  Birth Weight: 9 lb 2.4 oz (4150 g) APGAR: 8, 9  Baby Feeding: Breast Disposition:home with mother   Addendum:  Pt had to stay an additional day for baby to complete phototherapy for jaundice, no changes in her postpartum course since yesterday.   01/29/2016 Logan Bores, MD

## 2016-01-29 LAB — COMPREHENSIVE METABOLIC PANEL
ALT: 14 U/L (ref 14–54)
AST: 20 U/L (ref 15–41)
Albumin: 2.7 g/dL — ABNORMAL LOW (ref 3.5–5.0)
Alkaline Phosphatase: 55 U/L (ref 38–126)
Anion gap: 8 (ref 5–15)
BUN: 5 mg/dL — ABNORMAL LOW (ref 6–20)
CALCIUM: 8.1 mg/dL — AB (ref 8.9–10.3)
CHLORIDE: 106 mmol/L (ref 101–111)
CO2: 22 mmol/L (ref 22–32)
CREATININE: 0.46 mg/dL (ref 0.44–1.00)
Glucose, Bld: 118 mg/dL — ABNORMAL HIGH (ref 65–99)
POTASSIUM: 3.6 mmol/L (ref 3.5–5.1)
Sodium: 136 mmol/L (ref 135–145)
Total Bilirubin: 0.5 mg/dL (ref 0.3–1.2)
Total Protein: 6.3 g/dL — ABNORMAL LOW (ref 6.5–8.1)

## 2016-01-29 LAB — CBC
HCT: 32.3 % — ABNORMAL LOW (ref 36.0–46.0)
Hemoglobin: 10.7 g/dL — ABNORMAL LOW (ref 12.0–15.0)
MCH: 25.1 pg — AB (ref 26.0–34.0)
MCHC: 33.1 g/dL (ref 30.0–36.0)
MCV: 75.6 fL — AB (ref 78.0–100.0)
PLATELETS: 177 10*3/uL (ref 150–400)
RBC: 4.27 MIL/uL (ref 3.87–5.11)
RDW: 17.3 % — AB (ref 11.5–15.5)
WBC: 12.9 10*3/uL — AB (ref 4.0–10.5)

## 2016-01-29 LAB — BIRTH TISSUE RECOVERY COLLECTION (PLACENTA DONATION)

## 2016-01-29 MED ORDER — IBUPROFEN 800 MG PO TABS: 800.0000 mg | ORAL_TABLET | Freq: Three times a day (TID) | ORAL | 0 refills | Status: DC

## 2016-01-29 MED ORDER — OXYCODONE HCL 5 MG PO TABS: 5.0000 mg | ORAL_TABLET | ORAL | 0 refills | Status: DC | PRN

## 2016-01-29 MED ORDER — NIFEDIPINE ER 30 MG PO TB24: 30.0000 mg | ORAL_TABLET | Freq: Two times a day (BID) | ORAL | 0 refills | Status: DC

## 2016-01-29 NOTE — Lactation Note (Signed)
This note was copied from a baby's chart. Lactation Consultation Note  Patient Name: Girl Loreen Tyma Today's Date: 01/29/2016   Baby 44 hrs old, under single phototherapy.  Serum bili to be repeated tonight at [place] being fed formula by bottle.  Set up DEBP at bedside by RN.  Mom states she has pumped once.  Encouraged her to pump at every feeding, after breast and formula supplement.  Mom sitting on side of bed, complaining of discomfort.  Denied needing any assistance.  To call for help as needed, and LC to follow up in am.      Broadus John 01/29/2016, 3:08 PM

## 2016-01-29 NOTE — Progress Notes (Signed)
Subjective: Postpartum Day 2: Cesarean Delivery Patient reports tolerating PO, + flatus and no problems voiding.  Baby nursing well.  Desires d/c home today if baby able to go.  Slight jaundice  Objective: Vital signs in last 24 hours: Temp:  [98.1 F (36.7 C)] 98.1 F (36.7 C) (09/08 2110) Pulse Rate:  [86-122] 86 (09/08 2110) Resp:  [18] 18 (09/08 2110) BP: (123-168)/(73-100) 142/94 (09/08 2110) SpO2:  [98 %] 98 % (09/08 2110)  Physical Exam:  General: alert and cooperative Lochia: appropriate Uterine Fundus: firm Incision: C/D/I    Recent Labs  01/27/16 1751 01/28/16 0606  HGB 12.2 10.5*  HCT 36.0 31.8*    Assessment/Plan: Status post Cesarean section. Doing well postoperatively.  BP stable on procardia XL BID  Discharge home if baby able to gowith standard precautions and return to office in 4 days for BP check.  Logan Bores 01/29/2016, 5:28 AM

## 2016-01-30 NOTE — Progress Notes (Signed)
Subjective: Postpartum Day 3 Cesarean Delivery Patient reports tolerating PO and no problems voiding.  Pt just ready to go home.  Had to stay for baby yesterday being under bililights.  Objective: Vital signs in last 24 hours: Temp:  [98.1 F (36.7 C)-98.3 F (36.8 C)] 98.3 F (36.8 C) (09/10 0610) Pulse Rate:  [107-120] 107 (09/10 0610) Resp:  [16-18] 16 (09/10 0610) BP: (133-139)/(84-87) 139/87 (09/10 0610) SpO2:  [100 %] 100 % (09/09 1715)  Physical Exam:  General: alert and cooperative Lochia: appropriate Uterine Fundus: firm Incision: C/D/I    Recent Labs  01/28/16 0606 01/29/16 0704  HGB 10.5* 10.7*  HCT 31.8* 32.3*    Assessment/Plan: Status post Cesarean section. Doing well postoperatively.  Discharge home with standard precautions and return to office in 4 days for BP check BP stable on procardia  Amerah Puleo W 01/30/2016, 9:27 AM

## 2016-02-01 ENCOUNTER — Inpatient Hospital Stay (HOSPITAL_COMMUNITY)
Admission: RE | Admit: 2016-02-01 | Payer: Commercial Managed Care - HMO | Source: Ambulatory Visit | Admitting: Obstetrics and Gynecology

## 2016-02-02 ENCOUNTER — Encounter (HOSPITAL_COMMUNITY): Payer: Self-pay | Admitting: Obstetrics and Gynecology

## 2016-10-02 ENCOUNTER — Emergency Department (HOSPITAL_COMMUNITY)
Admission: EM | Admit: 2016-10-02 | Discharge: 2016-10-02 | Disposition: A | Discharge status: Home / Self Care | Payer: Commercial Managed Care - HMO | Attending: Emergency Medicine | Admitting: Emergency Medicine

## 2016-10-02 ENCOUNTER — Other Ambulatory Visit: Payer: Self-pay

## 2016-10-02 ENCOUNTER — Ambulatory Visit (INDEPENDENT_AMBULATORY_CARE_PROVIDER_SITE_OTHER): Payer: Commercial Managed Care - HMO | Admitting: Obstetrics and Gynecology

## 2016-10-02 ENCOUNTER — Ambulatory Visit (HOSPITAL_COMMUNITY)
Admission: RE | Admit: 2016-10-02 | Discharge: 2016-10-02 | Disposition: A | Discharge status: Home / Self Care | Payer: Commercial Managed Care - HMO | Source: Ambulatory Visit | Attending: Family Medicine | Admitting: Family Medicine

## 2016-10-02 ENCOUNTER — Encounter: Payer: Self-pay | Admitting: Obstetrics and Gynecology

## 2016-10-02 ENCOUNTER — Encounter (HOSPITAL_COMMUNITY): Payer: Self-pay

## 2016-10-02 ENCOUNTER — Emergency Department (HOSPITAL_COMMUNITY): Payer: Commercial Managed Care - HMO

## 2016-10-02 VITALS — BP 150/88 | HR 100 | Temp 97.7°F | Ht 63.0 in | Wt 177.6 lb

## 2016-10-02 DIAGNOSIS — E119 Type 2 diabetes mellitus without complications: Secondary | ICD-10-CM | POA: Insufficient documentation

## 2016-10-02 DIAGNOSIS — F410 Panic disorder [episodic paroxysmal anxiety] without agoraphobia: Secondary | ICD-10-CM

## 2016-10-02 DIAGNOSIS — R079 Chest pain, unspecified: Secondary | ICD-10-CM

## 2016-10-02 DIAGNOSIS — R030 Elevated blood-pressure reading, without diagnosis of hypertension: Secondary | ICD-10-CM

## 2016-10-02 DIAGNOSIS — R0789 Other chest pain: Secondary | ICD-10-CM

## 2016-10-02 DIAGNOSIS — Z79899 Other long term (current) drug therapy: Secondary | ICD-10-CM | POA: Insufficient documentation

## 2016-10-02 LAB — CBC
HEMATOCRIT: 40.8 % (ref 36.0–46.0)
Hemoglobin: 13.5 g/dL (ref 12.0–15.0)
MCH: 26.2 pg (ref 26.0–34.0)
MCHC: 33.1 g/dL (ref 30.0–36.0)
MCV: 79.1 fL (ref 78.0–100.0)
Platelets: 323 10*3/uL (ref 150–400)
RBC: 5.16 MIL/uL — ABNORMAL HIGH (ref 3.87–5.11)
RDW: 15 % (ref 11.5–15.5)
WBC: 8.8 10*3/uL (ref 4.0–10.5)

## 2016-10-02 LAB — BASIC METABOLIC PANEL
Anion gap: 10 (ref 5–15)
BUN: 5 mg/dL — ABNORMAL LOW (ref 6–20)
CO2: 22 mmol/L (ref 22–32)
Calcium: 8.8 mg/dL — ABNORMAL LOW (ref 8.9–10.3)
Chloride: 104 mmol/L (ref 101–111)
Creatinine, Ser: 0.42 mg/dL — ABNORMAL LOW (ref 0.44–1.00)
GFR calc Af Amer: >60 mL/min (ref >60)
GLUCOSE: 85 mg/dL (ref 65–99)
Potassium: 3.4 mmol/L — ABNORMAL LOW (ref 3.5–5.1)
Sodium: 136 mmol/L (ref 135–145)

## 2016-10-02 LAB — I-STAT TROPONIN, ED: Troponin i, poc: 0 ng/mL (ref 0.00–0.08)

## 2016-10-02 MED ORDER — AMLODIPINE BESYLATE 2.5 MG PO TABS: 2.5000 mg | ORAL_TABLET | Freq: Every day | ORAL | 0 refills | Status: DC

## 2016-10-02 MED ORDER — CLONIDINE HCL 0.1 MG PO TABS
0.1000 mg | ORAL_TABLET | Freq: Once | ORAL | Status: AC
  Administered 2016-10-02: 0.1 mg via ORAL

## 2016-10-02 MED ORDER — HYDROXYZINE HCL 50 MG PO TABS: 50.0000 mg | ORAL_TABLET | Freq: Three times a day (TID) | ORAL | 0 refills | Status: DC | PRN

## 2016-10-02 NOTE — ED Notes (Signed)
Pt refused blood draw,  Nurse notified. 

## 2016-10-02 NOTE — ED Provider Notes (Signed)
[facility] DEPT Provider Note   CSN: [id number] Arrival date & time: 10/02/16  1713     History   Chief Complaint Chief Complaint  Patient presents with  . Chest Pain    HPI Brooke Marks is a 36 y.o. female.  Patient with past medical history of anxiety, GERD, presents with acute onset of chest pain that began this morning while sitting at work. She states chest pain was associated with shortness of breath, left arm pain and tingling, followed by facial and tongue tingling. States she think this is due to her anxiety but wanted to make sure something serious wasn't occurring. Patient reported to her primary care, she was noted to have high blood pressure was given clonidine in the clinic, and prescribed amlodipine. Patient states she began feeling better today and took a nap, however was startled awake and began having symptoms again. Patient reports chest pain is central and achy/tight, constant, not exertional. She is no longer feeling short of breath, however still has tingling in her face and tongue. no modifying factors. Denies history of DVT, recent travel, recent surgery, history of cancer, leg pain or swelling, cough.      Past Medical History:  Diagnosis Date  . Anxiety   . Chlamydia   . Depression   . Diabetes mellitus without complication ([place])    Gestational  . PIH (pregnancy induced hypertension) 01/27/2016  . S/P cesarean section 01/27/2016    Patient Active Problem List   Diagnosis Date Noted  . PIH (pregnancy induced hypertension) 01/27/2016  . S/P cesarean section 01/27/2016  . Anxiety state 04/13/2015  . PMDD (premenstrual dysphoric disorder) 12/25/2014  . Visit for preventive health examination 12/25/2014  . GERD (gastroesophageal reflux disease) 02/16/2014  . 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 CARPAL TUNNEL SYNDROME, LEFT 04/11/2010  . CHEST PAIN, ATYPICAL 04/11/2010  . HERPES SIMPLEX INFECTION, RECURRENT 02/26/2009  . OBESITY, NOS  07/19/2006    Past Surgical History:  Procedure Laterality Date  . CESAREAN SECTION N/A 01/27/2016   Procedure: CESAREAN SECTION;  Surgeon: Janyth Contes, MD;  Location: [facility];  Service: Obstetrics;  Laterality: N/A;  MD requests RNFA  . WISDOM TOOTH EXTRACTION      OB History    Gravida Para Term Preterm AB Living   3 1 1   2 1    SAB TAB Ectopic Multiple Live Births     2   0 1       Home Medications    Prior to Admission medications   Medication Sig Start Date End Date Taking? Authorizing Provider  amLODipine (NORVASC) 2.5 MG tablet Take 1 tablet (2.5 mg total) by mouth daily. 10/02/16   Katheren Shams, DO  hydrOXYzine (ATARAX/VISTARIL) 50 MG tablet Take 1 tablet (50 mg total) by mouth 3 (three) times daily as needed for anxiety. 10/02/16   Katheren Shams, DO  ibuprofen (ADVIL,MOTRIN) 800 MG tablet Take 1 tablet (800 mg total) by mouth every 8 (eight) hours. 01/29/16   Paula Compton, MD  NIFEdipine (PROCARDIA-XL/ADALAT CC) 30 MG 24 hr tablet Take 1 tablet (30 mg total) by mouth 2 (two) times daily. 01/29/16   Paula Compton, MD  oxyCODONE (OXY IR/ROXICODONE) 5 MG immediate release tablet Take 1 tablet (5 mg total) by mouth every 4 (four) hours as needed (pain scale 4-7). 01/29/16   Paula Compton, MD  Prenatal Vit-Fe Fumarate-FA (PRENATAL MULTIVITAMIN) TABS tablet Take 1 tablet by mouth at bedtime.    [provider]    Family History Family History  Problem Relation Age of Onset  . Diabetes Mother   . Hypertension Father     Social History Social History  Substance Use Topics  . Smoking status: Never Smoker  . Smokeless tobacco: Never Used  . Alcohol use No     Allergies   Patient has no known allergies.   Review of Systems Review of Systems  Respiratory: Positive for chest tightness. Negative for cough.   Cardiovascular: Negative for leg swelling.  Gastrointestinal: Negative for abdominal pain.  Musculoskeletal: Negative for  back pain.  Neurological:       Tingling in face and tongue  Psychiatric/Behavioral: The patient is nervous/anxious.      Physical Exam Updated Vital Signs BP 121/79   Pulse (!) 113   Resp 18   Ht 5\' 3"  (1.6 m)   Wt 80.3 kg   LMP 09/19/2016   SpO2 100%   BMI 31.35 kg/m   Physical Exam  Constitutional: She is oriented to person, place, and time. She appears well-developed and well-nourished. No distress.  Patient resting comfortably in bed.  HENT:  Head: Normocephalic and atraumatic.  Eyes: Conjunctivae and EOM are normal. Pupils are equal, round, and reactive to light.  Cardiovascular: Normal rate, regular rhythm, normal heart sounds and intact distal pulses.  Exam reveals no friction rub.   No murmur heard. Pt intermittently tachycardic  Pulmonary/Chest: Effort normal and breath sounds normal. No respiratory distress. She has no wheezes. She has no rales.  Abdominal: Soft. Bowel sounds are normal. She exhibits no distension. There is no tenderness. There is no guarding.  Neurological: She is alert and oriented to person, place, and time. No cranial nerve deficit or sensory deficit.  5/5 strength b/l upper and lower extremities  Psychiatric: She has a normal mood and affect. Her behavior is normal.  Nursing note and vitals reviewed.    ED Treatments / Results  Labs (all labs ordered are listed, but only abnormal results are displayed) Labs Reviewed  BASIC METABOLIC PANEL - Abnormal; Notable for the following:       Result Value   Potassium 3.4 (*)    BUN 5 (*)    Creatinine, Ser 0.42 (*)    Calcium 8.8 (*)    All other components within normal limits  CBC - Abnormal; Notable for the following:    RBC 5.16 (*)    All other components within normal limits  TSH  T4, FREE  I-STAT TROPOININ, ED    EKG  EKG Interpretation  Date/Time:  Monday Oct 02 2016 17:18:57 EDT Ventricular Rate:  109 PR Interval:    QRS Duration: 102 QT Interval:  367 QTC  Calculation: 495 R Axis:   64 Text Interpretation:  Sinus tachycardia Borderline T abnormalities, anterior leads Borderline prolonged QT interval no change from previous Confirmed by Charlesetta Shanks [id number]) on 10/02/2016 7:20:40 PM       Radiology Dg Chest 2 View  Result Date: 10/02/2016 CLINICAL DATA:  Left-sided chest pain with dyspnea x2 days EXAM: CHEST  2 VIEW COMPARISON:  05/19/2007 FINDINGS: The heart size and mediastinal contours are within normal limits. Both lungs are clear. The visualized skeletal structures are nonacute. Slight straightening of thoracic curvature as before. IMPRESSION: No active cardiopulmonary disease. Electronically Signed   By: Ashley Royalty M.D.   On: 10/02/2016 18:00    Procedures Procedures (including critical care time)  Medications Ordered in ED Medications - No data to display  Initial Impression / Assessment and Plan / ED Course  I have reviewed the triage vital signs and the nursing notes.  Pertinent labs & imaging results that were available during my care of the patient were reviewed by me and considered in my medical decision making (see chart for details).     Patient presents to the emergency department complaining of symptoms consistent with anxiety.  Patient has a history of same with similar episodes. CP is not exertional. The patient is resting comfortably, in no apparent distress.  Labs, ECG, CXR reviewed and reassuring. TSH and T4 pending. Intermittently tachycardic. No exophthalmos, no symptoms of UTI.  Stress reducing mechanisms discussed including caffeine intake.  Pt to follow up with her PCP for f/u on her visit today and to review pending lab results. Pt safe and agreeable for discharge.  Patient discussed with and seen by Dr. Johnney Killian.  Discussed results, findings, treatment and follow up. Patient advised of return precautions. Patient verbalized understanding and agreed with plan.  Final Clinical Impressions(s) / ED Diagnoses    Final diagnoses:  Panic attack  Atypical chest pain    New Prescriptions New Prescriptions   No medications on file     Russo, [place] N, PA-C 10/02/16 2039    Russo, [place] N, PA-C 10/02/16 2103    Charlesetta Shanks, MD 10/07/16 [phone]

## 2016-10-02 NOTE — Discharge Instructions (Signed)
Please read instructions below.  Follow up with your primary care provider about your visit today and your pending lab results. Return to the ER for new or worsening symptoms; including chest pain with exertion, worsening shortness of breath, pain that radiates to the arm or neck.

## 2016-10-02 NOTE — ED Notes (Signed)
Pt stable, ambulatory, states understanding of discharge instructions 

## 2016-10-02 NOTE — ED Notes (Signed)
Pt. returned from XR. 

## 2016-10-02 NOTE — Progress Notes (Signed)
Subjective:   Patient ID: Brooke Marks, female    DOB: 07-02-1980, 36 y.o.   MRN: [id number]  Patient presents for Same Day Appointment  Chief Complaint  Patient presents with  . Headache    HPI: # Elevated BP Patient states that she was at work 2 hours ago when she started having headache, tongue numbness, shortness of breath, and chest discomfort. She at first thought the symptoms were due to a panic attack. However, symptoms have persisted 2 hours. Her panic attacks usually do not persist this long. Patient states that she is having tingling at the tip of her tongue, her head feels heavy, her whole face feels numb, and having left-sided finger and arm tingling, and chest pain. Chest pain localized to left side of her chest with radiation to her back. She has not tried any medications. Nothing seems to improve her symptoms. Being anxious worsens her symptoms. However vitals checked at work and was noted to have a blood pressure 175/112. This scared patient so she presented to clinic.  Of note, patient has h/o PIH and was on BP medications. She is no longer taking those medications. She is 8 months post-partum. Currently nursing.   FH Mom - stroke Father - heart attack   Review of Systems   See HPI for ROS.   History  Smoking Status  . Never Smoker  Smokeless Tobacco  . Never Used    Past medical history, surgical, family, and social history reviewed and updated in the EMR as appropriate.  Pertinent Historical Findings include: Anxiety, panic attacks Objective:  BP (!) 160/100   Pulse 100   Temp 97.7 F (36.5 C) (Oral)   Ht 5\' 3"  (1.6 m)   Wt 177 lb 9.6 oz (80.6 kg)   BMI 31.46 kg/m  Vitals and nursing note reviewed  Physical Exam  Constitutional: She is oriented to person, place, and time and well-developed, well-nourished, and in no distress.  HENT:  Head: Normocephalic and atraumatic.  Eyes: Conjunctivae and EOM are normal.  Neck: Normal range of motion.    Cardiovascular: Regular rhythm, normal heart sounds and intact distal pulses.  Tachycardia present.   Pulmonary/Chest: Effort normal and breath sounds normal.  Neurological: She is alert and oriented to person, place, and time. She has normal motor skills, normal sensation and normal strength. No cranial nerve deficit.  Psychiatric: Her mood appears anxious.    Assessment & Plan:  1. Elevated BP without diagnosis of hypertension Patient with history of hypertension in pregnancy. However no history of hypertension outside of pregnancy. Elevated blood pressures today. Patient not on any blood pressure medications. Initially offered patient to go to ED for evaluation. She declined. Given 1mg  dose of clonidine for BP; on recheck improved. Given short course of BP medication to take until can follow-up with PCP.   2. Chest pain, unspecified type Patient with chest paint that started 2 hours ago. Has associated left-sided tingling, radiation to back, and FH. Heart score of 2. No concern at this time for ACS. EKG reviewed with normal sinus rhythm. However with concerning history would recommend the patient go to the ED for further evaluation and observation; patient declined. Discussed risk and benefits of doing so. Unable to do full work-up of patient due to clinic closing. I believe she needs observation. Believe that all of her symptoms are due to increased anxiety and panic disorder. Rx given for Hydroxyzine for short trial for anxiety. However want to rule out more concerning  causes. Patient given warning signs and return precautions. To seek care in the ED if symptoms persist or worsens.  - EKG 12-Lead  Precepted case with Dr. Gwendlyn Deutscher  The patient verbalized understanding and agreed with the plan.    Luiz Blare, DO 10/02/2016, 11:51 AM PGY-3, [place]

## 2016-10-02 NOTE — ED Notes (Signed)
Dr Johnney Killian notified pt refused labs

## 2016-10-02 NOTE — ED Triage Notes (Signed)
Pt from home with chest pain after being startled awake. Pt has tightness across chest and high blood pressure of 198/136, pt blood pressure is now doen to 143/107 and pain has began to go down

## 2016-10-02 NOTE — Patient Instructions (Addendum)
You declined going to ED for further evaluation  BP medication given in clinic Also sent in short course of BP medication to take. Need to follow-up with PCP for BP management  Anxiety medicine also prescribed   Please go to ED if symtpoms persist. I was not able to rule out any life threatening causes.

## 2016-10-03 ENCOUNTER — Telehealth: Payer: Self-pay | Admitting: Student

## 2016-10-03 NOTE — Telephone Encounter (Signed)
Return call to patient regarding blood pressure medication.  Patient stated she was taking Nifedipine.  However, patient mentioned yesterday that she was not on any medications.  Advised patient to only take the medications that was prescribed at her office visit yesterday and to keep her appointment.  Derl Barrow, RN

## 2016-10-03 NOTE — Telephone Encounter (Signed)
Pt states she was just put on on amlodipine and wants to know if she should be taking her other BP medication as well. ep

## 2016-10-13 ENCOUNTER — Ambulatory Visit (INDEPENDENT_AMBULATORY_CARE_PROVIDER_SITE_OTHER): Payer: Commercial Managed Care - HMO | Admitting: Internal Medicine

## 2016-10-13 ENCOUNTER — Encounter: Payer: Self-pay | Admitting: Internal Medicine

## 2016-10-13 ENCOUNTER — Ambulatory Visit: Payer: Commercial Managed Care - HMO | Admitting: Student in an Organized Health Care Education/Training Program

## 2016-10-13 DIAGNOSIS — R030 Elevated blood-pressure reading, without diagnosis of hypertension: Secondary | ICD-10-CM | POA: Diagnosis not present

## 2016-10-13 DIAGNOSIS — F411 Generalized anxiety disorder: Secondary | ICD-10-CM

## 2016-10-13 MED ORDER — NIFEDIPINE ER 60 MG PO TB24: 60.0000 mg | ORAL_TABLET | Freq: Every day | ORAL | 0 refills | Status: DC

## 2016-10-13 NOTE — Progress Notes (Signed)
   [facility] Phone: [phone]   Date of Visit: 10/13/2016   HPI:  Anxiety:  - factors that cause anxiety: her blood pressure, her mother that she is taking care of, full time job, 61 month old child.  - she does not want to take any medications for symptoms - she is not exercising regularly but is planning on exercising in hopes this will help her manage her stress.  - no SI/HI - GAD 7 score: 3,3,3,3,0,3,3 = 18, extremely difficult Elevated BP: - per chart review, had pregnancy induced hypertension - office visit in 5/14 noted elevated BP and was given Norvac 2.5mg  daily  - patient reports that this medication did not work and she continued to have elevated blood pressures. Therefore, she stopped this medication after taking it for two days and restarted Nifedipine which was the medication she took during her pregnancy; restarted this past weekend.  - the nifedipine medication is extended release but is prescribed as 30mg  BID  - reports that her blood pressure has been well controlled (in the "normal range") since starting this medication. Denies HA, ,blurred vision, dizziness, lightheadedness.    ROS: See HPI.  [place]:  Elevated BP  Anxiety  PHYSICAL EXAM: BP 110/78   Pulse 66   Temp 97.8 F (36.6 C) (Oral)   Wt 177 lb (80.3 kg)   LMP 09/19/2016   BMI 31.35 kg/m  GEN: NAD HEENT: Atraumatic, normocephalic, neck supple, EOMI, sclera clear  CV: RRR, no murmurs, rubs, or gallops PULM: CTAB, normal effort ABD: Soft, nontender, nondistended, NABS, no organomegaly SKIN: No rash or cyanosis; warm and well-perfused EXTR: No lower extremity edema or calf tenderness PSYCH: Mood and affect euthymic, normal rate and volume of speech NEURO: Awake, alert, no focal deficits grossly, normal speech   ASSESSMENT/PLAN:  Anxiety state GAD7 18. Patient is not interested in medication. Gave contact information for behavioral health which she is interested in. No  SI/HI. Encouraged regular physical activity and time for self care. Follow up 4-6 weeks or sooner  Elevated BP without diagnosis of hypertension There are two BP readings that are elevated per chart review (with many normal BP). Her anxiety is likely contributing to her elevated BP. Today, blood pressure is normal. Discussed possibly decreasing dose of Nifedipine (as she is not interested in Norvasc). Patient currently not interested in decreasing dose. Discussed monitoring for signs and symptoms of hypotension. Follow up in 4-6 weeks.    Smiley Houseman, MD PGY [place]

## 2016-10-13 NOTE — Patient Instructions (Addendum)
Definitely try to exercise and take time for yourself.  Follow up in 4-6 weeks.

## 2016-10-16 DIAGNOSIS — R030 Elevated blood-pressure reading, without diagnosis of hypertension: Secondary | ICD-10-CM | POA: Insufficient documentation

## 2016-10-16 NOTE — Assessment & Plan Note (Signed)
There are two BP readings that are elevated per chart review (with many normal BP). Her anxiety is likely contributing to her elevated BP. Today, blood pressure is normal. Discussed possibly decreasing dose of Nifedipine (as she is not interested in Norvasc). Patient currently not interested in decreasing dose. Discussed monitoring for signs and symptoms of hypotension. Follow up in 4-6 weeks.

## 2016-10-16 NOTE — Assessment & Plan Note (Addendum)
GAD7 18. Patient is not interested in medication. Gave contact information for behavioral health which she is interested in. No SI/HI. Encouraged regular physical activity and time for self care. Follow up 4-6 weeks or sooner

## 2016-11-13 ENCOUNTER — Other Ambulatory Visit: Payer: Self-pay | Admitting: Internal Medicine

## 2016-12-11 ENCOUNTER — Encounter: Payer: Self-pay | Admitting: Student

## 2016-12-11 ENCOUNTER — Other Ambulatory Visit: Payer: Self-pay | Admitting: Student

## 2016-12-12 ENCOUNTER — Telehealth: Payer: Self-pay | Admitting: Student

## 2016-12-12 NOTE — Telephone Encounter (Signed)
Pt is returning the doctor's call and message in [facility] Chart. She would like to speak to the doctor concerning her BP medication. jw

## 2016-12-13 NOTE — Telephone Encounter (Signed)
Talked to patient response to her question about her blood pressure and her medication. Patient has pregnancy-induced hypertension that has not resolved after she had a baby. She reports checking her blood pressure at home. She states that her systolic blood pressure usually runs in upper 176H and diastolic in 60V and 37T. She thinks part of this is related to stress taking care of her mother who had his second surgery (leg amputation).  Patient will schedule a follow-up. We will continue her nifedipine for now.

## 2017-01-11 ENCOUNTER — Other Ambulatory Visit: Payer: Self-pay | Admitting: Student

## 2017-01-15 ENCOUNTER — Telehealth: Payer: Self-pay | Admitting: *Deleted

## 2017-01-15 NOTE — Telephone Encounter (Signed)
Patient called in requesting refill on nifedipine. Informed that refill was sent to [facility] on [address]  On 8/23. Patient states she no longer uses that pharmacy and requesting it [facility] to [facility] on [address]. Called in to "Shiny" at [place] removed from patient's record. Hubbard Hartshorn, RN, BSN

## 2017-02-09 ENCOUNTER — Other Ambulatory Visit: Payer: Self-pay | Admitting: Student

## 2017-03-20 ENCOUNTER — Other Ambulatory Visit: Payer: Self-pay | Admitting: Student

## 2017-05-01 ENCOUNTER — Other Ambulatory Visit: Payer: Self-pay | Admitting: Student

## 2017-06-11 ENCOUNTER — Other Ambulatory Visit: Payer: Self-pay | Admitting: Student

## 2017-06-11 DIAGNOSIS — O139 Gestational [pregnancy-induced] hypertension without significant proteinuria, unspecified trimester: Secondary | ICD-10-CM

## 2017-07-21 ENCOUNTER — Other Ambulatory Visit: Payer: Self-pay | Admitting: Student

## 2017-07-21 DIAGNOSIS — O139 Gestational [pregnancy-induced] hypertension without significant proteinuria, unspecified trimester: Secondary | ICD-10-CM

## 2017-07-23 ENCOUNTER — Other Ambulatory Visit: Payer: Self-pay | Admitting: Student

## 2017-07-23 DIAGNOSIS — O139 Gestational [pregnancy-induced] hypertension without significant proteinuria, unspecified trimester: Secondary | ICD-10-CM

## 2017-07-25 ENCOUNTER — Other Ambulatory Visit: Payer: Self-pay

## 2017-07-25 DIAGNOSIS — O139 Gestational [pregnancy-induced] hypertension without significant proteinuria, unspecified trimester: Secondary | ICD-10-CM

## 2017-07-25 MED ORDER — NIFEDIPINE ER OSMOTIC RELEASE 60 MG PO TB24: 60.0000 mg | ORAL_TABLET | Freq: Every day | ORAL | 0 refills | Status: DC

## 2017-07-25 NOTE — Telephone Encounter (Signed)
Pt called nurse line regarding her Rx for nifedipine being denied. Informed pt she needs to be seen. Has appointment on 08/06/17. Has been out of her meds for a week. Wanting to know if she can get another refill or if she needs to wait until her appointment. [facility] [place] [facility] be reached at [place], RN

## 2017-07-25 NOTE — Telephone Encounter (Signed)
Pt informed medication refilled. Wallace Cullens, RN

## 2017-08-04 ENCOUNTER — Encounter (HOSPITAL_COMMUNITY): Payer: Self-pay

## 2017-08-04 ENCOUNTER — Ambulatory Visit (HOSPITAL_COMMUNITY)
Admission: EM | Admit: 2017-08-04 | Discharge: 2017-08-04 | Disposition: A | Discharge status: Home / Self Care | Payer: 59 | Attending: Family Medicine | Admitting: Family Medicine

## 2017-08-04 ENCOUNTER — Other Ambulatory Visit: Payer: Self-pay

## 2017-08-04 DIAGNOSIS — R109 Unspecified abdominal pain: Secondary | ICD-10-CM | POA: Diagnosis not present

## 2017-08-04 DIAGNOSIS — K219 Gastro-esophageal reflux disease without esophagitis: Secondary | ICD-10-CM

## 2017-08-04 DIAGNOSIS — K5900 Constipation, unspecified: Secondary | ICD-10-CM

## 2017-08-04 LAB — POCT URINALYSIS DIP (DEVICE)
BILIRUBIN URINE: NEGATIVE
Glucose, UA: NEGATIVE mg/dL
Hgb urine dipstick: NEGATIVE
Ketones, ur: NEGATIVE mg/dL
Leukocytes, UA: NEGATIVE
NITRITE: NEGATIVE
Protein, ur: 30 mg/dL — AB
SPECIFIC GRAVITY, URINE: 1.015 (ref 1.005–1.030)
UROBILINOGEN UA: 0.2 mg/dL (ref 0.0–1.0)
pH: 8.5 — ABNORMAL HIGH (ref 5.0–8.0)

## 2017-08-04 LAB — POCT PREGNANCY, URINE: PREG TEST UR: NEGATIVE

## 2017-08-04 MED ORDER — OMEPRAZOLE 20 MG PO CPDR: 20.0000 mg | DELAYED_RELEASE_CAPSULE | Freq: Every day | ORAL | 0 refills | Status: DC

## 2017-08-04 NOTE — ED Triage Notes (Signed)
Pt presents today with abdominal discomfort as well as left sided stabbing pain that has been going on since Wednesday. States she has not had BMs like she is supposed to but did have one this morning that did hurt. States she has had a lot of gas and abdominal bloating as well.

## 2017-08-04 NOTE — Discharge Instructions (Signed)
For constipation you may try either Miralax or a stool softener such as Colace.

## 2017-08-06 ENCOUNTER — Encounter: Payer: Self-pay | Admitting: Student

## 2017-08-06 ENCOUNTER — Other Ambulatory Visit: Payer: Self-pay

## 2017-08-06 ENCOUNTER — Ambulatory Visit (INDEPENDENT_AMBULATORY_CARE_PROVIDER_SITE_OTHER): Payer: 59 | Admitting: Student

## 2017-08-06 VITALS — BP 116/72 | Temp 98.6°F | Ht 63.0 in | Wt 183.2 lb

## 2017-08-06 DIAGNOSIS — Z0001 Encounter for general adult medical examination with abnormal findings: Secondary | ICD-10-CM | POA: Diagnosis not present

## 2017-08-06 DIAGNOSIS — R030 Elevated blood-pressure reading, without diagnosis of hypertension: Secondary | ICD-10-CM

## 2017-08-06 DIAGNOSIS — R109 Unspecified abdominal pain: Secondary | ICD-10-CM | POA: Diagnosis not present

## 2017-08-06 DIAGNOSIS — Z0000 Encounter for general adult medical examination without abnormal findings: Secondary | ICD-10-CM

## 2017-08-06 MED ORDER — AMLODIPINE BESYLATE 5 MG PO TABS: 5.0000 mg | ORAL_TABLET | Freq: Every day | ORAL | 3 refills | Status: DC

## 2017-08-06 NOTE — Progress Notes (Signed)
Subjective:   Chief Complaint  Patient presents with  . Annual Exam   HPI Brooke Marks is a 37 y.o. old female here  for annual exam.  Concern today: Sharp pain over left flank: went to UC two days ago and was told her urine didn't show infection. Denies fever, dysuria, hematuria, increased frequency.  Denies GI symptoms.  Denies vaginal discharge or bleeding.  Denies overlying skin rash.  Denies cough or dyspnea.  Pain has improved with ibuprofen.  Changes in his/her health in the last 12 months: no Wears seatbelt: yes.    The patient has regular exercise: no.   Enough vegetables and fruits: yes.  Smokes cigarette: no Drinks EtOH: no Drug use: no The patient is sexually active.  Patient uses birth control: no.  Domestic violence: no.   History of depression: she attributes her mood issues to her mother Patient dental home: yes  Screening Need colon cancer screening: no. Need breast cancer ccreening: no. Need cervical cancer Screening: no. STOP BANG >/=3 for OSA: no. Need HCV Screening: no. Need STI Screening: no. Fall in the last 12 months:no  PMH/Problem List: has HERPES SIMPLEX INFECTION, RECURRENT; OBESITY, NOS; Panic attack with specific phobia; CARPAL TUNNEL SYNDROME, LEFT; CHEST PAIN, ATYPICAL; GERD (gastroesophageal reflux disease); PMDD (premenstrual dysphoric disorder); Annual physical exam; Anxiety state; PIH (pregnancy induced hypertension); S/P cesarean section; and Elevated BP without diagnosis of hypertension on their problem list.   has a past medical history of Anxiety, Chlamydia, Depression, Diabetes mellitus without complication ([place]), PIH (pregnancy induced hypertension) (01/27/2016), and S/P cesarean section (01/27/2016).  [facility]  Family History  Problem Relation Age of Onset  . Diabetes Mother   . Hypertension Father    Family history of heart disease before age of 51 yrs: no. Family history of stroke: no. Family history of cancer: no.  SH Social History     Tobacco Use  . Smoking status: Never Smoker  . Smokeless tobacco: Never Used  Substance Use Topics  . Alcohol use: No  . Drug use: No   Review of Systems  Constitutional: Negative for appetite change, diaphoresis, fatigue, fever and unexpected weight change.  HENT: Negative for dental problem and trouble swallowing.   Eyes: Negative for visual disturbance.  Respiratory: Negative for cough, chest tightness and shortness of breath.   Cardiovascular: Negative for chest pain, palpitations and leg swelling.  Gastrointestinal: Negative for abdominal pain and blood in stool.  Endocrine: Negative for cold intolerance, heat intolerance, polydipsia, polyphagia and polyuria.  Genitourinary: Negative for dysuria, hematuria and menstrual problem.  Musculoskeletal: Negative for arthralgias and myalgias.  Skin: Negative for rash.  Neurological: Negative for dizziness and light-headedness.  Hematological: Negative for adenopathy. Does not bruise/bleed easily.  Psychiatric/Behavioral: Negative for dysphoric mood.       Objective:   Physical Exam Vitals:   08/06/17 1536  BP: 116/72  Temp: 98.6 F (37 C)  TempSrc: Oral  Weight: 183 lb 3.2 oz (83.1 kg)  Height: '5\' 3"'  (1.6 m)   Body mass index is 32.45 kg/m.  GEN: appears well, no apparent distress. Head: normocephalic and atraumatic  Eyes: conjunctiva without injection, sclera anicteric Ears: external ear and ear canal normal Nares: no rhinorrhea, congestion or erythema Oropharynx: mmm without erythema or exudation HEM: negative for cervical or periauricular lymphadenopathies CVS: RRR, nl s1 & s2, no murmurs, no edema RESP: no IWOB, good air movement bilaterally, CTAB GI: BS present & normal, soft, NTND, no guarding, no rebound, no mass GU: no suprapubic  or CVA tenderness MSK: some point  tenderness to palpation over left flank SKIN: no apparent skin lesion ENDO: negative thyromegall NEURO: alert and oiented appropriately, no gross  deficits  PSYCH: euthymic mood with congruent affect    Assessment & Plan:  1. Annual physical exam: history, medication and allergies updated.  Up-to-date on immunization and Pap smear.  Discussed about lifestyle change in diet and exercise.  She reported struggling with this.  She is vegetarian.  She is interested in our nutrition clinic. Gave her phone number to call and schedule  2. Elevated BP without diagnosis of hypertension: history of pregnancy-induced hypertension.  She has been taking nifedipine for over 18 months.  She says her blood pressure was high in 150's when she stopped nifedipine.  Will switch to amlodipine 5 mg daily.  Recommended checking her blood pressure once a day or every other day.  Follow-up in 1 months.  Patient to bring in blood pressure log. - amLODipine (NORVASC) 5 MG tablet; Take 1 tablet (5 mg total) by mouth daily.  Dispense: 90 tablet; Refill: 3 - CMP14+EGFR - TSH  3. Acute left flank pain: this is likely musculoskeletal.  She reports repeatedly lifting daughter from unnatural position.  She denies infectious, respiratory, GU or GI symptoms.  Overall, pain has improved tremendously.   Follow-up in 1 month for blood pressure.  Wendee Beavers PGY-3 Pager [phone] 08/06/17  7:39 PM

## 2017-08-06 NOTE — Patient Instructions (Signed)
It was great seeing you today! We have addressed the following issues today  Flank pain: You may have pulled a muscle.  I do not think this is coming from your kidney.  Definitely not pneumonia  Hypertension/blood pressure: we change her nifedipine to amlodipine.  Check your blood pressure as we discussed in the office.  Your goal blood pressure is less than 130/80.  Lifestyle change including diet and exercise are equally important.  Please follow-up with our nutritionist for more on this.   Portion Size   Choose healthier foods such as 100% whole grains, vegetables, fruits, beans, nut seeds, olive oil, most vegetable oils, fat-free dietary, wild game and fish.   Avoid sweet tea, other sweetened beverages, soda, fruit juice, cold cereal and milk and trans fat.   Eat at least 3 meals and 1-2 snacks per day.  Aim for no more than 5 hours between eating.  Eat breakfast within one hour of getting up.    Exercise at least 150 minutes per week, including weight resistance exercises 3 or 4 times per week.   Try to lose at least 7-10% of your current body weight.   Limit your salt (Sodium) intake to less than 2 gm (2000 mg) a day if you have conditions such as  elevated blood pressure, heart failure...    You may also read about DASH and/or Mediterranean diet at the following web site if you have blood pressure or heart condition. IdentityList.se LACodes.co.za

## 2017-08-07 DIAGNOSIS — L709 Acne, unspecified: Secondary | ICD-10-CM | POA: Diagnosis not present

## 2017-08-07 DIAGNOSIS — L659 Nonscarring hair loss, unspecified: Secondary | ICD-10-CM | POA: Diagnosis not present

## 2017-08-07 LAB — CMP14+EGFR
ALBUMIN: 4.3 g/dL (ref 3.5–5.5)
ALT: 15 IU/L (ref 0–32)
AST: 16 IU/L (ref 0–40)
Albumin/Globulin Ratio: 1.3 (ref 1.2–2.2)
Alkaline Phosphatase: 77 IU/L (ref 39–117)
BUN/Creatinine Ratio: 13 (ref 9–23)
BUN: 8 mg/dL (ref 6–20)
Bilirubin Total: 0.2 mg/dL (ref 0.0–1.2)
CO2: 22 mmol/L (ref 20–29)
CREATININE: 0.61 mg/dL (ref 0.57–1.00)
Calcium: 9 mg/dL (ref 8.7–10.2)
Chloride: 102 mmol/L (ref 96–106)
GFR calc non Af Amer: 119 mL/min/{1.73_m2} (ref >59)
GFR, EST AFRICAN AMERICAN: 137 mL/min/{1.73_m2} (ref >59)
GLUCOSE: 95 mg/dL (ref 65–99)
Globulin, Total: 3.3 g/dL (ref 1.5–4.5)
Potassium: 4.4 mmol/L (ref 3.5–5.2)
Sodium: 140 mmol/L (ref 134–144)
TOTAL PROTEIN: 7.6 g/dL (ref 6.0–8.5)

## 2017-08-07 LAB — TSH: TSH: 0.967 u[IU]/mL (ref 0.450–4.500)

## 2017-08-09 NOTE — ED Provider Notes (Signed)
[facility]   [id number] 08/04/17 Arrival Time: [place]:  1. Side pain   2. Constipation, unspecified constipation type   3. Gastroesophageal reflux disease without esophagitis     Meds ordered this encounter  Medications  . omeprazole (PRILOSEC) 20 MG capsule    Sig: Take 1 capsule (20 mg total) by mouth daily.    Dispense:  30 capsule    Refill:  0   Observation. Will f/u with PCP or here if needed. Reviewed expectations re: course of current medical issues. Questions answered. Outlined signs and symptoms indicating need for more acute intervention. Patient verbalized understanding. After Visit Summary given.   SUBJECTIVE:  Brooke Marks is a 37 y.o. female who presents with complaint of intermittent abdominal discomfort. Onset a few, days ago. Location: L-sided without radiation. Described as stabbing. Symptoms are stable since beginning. Aggravating factors: none. Alleviating factors: none. Associated symptoms: "bloated feeling". She denies fever, nausea and vomiting. Appetite: normal. PO intake: normal. Ambulatory without assistance. Urinary symptoms: none. Last bowel movement 3-4 days ago without blood. Questions if symptoms are related to constipation OTC treatment: none. Also h/o GERD. Mild symptoms/belching.  Patient's last menstrual period was 07/19/2017 (exact date).   Past Surgical History:  Procedure Laterality Date  . CESAREAN SECTION N/A 01/27/2016   Procedure: CESAREAN SECTION;  Surgeon: Janyth Contes, MD;  Location: [facility];  Service: Obstetrics;  Laterality: N/A;  MD requests RNFA  . WISDOM TOOTH EXTRACTION      ROS: As per HPI.  OBJECTIVE:  Vitals:   08/04/17 1213  BP: 133/90  Resp: 16  Temp: 97.6 F (36.4 C)  TempSrc: Oral    General appearance: alert; no distress Lungs: clear to auscultation bilaterally Heart: regular rate and rhythm Abdomen: soft; non-distended; mild tenderness that is poorly  localized; bowel sounds present; no masses or organomegaly; no guarding or rebound tenderness Back: no CVA tenderness; FROM at hips Extremities: no edema; symmetrical with no gross deformities Skin: warm and dry Neurologic: normal gait Psychological: alert and cooperative; normal mood and affect  Labs: Results for orders placed or performed during the hospital encounter of 08/04/17  POCT urinalysis dip (device)  Result Value Ref Range   Glucose, UA NEGATIVE NEGATIVE mg/dL   Bilirubin Urine NEGATIVE NEGATIVE   Ketones, ur NEGATIVE NEGATIVE mg/dL   Specific Gravity, Urine 1.015 1.005 - 1.030   Hgb urine dipstick NEGATIVE NEGATIVE   pH 8.5 (H) 5.0 - 8.0   Protein, ur 30 (A) NEGATIVE mg/dL   Urobilinogen, UA 0.2 0.0 - 1.0 mg/dL   Nitrite NEGATIVE NEGATIVE   Leukocytes, UA NEGATIVE NEGATIVE  Pregnancy, urine POC  Result Value Ref Range   Preg Test, Ur NEGATIVE NEGATIVE   Labs Reviewed  POCT URINALYSIS DIP (DEVICE) - Abnormal; Notable for the following components:      Result Value   pH 8.5 (*)    Protein, ur 30 (*)    All other components within normal limits  POCT PREGNANCY, URINE    No Known Allergies                                             Past Medical History:  Diagnosis Date  . Anxiety   . Chlamydia   . Depression   . Diabetes mellitus without complication ([place])    Gestational  . PIH (pregnancy  induced hypertension) 01/27/2016  . S/P cesarean section 01/27/2016   Social History   Socioeconomic History  . Marital status: Single    Spouse name: Not on file  . Number of children: Not on file  . Years of education: Not on file  . Highest education level: Not on file  Occupational History  . Not on file  Social Needs  . Financial resource strain: Not on file  . Food insecurity:    Worry: Not on file    Inability: Not on file  . Transportation needs:    Medical: Not on file    Non-medical: Not on file  Tobacco Use  . Smoking status: Never Smoker  .  Smokeless tobacco: Never Used  Substance and Sexual Activity  . Alcohol use: No  . Drug use: No  . Sexual activity: Yes    Birth control/protection: None  Lifestyle  . Physical activity:    Days per week: Not on file    Minutes per session: Not on file  . Stress: Not on file  Relationships  . Social connections:    Talks on phone: Not on file    Gets together: Not on file    Attends religious service: Not on file    Active member of club or organization: Not on file    Attends meetings of clubs or organizations: Not on file    Relationship status: Not on file  . Intimate partner violence:    Fear of current or ex partner: Not on file    Emotionally abused: Not on file    Physically abused: Not on file    Forced sexual activity: Not on file  Other Topics Concern  . Not on file  Social History Narrative  . Not on file   Family History  Problem Relation Age of Onset  . Diabetes Mother   . Hypertension Father      Vanessa Kick, MD 08/09/17 [phone]

## 2017-09-10 ENCOUNTER — Other Ambulatory Visit: Payer: Self-pay | Admitting: Student

## 2017-09-10 DIAGNOSIS — O139 Gestational [pregnancy-induced] hypertension without significant proteinuria, unspecified trimester: Secondary | ICD-10-CM

## 2017-10-11 ENCOUNTER — Telehealth: Payer: Self-pay

## 2017-10-11 NOTE — Telephone Encounter (Signed)
Pt called nurse line stating she called yesterday asking to speak to Dr. Cyndia Skeeters and that it is VERY IMPORTANT he call her back asap. Call back number [phone] Wallace Cullens, RN

## 2017-10-11 NOTE — Telephone Encounter (Signed)
Called and talked to patient. She says she called the clinic about her mother Scarlett Presto). She says her mother fell off her wheel chair and went to ED. She was told that her mother had whip lash and nerve damage as a result of this fall. She says her mother is in pain. She asks if her mother can be referred to chiropractor. She says she called the clinic yesterday, and was told that she has to wait until 5/31 to see me. She says, "I was told that my mother cannot see another provider". I told her that shouldn't be the case. Her mother should be able to see someone else if I am not available. I advised her to call the clinic make an appointment. She says "I have a lot going on with my son, mother..." & hang up the phone. Off note, I reviewed her ED note and CT head and neck and didn't see any mention of whip lash or nerve damage or injury.

## 2018-03-25 ENCOUNTER — Other Ambulatory Visit: Payer: Self-pay

## 2018-03-25 ENCOUNTER — Ambulatory Visit (INDEPENDENT_AMBULATORY_CARE_PROVIDER_SITE_OTHER)
Admission: EM | Admit: 2018-03-25 | Discharge: 2018-03-25 | Disposition: A | Discharge status: Home / Self Care | Payer: 59 | Source: Home / Self Care

## 2018-03-25 ENCOUNTER — Emergency Department (HOSPITAL_COMMUNITY): Payer: 59

## 2018-03-25 ENCOUNTER — Emergency Department (HOSPITAL_COMMUNITY)
Admission: EM | Admit: 2018-03-25 | Discharge: 2018-03-25 | Discharge status: Against Medical Advice | Payer: 59 | Attending: Emergency Medicine | Admitting: Emergency Medicine

## 2018-03-25 ENCOUNTER — Encounter (HOSPITAL_COMMUNITY): Payer: Self-pay

## 2018-03-25 ENCOUNTER — Encounter (HOSPITAL_COMMUNITY): Payer: Self-pay | Admitting: *Deleted

## 2018-03-25 DIAGNOSIS — E119 Type 2 diabetes mellitus without complications: Secondary | ICD-10-CM | POA: Diagnosis not present

## 2018-03-25 DIAGNOSIS — R0789 Other chest pain: Secondary | ICD-10-CM

## 2018-03-25 DIAGNOSIS — R079 Chest pain, unspecified: Secondary | ICD-10-CM | POA: Diagnosis not present

## 2018-03-25 DIAGNOSIS — R51 Headache: Secondary | ICD-10-CM | POA: Diagnosis not present

## 2018-03-25 DIAGNOSIS — Z79899 Other long term (current) drug therapy: Secondary | ICD-10-CM | POA: Diagnosis not present

## 2018-03-25 DIAGNOSIS — I10 Essential (primary) hypertension: Secondary | ICD-10-CM | POA: Diagnosis not present

## 2018-03-25 LAB — CBC
HCT: 44.4 % (ref 36.0–46.0)
Hemoglobin: 13.7 g/dL (ref 12.0–15.0)
MCH: 24.9 pg — ABNORMAL LOW (ref 26.0–34.0)
MCHC: 30.9 g/dL (ref 30.0–36.0)
MCV: 80.6 fL (ref 80.0–100.0)
PLATELETS: 354 10*3/uL (ref 150–400)
RBC: 5.51 MIL/uL — ABNORMAL HIGH (ref 3.87–5.11)
RDW: 15.4 % (ref 11.5–15.5)
WBC: 6.3 10*3/uL (ref 4.0–10.5)
nRBC: 0 % (ref 0.0–0.2)

## 2018-03-25 LAB — BASIC METABOLIC PANEL
ANION GAP: 5 (ref 5–15)
BUN: 6 mg/dL (ref 6–20)
CALCIUM: 8.9 mg/dL (ref 8.9–10.3)
CO2: 27 mmol/L (ref 22–32)
CREATININE: 0.62 mg/dL (ref 0.44–1.00)
Chloride: 106 mmol/L (ref 98–111)
GFR calc non Af Amer: >60 mL/min (ref >60)
Glucose, Bld: 99 mg/dL (ref 70–99)
Potassium: 3.2 mmol/L — ABNORMAL LOW (ref 3.5–5.1)
Sodium: 138 mmol/L (ref 135–145)

## 2018-03-25 LAB — I-STAT BETA HCG BLOOD, ED (MC, WL, AP ONLY): I-stat hCG, quantitative: <5 m[IU]/mL (ref <5)

## 2018-03-25 LAB — I-STAT TROPONIN, ED
Troponin i, poc: 0 ng/mL (ref 0.00–0.08)
Troponin i, poc: 0.01 ng/mL (ref 0.00–0.08)

## 2018-03-25 LAB — D-DIMER, QUANTITATIVE (NOT AT ARMC): D DIMER QUANT: 0.56 ug{FEU}/mL — AB (ref 0.00–0.50)

## 2018-03-25 MED ORDER — POTASSIUM CHLORIDE CRYS ER 20 MEQ PO TBCR
40.0000 meq | EXTENDED_RELEASE_TABLET | Freq: Once | ORAL | Status: AC
  Administered 2018-03-25: 40 meq via ORAL
  Filled 2018-03-25: qty 2

## 2018-03-25 NOTE — ED Triage Notes (Signed)
Pt presents with complaints of having elevated blood pressures at home 152/95. Reports pumping at home last night and having a pain in her left side of rib. She then began to get anxious, reports having anxiety.  She said when she woke up this morning she started feeling better. She felt her self start to get anxious at work and her blood pressure was high again. Reports having a headache in triage. No apparent distress, no neurological deficits in triage.

## 2018-03-25 NOTE — ED Notes (Signed)
Pt states that she needs to leave after the rest of her blood is drawn. States that she has to pick up her daughter and she will follow up with her PCP. Dr Lita Mains made aware

## 2018-03-25 NOTE — ED Notes (Signed)
Per Dr. Meda Coffee patient should go to the ER for further evaluation. Patient offered to be wheeled down by [facility] staff. Patient states she will drive. Educated patient on [place] parking availability and to not park and walk. Pt verbalized understanding.

## 2018-03-25 NOTE — ED Notes (Signed)
Pt now endorses chest pain. ekg completed.

## 2018-03-25 NOTE — ED Provider Notes (Signed)
[facility]PART[facility] Provider Note   CSN: [id number] Arrival date & time: 03/25/18  1009     History   Chief Complaint Chief Complaint  Patient presents with  . Chest Pain    HPI Brooke Marks is a 37 y.o. female.  HPI Patient states that she began having left-sided chest pain radiating to her left jaw with a "pounding" headache.  Associated with dyspnea and anxiety.  No cough, fever or chills.  Patient states that her symptoms have now completely resolved.  No recent extended travel or immobilization.  No known coronary artery disease.  Father did have an MI in his mid 33s. Past Medical History:  Diagnosis Date  . Anxiety   . Chlamydia   . Depression   . Diabetes mellitus without complication ([place])    Gestational  . PIH (pregnancy induced hypertension) 01/27/2016  . S/P cesarean section 01/27/2016    Patient Active Problem List   Diagnosis Date Noted  . PIH (pregnancy induced hypertension) 01/27/2016  . S/P cesarean section 01/27/2016  . Anxiety state 04/13/2015  . PMDD (premenstrual dysphoric disorder) 12/25/2014  . Annual physical exam 12/25/2014  . GERD (gastroesophageal reflux disease) 02/16/2014  . 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 CARPAL TUNNEL SYNDROME, LEFT 04/11/2010  . CHEST PAIN, ATYPICAL 04/11/2010  . HERPES SIMPLEX INFECTION, RECURRENT 02/26/2009  . OBESITY, NOS 07/19/2006    Past Surgical History:  Procedure Laterality Date  . CESAREAN SECTION N/A 01/27/2016   Procedure: CESAREAN SECTION;  Surgeon: Janyth Contes, MD;  Location: [place] [facility];  Service: Obstetrics;  Laterality: N/A;  MD requests RNFA  . WISDOM TOOTH EXTRACTION       OB History    Gravida  3   Para  1   Term  1   Preterm      AB  2   Living  1     SAB      TAB  2   Ectopic      Multiple  0   Live Births  1            Home Medications    Prior to Admission medications   Medication Sig Start Date End  Date Taking? Authorizing Provider  amLODipine (NORVASC) 10 MG tablet Take 1 tablet (10 mg total) by mouth daily. 03/27/18   Bufford Lope, DO  omeprazole (PRILOSEC) 20 MG capsule Take 1 capsule (20 mg total) by mouth daily. 03/27/18   Bufford Lope, DO  Prenatal Vit-Fe Fumarate-FA (PRENATAL MULTIVITAMIN) TABS tablet Take 1 tablet by mouth at bedtime.    [provider]    Family History Family History  Problem Relation Age of Onset  . Diabetes Mother   . Hypertension Father     Social History Social History   Tobacco Use  . Smoking status: Never Smoker  . Smokeless tobacco: Never Used  Substance Use Topics  . Alcohol use: No  . Drug use: No     Allergies   Patient has no known allergies.   Review of Systems Review of Systems  Constitutional: Negative for chills and fever.  HENT: Positive for trouble swallowing. Negative for sore throat.   Eyes: Negative for visual disturbance.  Respiratory: Positive for shortness of breath. Negative for cough.   Cardiovascular: Positive for chest pain. Negative for palpitations and leg swelling.  Gastrointestinal: Negative for abdominal pain, constipation, diarrhea, nausea and vomiting.  Genitourinary: Negative for dysuria, flank pain and frequency.  Musculoskeletal: Negative for back pain, myalgias and neck pain.  Skin: Negative for rash and wound.  Neurological: Positive for headaches. Negative for dizziness, weakness, light-headedness and numbness.  Psychiatric/Behavioral: The patient is nervous/anxious.   All other systems reviewed and are negative.    Physical Exam Updated Vital Signs BP 123/85   Pulse 88   Temp 98.7 F (37.1 C)   Resp 16   LMP 03/18/2018   SpO2 99%   Physical Exam  Constitutional: She is oriented to person, place, and time. She appears well-developed and well-nourished. No distress.  HENT:  Head: Normocephalic and atraumatic.  Mouth/Throat: Oropharynx is clear and moist. No oropharyngeal exudate.   Eyes: Pupils are equal, round, and reactive to light. EOM are normal.  Neck: Normal range of motion. Neck supple. No JVD present.  Cardiovascular: Normal rate and regular rhythm. Exam reveals no gallop and no friction rub.  No murmur heard. Pulmonary/Chest: Effort normal and breath sounds normal. No stridor. No respiratory distress. She has no wheezes. She has no rales. She exhibits no tenderness.  Abdominal: Soft. Bowel sounds are normal. There is no tenderness. There is no rebound and no guarding.  Musculoskeletal: Normal range of motion. She exhibits no edema or tenderness.  No midline thoracic or lumbar tenderness.  No CVA tenderness.  No lower extremity swelling, asymmetry or tenderness.  Distal pulses are 2+.  Lymphadenopathy:    She has no cervical adenopathy.  Neurological: She is alert and oriented to person, place, and time.  Moving all extremities without focal deficit.  Sensation fully intact.  Skin: Skin is warm and dry. Capillary refill takes less than 2 seconds. No rash noted. She is not diaphoretic. No erythema.  Psychiatric: She has a normal mood and affect. Her behavior is normal.  Nursing note and vitals reviewed.    ED Treatments / Results  Labs (all labs ordered are listed, but only abnormal results are displayed) Labs Reviewed  BASIC METABOLIC PANEL - Abnormal; Notable for the following components:      Result Value   Potassium 3.2 (*)    All other components within normal limits  CBC - Abnormal; Notable for the following components:   RBC 5.51 (*)    MCH 24.9 (*)    All other components within normal limits  D-DIMER, QUANTITATIVE (NOT AT [facility]) - Abnormal; Notable for the following components:   D-Dimer, Quant 0.56 (*)    All other components within normal limits  I-STAT TROPONIN, ED  I-STAT BETA HCG BLOOD, ED ([facility], [facility], [facility] ONLY)  I-STAT TROPONIN, ED    EKG EKG Interpretation  Date/Time:  Monday March 25 2018 10:30:06 EST Ventricular Rate:  109 PR  Interval:  150 QRS Duration: 88 QT Interval:  358 QTC Calculation: 482 R Axis:   68 Text Interpretation:  Sinus tachycardia Nonspecific ST and T wave abnormality Abnormal ECG Confirmed by Julianne Rice [phone]) on 03/25/2018 4:00:51 PM   Radiology No results found.  Procedures Procedures (including critical care time)  Medications Ordered in ED Medications  potassium chloride SA (K-DUR,KLOR-CON) CR tablet 40 mEq (40 mEq Oral Given 03/25/18 1705)     Initial Impression / Assessment and Plan / ED Course  I have reviewed the triage vital signs and the nursing notes.  Pertinent labs & imaging results that were available during my care of the patient were reviewed by me and considered in my medical decision making (see chart for details).     EKG is essentially unchanged from previous  EKG.  Nonspecific inverted T waves.  Patient is symptom-free.  Have recommended repeat troponin and d-dimer,   Patient initially agreed to wait for repeat troponin and d-dimer however stated she needed to leave to pick up her daughter.  Advised to follow-up with her primary physician. Final Clinical Impressions(s) / ED Diagnoses   Final diagnoses:  Atypical chest pain    ED Discharge Orders    None       Julianne Rice, MD 03/27/18 1558

## 2018-03-25 NOTE — ED Triage Notes (Signed)
Pt in c/o chest pain and headache, also neck and shoulder pain, went to urgent care and sent here for abnormal EKG, no distress noted at this time

## 2018-03-25 NOTE — ED Notes (Signed)
Pt came to this RN and asked "can someone call me with my blood results because I have to leave" Pt encouraged to stay because dr is the only one who can give results. Pt stated that she had to leave. Left prior to being d/c.

## 2018-03-26 ENCOUNTER — Telehealth: Payer: Self-pay | Admitting: Family Medicine

## 2018-03-26 NOTE — Telephone Encounter (Signed)
Attempted to contact pt to remind them of their upcoming appt tomorrow on 03/27/2018. -CH °

## 2018-03-27 ENCOUNTER — Emergency Department (HOSPITAL_COMMUNITY): Payer: 59

## 2018-03-27 ENCOUNTER — Other Ambulatory Visit: Payer: Self-pay

## 2018-03-27 ENCOUNTER — Encounter (HOSPITAL_COMMUNITY): Payer: Self-pay

## 2018-03-27 ENCOUNTER — Ambulatory Visit (INDEPENDENT_AMBULATORY_CARE_PROVIDER_SITE_OTHER): Payer: 59 | Admitting: Family Medicine

## 2018-03-27 ENCOUNTER — Emergency Department (HOSPITAL_COMMUNITY)
Admission: EM | Admit: 2018-03-27 | Discharge: 2018-03-27 | Disposition: A | Discharge status: Home / Self Care | Payer: 59 | Attending: Emergency Medicine | Admitting: Emergency Medicine

## 2018-03-27 ENCOUNTER — Telehealth: Payer: Self-pay | Admitting: Family Medicine

## 2018-03-27 VITALS — BP 118/72 | HR 107 | Temp 98.1°F | Wt 182.0 lb

## 2018-03-27 DIAGNOSIS — R0602 Shortness of breath: Secondary | ICD-10-CM | POA: Diagnosis not present

## 2018-03-27 DIAGNOSIS — I10 Essential (primary) hypertension: Secondary | ICD-10-CM

## 2018-03-27 DIAGNOSIS — R000 Tachycardia, unspecified: Secondary | ICD-10-CM | POA: Insufficient documentation

## 2018-03-27 DIAGNOSIS — N644 Mastodynia: Secondary | ICD-10-CM | POA: Diagnosis not present

## 2018-03-27 DIAGNOSIS — K219 Gastro-esophageal reflux disease without esophagitis: Secondary | ICD-10-CM | POA: Diagnosis not present

## 2018-03-27 DIAGNOSIS — R0789 Other chest pain: Secondary | ICD-10-CM

## 2018-03-27 DIAGNOSIS — R079 Chest pain, unspecified: Secondary | ICD-10-CM | POA: Diagnosis present

## 2018-03-27 HISTORY — DX: Essential (primary) hypertension: I10

## 2018-03-27 LAB — I-STAT CHEM 8, ED
BUN: 7 mg/dL (ref 6–20)
Calcium, Ion: 1.15 mmol/L (ref 1.15–1.40)
Chloride: 100 mmol/L (ref 98–111)
Creatinine, Ser: 0.5 mg/dL (ref 0.44–1.00)
Glucose, Bld: 123 mg/dL — ABNORMAL HIGH (ref 70–99)
HCT: 44 % (ref 36.0–46.0)
HEMOGLOBIN: 15 g/dL (ref 12.0–15.0)
POTASSIUM: 3.3 mmol/L — AB (ref 3.5–5.1)
Sodium: 139 mmol/L (ref 135–145)
TCO2: 27 mmol/L (ref 22–32)

## 2018-03-27 MED ORDER — SODIUM CHLORIDE (PF) 0.9 % IJ SOLN
INTRAMUSCULAR | Status: AC
  Filled 2018-03-27: qty 50

## 2018-03-27 MED ORDER — OMEPRAZOLE 20 MG PO CPDR
20.0000 mg | DELAYED_RELEASE_CAPSULE | Freq: Every day | ORAL | 3 refills | Status: DC
Start: 2018-03-27 — End: 2019-04-02

## 2018-03-27 MED ORDER — IOPAMIDOL (ISOVUE-370) INJECTION 76%
100.0000 mL | Freq: Once | INTRAVENOUS | Status: AC | PRN
  Administered 2018-03-27: 100 mL via INTRAVENOUS

## 2018-03-27 MED ORDER — POTASSIUM CHLORIDE CRYS ER 20 MEQ PO TBCR: 40.0000 meq | EXTENDED_RELEASE_TABLET | Freq: Once | ORAL | Status: DC

## 2018-03-27 MED ORDER — IOPAMIDOL (ISOVUE-370) INJECTION 76%
INTRAVENOUS | Status: AC
  Filled 2018-03-27: qty 100

## 2018-03-27 MED ORDER — AMLODIPINE BESYLATE 10 MG PO TABS: 10.0000 mg | ORAL_TABLET | Freq: Every day | ORAL | 3 refills | Status: DC

## 2018-03-27 NOTE — ED Provider Notes (Signed)
[facility] DEPT Provider Note   CSN: [id number] Arrival date & time: 03/27/18  1047     History   Chief Complaint Chief Complaint  Patient presents with  . Referral    HPI Brooke Marks is a 37 y.o. female.  HPI Patient presented with left-sided chest pain.  Began around 3 days ago.  Went to [facility].  Had blood work at that time but did not stay after positive d-dimer.  Was supposed to follow-up PCP but because the d-dimer elevated the told her to come here.  Has an appointment at 130 today.  Still pain in left chest.  No abdominal pain.  No trauma.  Dull headache.  Patient thinks it could be some anxiety involved on this. Past Medical History:  Diagnosis Date  . Anxiety   . Depression   . Hypertension     There are no active problems to display for this patient.   Past Surgical History:  Procedure Laterality Date  . CESAREAN SECTION       OB History   None      Home Medications    Prior to Admission medications   Not on File    Family History No family history on file.  Social History Social History   Tobacco Use  . Smoking status: Never Smoker  . Smokeless tobacco: Never Used  Substance Use Topics  . Alcohol use: Never    Frequency: Never  . Drug use: Never     Allergies   Patient has no known allergies.   Review of Systems Review of Systems  Constitutional: Negative for appetite change.  HENT: Negative for congestion.   Respiratory: Positive for shortness of breath.   Cardiovascular: Positive for chest pain.  Gastrointestinal: Negative for abdominal pain.  Genitourinary: Negative for genital sores.  Musculoskeletal: Negative for back pain.  Skin: Negative for rash.  Neurological: Negative for speech difficulty and weakness.  Psychiatric/Behavioral: Negative for confusion. The patient is nervous/anxious.      Physical Exam Updated Vital Signs BP (!) 135/95   Pulse 98   Temp 98.8 F (37.1 C) (Oral)    Resp 16   Ht 5\' 2"  (1.575 m)   Wt 83.9 kg   LMP 03/26/2018   SpO2 100%   BMI 33.84 kg/m   Physical Exam  Eyes: Pupils are equal, round, and reactive to light.  Neck: Neck supple.  Cardiovascular:  Mild tachycardia  Pulmonary/Chest: Effort normal.  Abdominal: There is no tenderness.  Musculoskeletal: She exhibits no edema.  Neurological: She is alert.  Skin: Skin is warm.  Psychiatric:  Patient appears somewhat anxious     ED Treatments / Results  Labs (all labs ordered are listed, but only abnormal results are displayed) Labs Reviewed  I-STAT CHEM 8, ED - Abnormal; Notable for the following components:      Result Value   Potassium 3.3 (*)    Glucose, Bld 123 (*)    All other components within normal limits    EKG None  Radiology Ct Angio Chest Pe W And/or Wo Contrast  Result Date: 03/27/2018 CLINICAL DATA:  Pain in the left breast. EXAM: CT ANGIOGRAPHY CHEST WITH CONTRAST TECHNIQUE: Multidetector CT imaging of the chest was performed using the standard protocol during bolus administration of intravenous contrast. Multiplanar CT image reconstructions and MIPs were obtained to evaluate the vascular anatomy. CONTRAST:  189mL ISOVUE-370 IOPAMIDOL (ISOVUE-370) INJECTION 76% COMPARISON:  None. FINDINGS: Cardiovascular: Satisfactory opacification of the pulmonary arteries to  the segmental level. No evidence of pulmonary embolism. Normal heart size. No pericardial effusion. Mediastinum/Nodes: No enlarged mediastinal, hilar, or axillary lymph nodes. Thyroid gland, trachea, and esophagus demonstrate no significant findings. Lungs/Pleura: Lungs are clear. No pleural effusion or pneumothorax. Upper Abdomen: 4.7 x 3.3 cm hypodense peripherally enhancing fluid collection within the spleen. Musculoskeletal: No chest wall abnormality. No acute or significant osseous findings. Review of the MIP images confirms the above findings. IMPRESSION: 1. No evidence pulmonary embolus. 2. No thoracic  aortic aneurysm or dissection. 3. Indeterminate 4.7 x 3.3 cm hypodense peripherally enhancing fluid collection in the spleen. This may reflect a splenic abscess versus complex cyst versus liquified hematoma. Correlate with laboratory values. Electronically Signed   By: Kathreen Devoid   On: 03/27/2018 12:54    Procedures Procedures (including critical care time)  Medications Ordered in ED Medications  sodium chloride (PF) 0.9 % injection (has no administration in time range)  iopamidol (ISOVUE-370) 76 % injection 100 mL (100 mLs Intravenous Contrast Given 03/27/18 1240)     Initial Impression / Assessment and Plan / ED Course  I have reviewed the triage vital signs and the nursing notes.  Pertinent labs & imaging results that were available during my care of the patient were reviewed by me and considered in my medical decision making (see chart for details).     Patient with shortness of breath and tachycardia.  D-dimer positive few days ago.  Mild hypokalemia is been rechecked.  CT angiography does not show pulmonary embolism.  Does have some splenic lesion.  No fever.  No trauma.  If this was the cause of the pain from 3 days ago there is been no bleeding.  Have follow-up with PCP to help further evaluation of the spleen as needed.  Also may be anxiety component to Final Clinical Impressions(s) / ED Diagnoses   Final diagnoses:  Tachycardia    ED Discharge Orders    None       Davonna Belling, MD 03/27/18 [phone]

## 2018-03-27 NOTE — Telephone Encounter (Signed)
Spoke with patient, she will go to ER for evaluation.

## 2018-03-27 NOTE — Progress Notes (Signed)
Subjective:  Brooke Marks is a 37 y.o. female who presents to the [facility] today with a chief complaint of high blood pressure.   HPI:  [name] to [facility] ER today and had a study that was negative for blood clot. Over the weekend has noticed that BP was 140-150s/80s with HR in 100s which she checks frequently at home.  She self increased her amlodipine to 57m on Sunday night. She denies any missed doses. She has had a lot of stress in her life recently in the setting of known anxiety.  Still has chest tightness and some lightheadedness which is unchanged over the past few days.  Has a lot of acid reflux, worse since eating garlic over the weekend. Hasn't really eatn much since because she's been so nervous.    ROS: Per HPI  Social Hx: She reports that she has never smoked. She has never used smokeless tobacco. She reports that she does not drink alcohol or use drugs.  Objective:  Physical Exam: BP 118/72   Pulse (!) 107   Temp 98.1 F (36.7 C) (Oral)   Wt 182 lb (82.6 kg)   LMP 03/18/2018   SpO2 100%   BMI 32.24 kg/m   Gen: NAD, resting comfortably CV: RRR, not tachycardic on auscultation, with no murmurs appreciated Pulm: NWOB, CTAB with no crackles, wheezes, or rhonchi GI: Normal bowel sounds present. Soft, Nontender, Nondistended. MSK: no edema, cyanosis, or clubbing noted Skin: warm, dry Neuro: grossly normal, moves all extremities Psych: Normal affect and thought content  Results for orders placed or performed during the hospital encounter of 03/25/18 (from the past 72 hour(s))  Basic metabolic panel     Status: Abnormal   Collection Time: 03/25/18 10:43 AM  Result Value Ref Range   Sodium 138 135 - 145 mmol/L   Potassium 3.2 (L) 3.5 - 5.1 mmol/L   Chloride 106 98 - 111 mmol/L   CO2 27 22 - 32 mmol/L   Glucose, Bld 99 70 - 99 mg/dL   BUN 6 6 - 20 mg/dL   Creatinine, Ser 0.62 0.44 - 1.00 mg/dL   Calcium 8.9 8.9 - 10.3 mg/dL   GFR calc non Af Amer >60 >60  mL/min   GFR calc Af Amer >60 >60 mL/min    Comment: (NOTE) The eGFR has been calculated using the CKD EPI equation. This calculation has not been validated in all clinical situations. eGFR's persistently <60 mL/min signify possible Chronic Kidney Disease.    Anion gap 5 5 - 15    Comment: Performed at [id number], [place] [address] CBC     Status: Abnormal   Collection Time: 03/25/18 10:43 AM  Result Value Ref Range   WBC 6.3 4.0 - 10.5 K/uL   RBC 5.51 (H) 3.87 - 5.11 MIL/uL   Hemoglobin 13.7 12.0 - 15.0 g/dL   HCT 44.4 36.0 - 46.0 %   MCV 80.6 80.0 - 100.0 fL   MCH 24.9 (L) 26.0 - 34.0 pg   MCHC 30.9 30.0 - 36.0 g/dL   RDW 15.4 11.5 - 15.5 %   Platelets 354 150 - 400 K/uL   nRBC 0.0 0.0 - 0.2 %    Comment: Performed at [facility] [id number] [place] [id number] I-Stat beta hCG blood, ED     Status: None   Collection Time: 03/25/18 11:08 AM  Result Value Ref Range   I-stat hCG,  quantitative <5.0 <5 mIU/mL   Comment 3            Comment:   GEST. AGE      CONC.  (mIU/mL)   <=1 WEEK        5 - 50     2 WEEKS       50 - 500     3 WEEKS       100 - 10,000     4 WEEKS     1,000 - 30,000        FEMALE AND NON-PREGNANT FEMALE:     LESS THAN 5 mIU/mL   I-stat troponin, ED     Status: None   Collection Time: 03/25/18 11:09 AM  Result Value Ref Range   Troponin i, poc 0.00 0.00 - 0.08 ng/mL   Comment 3            Comment: Due to the release kinetics of cTnI, a negative result within the first hours of the onset of symptoms does not rule out myocardial infarction with certainty. If myocardial infarction is still suspected, repeat the test at appropriate intervals.   D-dimer, quantitative (not at [facility])     Status: Abnormal   Collection Time: 03/25/18  5:12 PM  Result Value Ref Range   D-Dimer, Quant 0.56 (H) 0.00 - 0.50 ug/mL-FEU    Comment: (NOTE) At the manufacturer cut-off of 0.50 ug/mL FEU, this assay has  been documented to exclude PE with a sensitivity and negative predictive value of 97 to 99%.  At this time, this assay has not been approved by the FDA to exclude DVT/VTE. Results should be correlated with clinical presentation. Performed at [facility], [place] [address] [place], [place] [address]   I-stat troponin, ED     Status: None   Collection Time: 03/25/18  5:20 PM  Result Value Ref Range   Troponin i, poc 0.01 0.00 - 0.08 ng/mL   Comment 3            Comment: Due to the release kinetics of cTnI, a negative result within the first hours of the onset of symptoms does not rule out myocardial infarction with certainty. If myocardial infarction is still suspected, repeat the test at appropriate intervals.    From patient's second chart undergoing merge CT ANGIOGRAPHY CHEST WITH CONTRAST 03/27/2018 IMPRESSION: 1. No evidence pulmonary embolus. 2. No thoracic aortic aneurysm or dissection. 3. Indeterminate 4.7 x 3.3 cm hypodense peripherally enhancing fluid collection in the spleen. This may reflect a splenic abscess versus complex cyst versus liquified hematoma. Correlate with laboratory values.   Assessment/Plan:  CHEST PAIN, ATYPICAL Patient has had long-standing atypical chest pain.  She had a recent ED visit where she was not fully evaluated but had negative delta troponin as well as no ST changes on EKG.  She did have a positive d-dimer at that time but went back to the emergency room earlier today and had a negative CTA chest which is reassuring.  Uncertain etiology of her chest pain.  It could be multifactorial from her acid reflux or stress/anxiety.  Discussed that if she does not have improvement with resumption of her acid reflux medication she should come back for further evaluation of her anxiety and atypical chest pain.  GERD (gastroesophageal reflux disease) Previously on omeprazole with control of her acid reflux symptoms.  She lately has been having  worsening indigestion especially after eating some acid inducing foods over the weekend.  This  could be the reason for a chest discomfort so discussed restarting omeprazole today.  Hypertension Patient has been on 5 mg of amlodipine chronically and has recently self titrated up to 10 mg daily over the weekend.  Her blood pressure is under good control today.  She does endorse a history of elevated blood pressures at home however per chart review this is not been an issue during her office visits.  Discussed continuing on 10 mg daily since she is tolerating this medication well.  Recommended no additional antihypertensive at this time but rather more thorough evaluation of her anxiety/stress status at an appointment with her PCP.   Bufford Lope, DO PGY-3, [facility] 03/27/2018 1:53 PM

## 2018-03-27 NOTE — Assessment & Plan Note (Signed)
Patient has had long-standing atypical chest pain.  She had a recent ED visit where she was not fully evaluated but had negative delta troponin as well as no ST changes on EKG.  She did have a positive d-dimer at that time but went back to the emergency room earlier today and had a negative CTA chest which is reassuring.  Uncertain etiology of her chest pain.  It could be multifactorial from her acid reflux or stress/anxiety.  Discussed that if she does not have improvement with resumption of her acid reflux medication she should come back for further evaluation of her anxiety and atypical chest pain.

## 2018-03-27 NOTE — Patient Instructions (Signed)
I have refilled your amlodipine, changed to 10 mg tablets so you can take 1 a day  Please restart your acid reflux medication again and try it for a couple of weeks. See if you feel better.  You can make an appointment with your primary care doctor to talk about your anxiety in a couple of weeks.

## 2018-03-27 NOTE — Assessment & Plan Note (Signed)
Previously on omeprazole with control of her acid reflux symptoms.  She lately has been having worsening indigestion especially after eating some acid inducing foods over the weekend.  This could be the reason for a chest discomfort so discussed restarting omeprazole today.

## 2018-03-27 NOTE — Discharge Instructions (Addendum)
Follow-up with your doctor this afternoon as planned.

## 2018-03-27 NOTE — ED Triage Notes (Signed)
Since Saturday, pt states she had a sharp pain under her left breast. Pt states on Sunday the back of her head and neck started hurting. Pt states she also has hx of anxiety. Pt states Monday at work, her BP was high and her hands became sweaty. Pt states she went to [facility], where they drew blood and got imaging. Pt states that she left AMA. Pt states that her PCP told her to come back, because her d dimer was elevated.  Pt is very tearful in triage.

## 2018-03-27 NOTE — Telephone Encounter (Signed)
Left message for patient that instead of being seen in clinic today that she should go back to the ER for evaluation.  Asked for patient to call back.  Based on her blood work from the ER showing a positive d-dimer as well as a EKG showing sinus tachycardia on 03/25/2018 that is concerning for possible PE.  Precepted with Dr. Owens Shark.

## 2018-03-27 NOTE — Assessment & Plan Note (Signed)
Patient has been on 5 mg of amlodipine chronically and has recently self titrated up to 10 mg daily over the weekend.  Her blood pressure is under good control today.  She does endorse a history of elevated blood pressures at home however per chart review this is not been an issue during her office visits.  Discussed continuing on 10 mg daily since she is tolerating this medication well.  Recommended no additional antihypertensive at this time but rather more thorough evaluation of her anxiety/stress status at an appointment with her PCP.

## 2018-04-03 ENCOUNTER — Ambulatory Visit: Payer: 59 | Admitting: Family Medicine

## 2018-12-17 ENCOUNTER — Ambulatory Visit: Payer: Self-pay | Admitting: Family Medicine

## 2018-12-17 ENCOUNTER — Other Ambulatory Visit: Payer: Self-pay

## 2018-12-17 ENCOUNTER — Other Ambulatory Visit: Payer: Self-pay | Admitting: Family Medicine

## 2018-12-17 VITALS — BP 128/72 | Ht 62.0 in | Wt 186.0 lb

## 2018-12-17 DIAGNOSIS — O24419 Gestational diabetes mellitus in pregnancy, unspecified control: Secondary | ICD-10-CM | POA: Insufficient documentation

## 2018-12-17 DIAGNOSIS — N644 Mastodynia: Secondary | ICD-10-CM

## 2018-12-17 HISTORY — DX: Gestational diabetes mellitus in pregnancy, unspecified control: O24.419

## 2018-12-17 NOTE — Patient Instructions (Addendum)
It was great to see you!  Our plans for today:  -We are obtaining a mammogram.  Call the attached number to schedule an appointment. -If you start to have leakage from your breast, change in the skin around your breast, or fevers or rashes, come back to be seen. -Make sure you have a well supporting bra.  Some women have had a decrease in cyclical menstrual pain with a low carbohydrate diet as well as regular exercise.  Take care and seek immediate care sooner if you develop any concerns.   Dr. [facility]

## 2018-12-17 NOTE — Assessment & Plan Note (Signed)
Intermittent burning pain on the outside of patient's left breast associated with palpable lump per patient.  Normal breast exam today without palpable lumps though did have pain on the outside of her breast, no leakage or discharge on exam.  Not likely pregnant given prior negative pregnancy test and last menstrual cycle 7/13 which was normal for her.  Given pain is different than chronic acid reflux and denies chest pain, likely not cardiac to GI in etiology.  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  No family history of breast cancer and no red flags on exam or history, however given pain is new and persistent, will obtain diagnostic mammogram with probable ultrasound follow-up.  Counseling done regarding proper breast support and ways to reduce pain, see AVS.  Return precautions given.

## 2018-12-17 NOTE — Progress Notes (Signed)
Subjective:   Patient ID: Brooke Marks    DOB: 03-26-1981, 38 y.o. female   MRN: [id number]  Brooke Marks is a 38 y.o. female with a history of HTN, GERD, HSV, anxiety w/ panic attacks, obesity here for   Breast pain - On 7/6 noticed pain on the outside L breast with lump in that area, went away 7/13 with period. Neg preg test at that time. Then noticed pain again yesterday in the same area when she took her bra off.  On self breast exam she noticed a lump in that area and has noticed burning pain throughout her entire left breast with sensitivity of her nipple.   - She denies any leakage from her nipple.  - Last period lasted 7/13-7/18 and was a normal cycle for her. - was pregnant in January. Some breast soreness at that time but no burning. - stopping pumping in April. Had some burning in her breast when she was engorged.  - denies leakage from breast - no issues in R breast.  - never had imaging of breast, no FH of breast cancer. -Pain is different than acid reflux.  No issues with eating. -Denies chest pain or difficulties breathing  Review of Systems:  Per HPI.  [place], medications and smoking status reviewed.  Objective:   BP 128/72   Ht 5\' 2"  (1.575 m)   Wt 186 lb (84.4 kg)   LMP 12/02/2018   BMI 34.02 kg/m  Vitals and nursing note reviewed.  General: Obese female, in no acute distress with non-toxic appearance Breasts: breasts appear normal, no suspicious masses, no skin or nipple changes or axillary nodes. CV: regular rate and rhythm without murmurs, rubs, or gallops Lungs: clear to auscultation bilaterally with normal work of breathing Skin: warm, dry, no rashes or lesions Extremities: warm and well perfused, normal tone MSK: ROM grossly intact, strength intact, gait normal Neuro: Alert and oriented, speech normal  Assessment & Plan:   Breast pain Intermittent burning pain on the outside of patient's left breast associated with palpable lump per patient.   Normal breast exam today without palpable lumps though did have pain on the outside of her breast, no leakage or discharge on exam.  Not likely pregnant given prior negative pregnancy test and last menstrual cycle 7/13 which was normal for her.  Given pain is different than chronic acid reflux and denies chest pain, likely not cardiac to GI in etiology.  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  No family history of breast cancer and no red flags on exam or history, however given pain is new and persistent, will obtain diagnostic mammogram with probable ultrasound follow-up.  Counseling done regarding proper breast support and ways to reduce pain, see AVS.  Return precautions given.  Orders Placed This Encounter  Procedures  . MM Digital Diagnostic Bilat    Standing Status:   Future    Standing Expiration Date:   02/17/2020    Order Specific Question:   Reason for Exam (SYMPTOM  OR DIAGNOSIS REQUIRED)    Answer:   L breast pain    Order Specific Question:   Is the patient pregnant?    Answer:   No    Order Specific Question:   Preferred imaging location?    Answer:   [facility]   No orders of the defined types were placed in this encounter.   Rory Percy, DO PGY-3, [facility] 12/17/2018 10:25 AM

## 2018-12-25 ENCOUNTER — Other Ambulatory Visit: Payer: Self-pay

## 2018-12-25 ENCOUNTER — Ambulatory Visit
Admission: RE | Admit: 2018-12-25 | Discharge: 2018-12-25 | Disposition: A | Discharge status: Home / Self Care | Payer: 59 | Source: Ambulatory Visit | Attending: Family Medicine | Admitting: Family Medicine

## 2018-12-25 ENCOUNTER — Other Ambulatory Visit: Payer: Self-pay | Admitting: Family Medicine

## 2018-12-25 DIAGNOSIS — N644 Mastodynia: Secondary | ICD-10-CM

## 2019-03-29 ENCOUNTER — Other Ambulatory Visit: Payer: Self-pay

## 2019-03-29 ENCOUNTER — Inpatient Hospital Stay (HOSPITAL_COMMUNITY)
Admission: EM | Admit: 2019-03-29 | Discharge: 2019-04-02 | DRG: 331 | Disposition: A | Discharge status: Home / Self Care | Payer: 59 | Attending: General Surgery | Admitting: General Surgery

## 2019-03-29 ENCOUNTER — Emergency Department (HOSPITAL_COMMUNITY): Payer: 59

## 2019-03-29 ENCOUNTER — Encounter (HOSPITAL_COMMUNITY): Payer: Self-pay | Admitting: Emergency Medicine

## 2019-03-29 DIAGNOSIS — K388 Other specified diseases of appendix: Secondary | ICD-10-CM | POA: Diagnosis present

## 2019-03-29 DIAGNOSIS — E1165 Type 2 diabetes mellitus with hyperglycemia: Secondary | ICD-10-CM | POA: Diagnosis present

## 2019-03-29 DIAGNOSIS — A7481 Chlamydial peritonitis: Secondary | ICD-10-CM | POA: Diagnosis present

## 2019-03-29 DIAGNOSIS — K3580 Unspecified acute appendicitis: Secondary | ICD-10-CM | POA: Diagnosis present

## 2019-03-29 DIAGNOSIS — Z833 Family history of diabetes mellitus: Secondary | ICD-10-CM

## 2019-03-29 DIAGNOSIS — Z98891 History of uterine scar from previous surgery: Secondary | ICD-10-CM

## 2019-03-29 DIAGNOSIS — F329 Major depressive disorder, single episode, unspecified: Secondary | ICD-10-CM | POA: Diagnosis present

## 2019-03-29 DIAGNOSIS — K5900 Constipation, unspecified: Secondary | ICD-10-CM | POA: Diagnosis present

## 2019-03-29 DIAGNOSIS — K3533 Acute appendicitis with perforation and localized peritonitis, with abscess: Secondary | ICD-10-CM | POA: Diagnosis not present

## 2019-03-29 DIAGNOSIS — R109 Unspecified abdominal pain: Secondary | ICD-10-CM | POA: Diagnosis not present

## 2019-03-29 DIAGNOSIS — Z8632 Personal history of gestational diabetes: Secondary | ICD-10-CM

## 2019-03-29 DIAGNOSIS — Z8249 Family history of ischemic heart disease and other diseases of the circulatory system: Secondary | ICD-10-CM

## 2019-03-29 DIAGNOSIS — F3281 Premenstrual dysphoric disorder: Secondary | ICD-10-CM | POA: Diagnosis present

## 2019-03-29 DIAGNOSIS — R739 Hyperglycemia, unspecified: Secondary | ICD-10-CM

## 2019-03-29 DIAGNOSIS — K353 Acute appendicitis with localized peritonitis, without perforation or gangrene: Secondary | ICD-10-CM

## 2019-03-29 DIAGNOSIS — I10 Essential (primary) hypertension: Secondary | ICD-10-CM | POA: Diagnosis present

## 2019-03-29 DIAGNOSIS — Z20828 Contact with and (suspected) exposure to other viral communicable diseases: Secondary | ICD-10-CM | POA: Diagnosis present

## 2019-03-29 DIAGNOSIS — K219 Gastro-esophageal reflux disease without esophagitis: Secondary | ICD-10-CM | POA: Diagnosis present

## 2019-03-29 DIAGNOSIS — F411 Generalized anxiety disorder: Secondary | ICD-10-CM | POA: Diagnosis present

## 2019-03-29 DIAGNOSIS — D734 Cyst of spleen: Secondary | ICD-10-CM

## 2019-03-29 DIAGNOSIS — N83291 Other ovarian cyst, right side: Secondary | ICD-10-CM | POA: Diagnosis present

## 2019-03-29 LAB — COMPREHENSIVE METABOLIC PANEL
ALT: 12 U/L (ref 0–44)
AST: 15 U/L (ref 15–41)
Albumin: 4 g/dL (ref 3.5–5.0)
Alkaline Phosphatase: 57 U/L (ref 38–126)
Anion gap: 10 (ref 5–15)
BUN: 11 mg/dL (ref 6–20)
CO2: 24 mmol/L (ref 22–32)
Calcium: 8.6 mg/dL — ABNORMAL LOW (ref 8.9–10.3)
Chloride: 102 mmol/L (ref 98–111)
Creatinine, Ser: 0.56 mg/dL (ref 0.44–1.00)
GFR calc Af Amer: >60 mL/min (ref >60)
GFR calc non Af Amer: >60 mL/min (ref >60)
Glucose, Bld: 85 mg/dL (ref 70–99)
Potassium: 3.4 mmol/L — ABNORMAL LOW (ref 3.5–5.1)
Sodium: 136 mmol/L (ref 135–145)
Total Bilirubin: 0.8 mg/dL (ref 0.3–1.2)
Total Protein: 8.6 g/dL — ABNORMAL HIGH (ref 6.5–8.1)

## 2019-03-29 LAB — URINALYSIS, ROUTINE W REFLEX MICROSCOPIC
Bilirubin Urine: NEGATIVE
Glucose, UA: NEGATIVE mg/dL
Hgb urine dipstick: NEGATIVE
Ketones, ur: 20 mg/dL — AB
Leukocytes,Ua: NEGATIVE
Nitrite: NEGATIVE
Protein, ur: NEGATIVE mg/dL
Specific Gravity, Urine: 1.018 (ref 1.005–1.030)
pH: 6 (ref 5.0–8.0)

## 2019-03-29 LAB — CBC
HCT: 40.1 % (ref 36.0–46.0)
Hemoglobin: 12.4 g/dL (ref 12.0–15.0)
MCH: 24.6 pg — ABNORMAL LOW (ref 26.0–34.0)
MCHC: 30.9 g/dL (ref 30.0–36.0)
MCV: 79.6 fL — ABNORMAL LOW (ref 80.0–100.0)
Platelets: 393 10*3/uL (ref 150–400)
RBC: 5.04 MIL/uL (ref 3.87–5.11)
RDW: 15.1 % (ref 11.5–15.5)
WBC: 7.6 10*3/uL (ref 4.0–10.5)
nRBC: 0 % (ref 0.0–0.2)

## 2019-03-29 LAB — I-STAT BETA HCG BLOOD, ED (MC, WL, AP ONLY): I-stat hCG, quantitative: <5 m[IU]/mL (ref <5)

## 2019-03-29 LAB — LIPASE, BLOOD: Lipase: 25 U/L (ref 11–51)

## 2019-03-29 MED ORDER — ONDANSETRON HCL 4 MG/2ML IJ SOLN: 4.0000 mg | Freq: Four times a day (QID) | INTRAMUSCULAR | Status: DC | PRN

## 2019-03-29 MED ORDER — HYDROCODONE-ACETAMINOPHEN 5-325 MG PO TABS
1.0000 | ORAL_TABLET | ORAL | Status: DC | PRN
  Filled 2019-03-29: qty 1

## 2019-03-29 MED ORDER — METRONIDAZOLE IN NACL 5-0.79 MG/ML-% IV SOLN
500.0000 mg | Freq: Three times a day (TID) | INTRAVENOUS | Status: DC
  Administered 2019-03-30 – 2019-04-01 (×7): 500 mg via INTRAVENOUS
  Filled 2019-03-29 (×8): qty 100

## 2019-03-29 MED ORDER — KCL IN DEXTROSE-NACL 20-5-0.45 MEQ/L-%-% IV SOLN
INTRAVENOUS | Status: DC
  Administered 2019-03-30 – 2019-04-01 (×3): via INTRAVENOUS
  Filled 2019-03-29 (×4): qty 1000

## 2019-03-29 MED ORDER — IOHEXOL 300 MG/ML  SOLN
100.0000 mL | Freq: Once | INTRAMUSCULAR | Status: AC | PRN
  Administered 2019-03-29: 100 mL via INTRAVENOUS

## 2019-03-29 MED ORDER — ONDANSETRON 4 MG PO TBDP
4.0000 mg | ORAL_TABLET | Freq: Four times a day (QID) | ORAL | Status: DC | PRN
  Administered 2019-03-30: 4 mg via ORAL
  Filled 2019-03-29 (×2): qty 1

## 2019-03-29 MED ORDER — PIPERACILLIN-TAZOBACTAM 3.375 G IVPB 30 MIN
3.3750 g | Freq: Once | INTRAVENOUS | Status: AC
  Administered 2019-03-29: 3.375 g via INTRAVENOUS
  Filled 2019-03-29: qty 50

## 2019-03-29 MED ORDER — HYDROMORPHONE HCL 1 MG/ML IJ SOLN
1.0000 mg | INTRAMUSCULAR | Status: DC | PRN
  Administered 2019-03-30 – 2019-03-31 (×4): 1 mg via INTRAVENOUS
  Filled 2019-03-29 (×4): qty 1

## 2019-03-29 MED ORDER — SODIUM CHLORIDE 0.9 % IV SOLN
2.0000 g | Freq: Every day | INTRAVENOUS | Status: DC
  Administered 2019-03-30 – 2019-04-01 (×3): 2 g via INTRAVENOUS
  Filled 2019-03-29 (×3): qty 2

## 2019-03-29 MED ORDER — ACETAMINOPHEN 325 MG PO TABS: 650.0000 mg | ORAL_TABLET | Freq: Four times a day (QID) | ORAL | Status: DC | PRN

## 2019-03-29 MED ORDER — ACETAMINOPHEN 650 MG RE SUPP: 650.0000 mg | Freq: Four times a day (QID) | RECTAL | Status: DC | PRN

## 2019-03-29 NOTE — H&P (Signed)
Brooke Marks is an 38 y.o. female.    General Surgery [facility]  Chief Complaint: abdominal pain, acute appendicitis  HPI: Patient is a 38 year old female who presents to the emergency department with a 62-month history of intermittent right lower quadrant abdominal pain.  Pain usually improved after her menstrual cycle.  Patient has had right lower quadrant pain persistently for the past 8 days.  Her menstrual cycle ended 5 days ago and pain persisted.  She has had nausea.  She has noted some chills.  She is noted some constipation.  Patient presented to the emergency department for evaluation.  Laboratory studies show a normal white blood cell count of 7.6.  She is afebrile.  A CT scan of the abdomen and pelvis was obtained which shows a dilated appendix with periappendiceal inflammation which is fairly extensive consistent with acute appendicitis.  General surgery was called for evaluation and management.  Patient has a surgical history of cesarean section.  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  Past Medical History:  Diagnosis Date  . Anxiety   . Chlamydia   . Depression   . Diabetes mellitus without complication ([place])    Gestational  . Hypertension   . PIH (pregnancy induced hypertension) 01/27/2016  . S/P cesarean section 01/27/2016    Past Surgical History:  Procedure Laterality Date  . CESAREAN SECTION    . CESAREAN SECTION N/A 01/27/2016   Procedure: CESAREAN SECTION;  Surgeon: Janyth Contes, MD;  Location: [facility];  Service: Obstetrics;  Laterality: N/A;  MD requests RNFA  . WISDOM TOOTH EXTRACTION      Family History  Problem Relation Age of Onset  . Diabetes Mother   . Hypertension Father    Social History:  reports that she has never smoked. She has never used smokeless tobacco. She reports that she does not drink alcohol or use drugs.  Allergies: No Known Allergies  (Not in a hospital admission)   Results for  orders placed or performed during the hospital encounter of 03/29/19 (from the past 48 hour(s))  Lipase, blood     Status: None   Collection Time: 03/29/19  3:37 PM  Result Value Ref Range   Lipase 25 11 - 51 U/L    Comment: Performed at [facility], [place] [address] [place], [place] [address]  Comprehensive metabolic panel     Status: Abnormal   Collection Time: 03/29/19  3:37 PM  Result Value Ref Range   Sodium 136 135 - 145 mmol/L   Potassium 3.4 (L) 3.5 - 5.1 mmol/L   Chloride 102 98 - 111 mmol/L   CO2 24 22 - 32 mmol/L   Glucose, Bld 85 70 - 99 mg/dL   BUN 11 6 - 20 mg/dL   Creatinine, Ser 0.56 0.44 - 1.00 mg/dL   Calcium 8.6 (L) 8.9 - 10.3 mg/dL   Total Protein 8.6 (H) 6.5 - 8.1 g/dL   Albumin 4.0 3.5 - 5.0 g/dL   AST 15 15 - 41 U/L   ALT 12 0 - 44 U/L   Alkaline Phosphatase 57 38 - 126 U/L   Total Bilirubin 0.8 0.3 - 1.2 mg/dL   GFR calc non Af Amer >60 >60 mL/min   GFR calc Af Amer >60 >60 mL/min   Anion gap 10 5 - 15    Comment: Performed at [facility], [place] [address], [place], [place] [address]  CBC  Status: Abnormal   Collection Time: 03/29/19  3:37 PM  Result Value Ref Range   WBC 7.6 4.0 - 10.5 K/uL   RBC 5.04 3.87 - 5.11 MIL/uL   Hemoglobin 12.4 12.0 - 15.0 g/dL   HCT 40.1 36.0 - 46.0 %   MCV 79.6 (L) 80.0 - 100.0 fL   MCH 24.6 (L) 26.0 - 34.0 pg   MCHC 30.9 30.0 - 36.0 g/dL   RDW 15.1 11.5 - 15.5 %   Platelets 393 150 - 400 K/uL   nRBC 0.0 0.0 - 0.2 %    Comment: Performed at [facility], [place] [address], [place], [place] [address]  Urinalysis, Routine w reflex microscopic     Status: Abnormal   Collection Time: 03/29/19  3:37 PM  Result Value Ref Range   Color, Urine YELLOW YELLOW   APPearance HAZY (A) CLEAR   Specific Gravity, Urine 1.018 1.005 - 1.030   pH 6.0 5.0 - 8.0   Glucose, UA NEGATIVE NEGATIVE mg/dL   Hgb urine dipstick NEGATIVE NEGATIVE   Bilirubin Urine NEGATIVE NEGATIVE    Ketones, ur 20 (A) NEGATIVE mg/dL   Protein, ur NEGATIVE NEGATIVE mg/dL   Nitrite NEGATIVE NEGATIVE   Leukocytes,Ua NEGATIVE NEGATIVE    Comment: Performed at [place] [address], [place], [place] [address]  I-Stat beta hCG blood, ED     Status: None   Collection Time: 03/29/19  3:41 PM  Result Value Ref Range   I-stat hCG, quantitative <5.0 <5 mIU/mL   Comment 3            Comment:   GEST. AGE      CONC.  (mIU/mL)   <=1 WEEK        5 - 50     2 WEEKS       50 - 500     3 WEEKS       100 - 10,000     4 WEEKS     1,000 - 30,000        FEMALE AND NON-PREGNANT FEMALE:     LESS THAN 5 mIU/mL    Ct Abdomen Pelvis W Contrast  Result Date: 03/29/2019 CLINICAL DATA:  Abdominal pain.  Appendicitis suspected. EXAM: CT ABDOMEN AND PELVIS WITH CONTRAST TECHNIQUE: Multidetector CT imaging of the abdomen and pelvis was performed using the standard protocol following bolus administration of intravenous contrast. CONTRAST:  127mL OMNIPAQUE IOHEXOL 300 MG/ML  SOLN COMPARISON:  None. FINDINGS: Lower chest: The lung bases are clear. The heart size is normal. Hepatobiliary: The liver is normal. Normal gallbladder.There is no biliary ductal dilation. Pancreas: Normal contours without ductal dilatation. No peripancreatic fluid collection. Spleen: There is a slightly complex 4.2 cm hypoattenuating mass in the lower pole the spleen. The margins are irregular, especially on the coronal series. Adrenals/Urinary Tract: --Adrenal glands: No adrenal hemorrhage. --Right kidney/ureter: No hydronephrosis or perinephric hematoma. --Left kidney/ureter: No hydronephrosis or perinephric hematoma. --Urinary bladder: Unremarkable. Stomach/Bowel: --Stomach/Duodenum: No hiatal hernia or other gastric abnormality. Normal duodenal course and caliber. --Small bowel: No dilatation or inflammation. --Colon: No focal abnormality. --Appendix: The appendix is significantly dilated. There are extensive inflammatory  changes in the right lower quadrant. There is no free air. There is a small amount of reactive free fluid in the pelvis. Vascular/Lymphatic: Normal course and caliber of the major abdominal vessels. --No retroperitoneal lymphadenopathy. --No mesenteric lymphadenopathy. --No pelvic or inguinal lymphadenopathy. Reproductive: Unremarkable Other: No ascites or free air. The abdominal  wall is normal. Musculoskeletal. No acute displaced fractures. IMPRESSION: 1. Extensive inflammatory changes in the right lower quadrant with a significantly dilated appendix. Findings are consistent with acute appendicitis. While there is no free air, perforation is not entirely excluded. There is no abscess. 2. Small amount of reactive free fluid in the pelvis. 3. 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 This is essentially stable from prior study in 2019 and is favored to represent a benign process. However, it remains indeterminate. Follow-up with a nonemergent outpatient MRI is recommended for further evaluation of this finding. Electronically Signed   By: Constance Holster M.D.   On: 03/29/2019 22:42    Review of Systems  Constitutional: Positive for chills.  HENT: Negative.   Eyes: Negative.   Respiratory: Negative.   Cardiovascular: Negative.   Gastrointestinal: Positive for abdominal pain, constipation and nausea. Negative for vomiting.  Genitourinary: Negative.   Musculoskeletal: Negative.   Skin: Negative.   Neurological: Negative.   Endo/Heme/Allergies: Negative.   Psychiatric/Behavioral: Negative.     Blood pressure 126/79, pulse 78, temperature 98.1 F (36.7 C), temperature source Oral, resp. rate 16, last menstrual period 03/25/2019, SpO2 97 %, unknown if currently breastfeeding. Physical Exam  Constitutional: She is oriented to person, place, and time. She appears well-developed and well-nourished. No distress.  HENT:  Head: Normocephalic and atraumatic.  Right Ear: External ear normal.   Left Ear: External ear normal.  Eyes: Pupils are equal, round, and reactive to light. Conjunctivae are normal. No scleral icterus.  Neck: Normal range of motion. Neck supple. No tracheal deviation present. No thyromegaly present.  Cardiovascular: Normal rate, regular rhythm and normal heart sounds.  Respiratory: Effort normal and breath sounds normal. No respiratory distress. She has no wheezes.  GI: Soft. Bowel sounds are normal. She exhibits no distension and no mass. There is abdominal tenderness (RLQ). There is no rebound and no guarding.  Musculoskeletal: Normal range of motion.        General: No deformity or edema.  Lymphadenopathy:    She has no cervical adenopathy.  Neurological: She is alert and oriented to person, place, and time.  Skin: Skin is warm and dry. She is not diaphoretic.  Psychiatric: She has a normal mood and affect. Her behavior is normal.     Assessment/Plan Acute appendicitis - admit to general surgery service - empiric abx's - await results of Covid testing - NPO after MN - plan lap appendectomy in AM 11/8  Patient has an atypical presentation with intermittent pain for 2 months and persistent pain for 8 days.  She is afebrile with a normal white blood cell count.  However, CT scan of the abdomen and pelvis does show a dilated appendix with surrounding inflammatory changes consistent with acute appendicitis.  We discussed laparoscopic appendectomy.  We discussed the potential need for conversion to open surgery.  We discussed the possible hospitalization to be anticipated.  The patient understands and wishes to proceed with surgery.  Patient will be admitted from the emergency room to the floor.  We will await the results of her COVID-19 testing.  I will discuss timing of surgery with the operating room.  The risks and benefits of the procedure have been discussed at length with the patient.  The patient understands the proposed procedure, potential  alternative treatments, and the course of recovery to be expected.  All of the patient's questions have been answered at this time.  The patient wishes to proceed with surgery.  Sherren Mocha  Harlow Asa, [facility]: [place], MD 03/29/2019, 11:35 PM

## 2019-03-29 NOTE — ED Triage Notes (Signed)
Per pt, states she has been having left lower abdominal pain for 2 months-states she thought is was related to her menstrual cycle but it ended on Tuesday and she is still having pain

## 2019-03-29 NOTE — ED Provider Notes (Signed)
[facility] DEPT Provider Note   CSN: [id number] Arrival date & time: 03/29/19  1415     History   Chief Complaint Chief Complaint  Patient presents with  . Abdominal Pain    HPI Brooke Marks is a 38 y.o. female.     HPI  For 2 months has been having abdominal pain, will be about 3 times daily, when on menses it is once an hour.  Not worse with eating or drinking. When pain comes it lasts less than a minute then improves.  Some days will not have the pain at all.   Over the last week, it has been worse. Sharp pains around belly button and pain in RLQ, feel chills all week Constipated, took a laxative, Thursday and Friday night, felt a little better after using the bathroom but when woke up in the AM the pain was still there.  Still having episodes of pain.  Associated nausea associated with the pain  Appetite is ok No fever No dysuria/urinary symptoms No vaginal discharge or bleeding/dysparunia     Past Medical History:  Diagnosis Date  . Anxiety   . Chlamydia   . Depression   . Diabetes mellitus without complication ([place])    Gestational  . Hypertension   . PIH (pregnancy induced hypertension) 01/27/2016  . S/P cesarean section 01/27/2016    Patient Active Problem List   Diagnosis Date Noted  . Appendicitis, acute 03/29/2019  . Acute appendicitis 03/29/2019  . Gestational diabetes mellitus 12/17/2018  . Breast pain 12/17/2018  . Hypertension 01/27/2016  . S/P cesarean section 01/27/2016  . Anxiety state 04/13/2015  . PMDD (premenstrual dysphoric disorder) 12/25/2014  . Annual physical exam 12/25/2014  . GERD (gastroesophageal reflux disease) 02/16/2014  . 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 CARPAL TUNNEL SYNDROME, LEFT 04/11/2010  . CHEST PAIN, ATYPICAL 04/11/2010  . HERPES SIMPLEX INFECTION, RECURRENT 02/26/2009  . OBESITY, NOS 07/19/2006    Past Surgical History:  Procedure Laterality Date  . CESAREAN  SECTION    . CESAREAN SECTION N/A 01/27/2016   Procedure: CESAREAN SECTION;  Surgeon: Janyth Contes, MD;  Location: [facility];  Service: Obstetrics;  Laterality: N/A;  MD requests RNFA  . WISDOM TOOTH EXTRACTION       OB History    Gravida  3   Para  1   Term  1   Preterm  0   AB  2   Living  1     SAB  0   TAB  2   Ectopic  0   Multiple      Live Births  1            Home Medications    Prior to Admission medications   Medication Sig Start Date End Date Taking? Authorizing Provider  amLODipine (NORVASC) 10 MG tablet Take 1 tablet (10 mg total) by mouth daily. 03/27/18  Yes Bufford Lope, DO  omeprazole (PRILOSEC) 20 MG capsule Take 1 capsule (20 mg total) by mouth daily. Patient not taking: Reported on 03/29/2019 03/27/18   Bufford Lope, DO    Family History Family History  Problem Relation Age of Onset  . Diabetes Mother   . Hypertension Father     Social History Social History   Tobacco Use  . Smoking status: Never Smoker  . Smokeless tobacco: Never Used  Substance Use Topics  . Alcohol use: No    Frequency: Never  . Drug use:  No     Allergies   Patient has no known allergies.   Review of Systems Review of Systems  Constitutional: Negative for activity change and fever.  Respiratory: Negative for cough and shortness of breath.   Cardiovascular: Negative for chest pain.  Gastrointestinal: Positive for abdominal pain and constipation. Negative for diarrhea, nausea and vomiting.  Genitourinary: Negative for dysuria, vaginal bleeding and vaginal discharge.  Skin: Negative for rash.  Neurological: Negative for syncope.     Physical Exam Updated Vital Signs BP 103/64 (BP Location: Right Arm)   Pulse 78   Temp 97.7 F (36.5 C) (Oral)   Resp 18   Ht 5\' 2"  (1.575 m)   Wt 84.4 kg   LMP 03/25/2019   SpO2 100%   BMI 34.03 kg/m   Physical Exam Vitals signs and nursing note reviewed.  Constitutional:      General: She is  not in acute distress.    Appearance: She is well-developed. She is not diaphoretic.  HENT:     Head: Normocephalic and atraumatic.  Eyes:     Conjunctiva/sclera: Conjunctivae normal.  Neck:     Musculoskeletal: Normal range of motion.  Cardiovascular:     Rate and Rhythm: Normal rate and regular rhythm.     Heart sounds: Normal heart sounds. No murmur. No friction rub. No gallop.   Pulmonary:     Effort: Pulmonary effort is normal. No respiratory distress.     Breath sounds: Normal breath sounds. No wheezing or rales.  Abdominal:     General: There is no distension.     Palpations: Abdomen is soft.     Tenderness: There is abdominal tenderness in the right lower quadrant. There is no guarding. Positive signs include McBurney's sign.  Musculoskeletal:        General: No tenderness.  Skin:    General: Skin is warm and dry.     Findings: No erythema or rash.  Neurological:     Mental Status: She is alert and oriented to person, place, and time.      ED Treatments / Results  Labs (all labs ordered are listed, but only abnormal results are displayed) Labs Reviewed  COMPREHENSIVE METABOLIC PANEL - Abnormal; Notable for the following components:      Result Value   Potassium 3.4 (*)    Calcium 8.6 (*)    Total Protein 8.6 (*)    All other components within normal limits  CBC - Abnormal; Notable for the following components:   MCV 79.6 (*)    MCH 24.6 (*)    All other components within normal limits  URINALYSIS, ROUTINE W REFLEX MICROSCOPIC - Abnormal; Notable for the following components:   APPearance HAZY (*)    Ketones, ur 20 (*)    All other components within normal limits  SARS CORONAVIRUS 2 (TAT 6-24 HRS)  LIPASE, BLOOD  HIV ANTIBODY (ROUTINE TESTING W REFLEX)  I-STAT BETA HCG BLOOD, ED ([facility], [facility], [facility] ONLY)    EKG None  Radiology Ct Abdomen Pelvis W Contrast  Result Date: 03/29/2019 CLINICAL DATA:  Abdominal pain.  Appendicitis suspected. EXAM: CT ABDOMEN AND  PELVIS WITH CONTRAST TECHNIQUE: Multidetector CT imaging of the abdomen and pelvis was performed using the standard protocol following bolus administration of intravenous contrast. CONTRAST:  152mL OMNIPAQUE IOHEXOL 300 MG/ML  SOLN COMPARISON:  None. FINDINGS: Lower chest: The lung bases are clear. The heart size is normal. Hepatobiliary: The liver is normal. Normal gallbladder.There is no biliary ductal dilation.  Pancreas: Normal contours without ductal dilatation. No peripancreatic fluid collection. Spleen: There is a slightly complex 4.2 cm hypoattenuating mass in the lower pole the spleen. The margins are irregular, especially on the coronal series. Adrenals/Urinary Tract: --Adrenal glands: No adrenal hemorrhage. --Right kidney/ureter: No hydronephrosis or perinephric hematoma. --Left kidney/ureter: No hydronephrosis or perinephric hematoma. --Urinary bladder: Unremarkable. Stomach/Bowel: --Stomach/Duodenum: No hiatal hernia or other gastric abnormality. Normal duodenal course and caliber. --Small bowel: No dilatation or inflammation. --Colon: No focal abnormality. --Appendix: The appendix is significantly dilated. There are extensive inflammatory changes in the right lower quadrant. There is no free air. There is a small amount of reactive free fluid in the pelvis. Vascular/Lymphatic: Normal course and caliber of the major abdominal vessels. --No retroperitoneal lymphadenopathy. --No mesenteric lymphadenopathy. --No pelvic or inguinal lymphadenopathy. Reproductive: Unremarkable Other: No ascites or free air. The abdominal wall is normal. Musculoskeletal. No acute displaced fractures. IMPRESSION: 1. Extensive inflammatory changes in the right lower quadrant with a significantly dilated appendix. Findings are consistent with acute appendicitis. While there is no free air, perforation is not entirely excluded. There is no abscess. 2. Small amount of reactive free fluid in the pelvis. 3. Complex cystic lesion in  the spleen as detailed above. This is essentially stable from prior study in 2019 and is favored to represent a benign process. However, it remains indeterminate. Follow-up with a nonemergent outpatient MRI is recommended for further evaluation of this finding. Electronically Signed   By: Constance Holster M.D.   On: 03/29/2019 22:42    Procedures Procedures (including critical care time)  Medications Ordered in ED Medications  dextrose 5 % and 0.45 % NaCl with KCl 20 mEq/L infusion ( Intravenous New Bag/Given 03/30/19 0121)  cefTRIAXone (ROCEPHIN) 2 g in sodium chloride 0.9 % 100 mL IVPB ( Intravenous Automatically Held 04/07/19 0600)    And  metroNIDAZOLE (FLAGYL) IVPB 500 mg ( Intravenous Automatically Held 04/07/19 1600)  acetaminophen (TYLENOL) tablet 650 mg ( Oral MAR Hold 03/30/19 1032)    Or  acetaminophen (TYLENOL) suppository 650 mg ( Rectal MAR Hold 03/30/19 1032)  HYDROcodone-acetaminophen (NORCO/VICODIN) 5-325 MG per tablet 1-2 tablet ( Oral MAR Hold 03/30/19 1032)  HYDROmorphone (DILAUDID) injection 1 mg ( Intravenous MAR Hold 03/30/19 1032)  ondansetron (ZOFRAN-ODT) disintegrating tablet 4 mg ( Oral MAR Hold 03/30/19 1032)    Or  ondansetron (ZOFRAN) injection 4 mg ( Intravenous MAR Hold 03/30/19 1032)  sodium chloride 0.9 % with cefTRIAXone (ROCEPHIN) ADS Med (has no administration in time range)  clindamycin (CLEOCIN) 900 mg, gentamicin (GARAMYCIN) 240 mg in sodium chloride 0.9 % 1,000 mL for intraperitoneal lavage (has no administration in time range)  bupivacaine liposome (EXPAREL) 1.3 % injection 266 mg (has no administration in time range)  0.9 % irrigation (POUR BTL) (1,000 mLs Irrigation Given 03/30/19 1331)  lactated ringers irrigation solution (1,000 mLs Irrigation Given 03/30/19 1331)  iohexol (OMNIPAQUE) 300 MG/ML solution 100 mL (100 mLs Intravenous Contrast Given 03/29/19 2221)  piperacillin-tazobactam (ZOSYN) IVPB 3.375 g (0 g Intravenous Stopped 03/30/19 0021)   succinylcholine (ANECTINE) 200 MG/10ML syringe (has no administration in time range)  lidocaine 20 MG/ML injection (has no administration in time range)  rocuronium bromide 100 MG/10ML SOSY (has no administration in time range)  dexamethasone (DECADRON) 10 MG/ML injection (has no administration in time range)  ondansetron (ZOFRAN) 4 MG/2ML injection (has no administration in time range)  propofol (DIPRIVAN) 10 mg/mL bolus/IV push (has no administration in time range)  fentaNYL (SUBLIMAZE) 250 MCG/5ML injection (has  no administration in time range)  midazolam (VERSED) 2 MG/2ML injection (has no administration in time range)  bupivacaine-EPINEPHrine (MARCAINE W/ EPI) 0.25% -1:200000 (with pres) injection (has no administration in time range)  fentaNYL (SUBLIMAZE) 250 MCG/5ML injection (has no administration in time range)  albumin human 5 % solution (has no administration in time range)  phenylephrine 0.4-0.9 MG/10ML-% injection (has no administration in time range)  phenylephrine 0.4-0.9 MG/10ML-% injection (has no administration in time range)     Initial Impression / Assessment and Plan / ED Course  I have reviewed the triage vital signs and the nursing notes.  Pertinent labs & imaging results that were available during my care of the patient were reviewed by me and considered in my medical decision making (see chart for details).        38 year old female with history of hypertension presents with 2 months of intermittent right lower quadrant pain, however worsening in the last week. Pregnancy test negative, urinalysis without signs of infection, and lab work unremarkable.  She has atypical history for appendicitis, but does have significant right lower quadrant tenderness on exam, and given increased symptoms within the last week and associated chills, ordered CT abdomen pelvis to evaluate for appendicitis.  CT shows extensive inflammatory changes in the right lower quadrant with a  significantly dilated appendix, consistent with acute appendicitis, while there is no free air, perforation is not entirely excluded.  Incidental splenic cyst stable from prior CT, favored benign but recommend outpt MRI with PCP which has been placed in pt discharge instructions.   She is hemodynamically stable in the ED. NO signs of sepsis. Ordered abx and consulted Dr. Harlow Asa of General Surgery who will evaluate patient.    Final Clinical Impressions(s) / ED Diagnoses   Final diagnoses:  Appendicitis, acute    ED Discharge Orders    None       Gareth Morgan, MD 03/30/19 1345

## 2019-03-29 NOTE — ED Notes (Signed)
Provider at bedside

## 2019-03-29 NOTE — ED Notes (Addendum)
Pt transported to CT ?

## 2019-03-30 ENCOUNTER — Observation Stay (HOSPITAL_COMMUNITY): Payer: 59 | Admitting: Certified Registered Nurse Anesthetist

## 2019-03-30 ENCOUNTER — Encounter (HOSPITAL_COMMUNITY): Admission: EM | Disposition: A | Discharge status: Home / Self Care | Payer: Self-pay | Source: Home / Self Care

## 2019-03-30 ENCOUNTER — Other Ambulatory Visit: Payer: Self-pay

## 2019-03-30 ENCOUNTER — Encounter (HOSPITAL_COMMUNITY): Payer: Self-pay | Admitting: *Deleted

## 2019-03-30 DIAGNOSIS — F3281 Premenstrual dysphoric disorder: Secondary | ICD-10-CM | POA: Diagnosis present

## 2019-03-30 DIAGNOSIS — K219 Gastro-esophageal reflux disease without esophagitis: Secondary | ICD-10-CM | POA: Diagnosis present

## 2019-03-30 DIAGNOSIS — K388 Other specified diseases of appendix: Secondary | ICD-10-CM | POA: Diagnosis present

## 2019-03-30 DIAGNOSIS — E1165 Type 2 diabetes mellitus with hyperglycemia: Secondary | ICD-10-CM | POA: Diagnosis present

## 2019-03-30 DIAGNOSIS — R109 Unspecified abdominal pain: Secondary | ICD-10-CM | POA: Diagnosis present

## 2019-03-30 DIAGNOSIS — Z8249 Family history of ischemic heart disease and other diseases of the circulatory system: Secondary | ICD-10-CM | POA: Diagnosis not present

## 2019-03-30 DIAGNOSIS — Z98891 History of uterine scar from previous surgery: Secondary | ICD-10-CM | POA: Diagnosis not present

## 2019-03-30 DIAGNOSIS — Z833 Family history of diabetes mellitus: Secondary | ICD-10-CM | POA: Diagnosis not present

## 2019-03-30 DIAGNOSIS — A7481 Chlamydial peritonitis: Secondary | ICD-10-CM | POA: Diagnosis present

## 2019-03-30 DIAGNOSIS — K3533 Acute appendicitis with perforation and localized peritonitis, with abscess: Secondary | ICD-10-CM | POA: Diagnosis present

## 2019-03-30 DIAGNOSIS — F329 Major depressive disorder, single episode, unspecified: Secondary | ICD-10-CM | POA: Diagnosis present

## 2019-03-30 DIAGNOSIS — K5900 Constipation, unspecified: Secondary | ICD-10-CM | POA: Diagnosis present

## 2019-03-30 DIAGNOSIS — Z8632 Personal history of gestational diabetes: Secondary | ICD-10-CM | POA: Diagnosis not present

## 2019-03-30 DIAGNOSIS — R739 Hyperglycemia, unspecified: Secondary | ICD-10-CM

## 2019-03-30 DIAGNOSIS — F411 Generalized anxiety disorder: Secondary | ICD-10-CM | POA: Diagnosis present

## 2019-03-30 DIAGNOSIS — Z20828 Contact with and (suspected) exposure to other viral communicable diseases: Secondary | ICD-10-CM | POA: Diagnosis present

## 2019-03-30 DIAGNOSIS — N83291 Other ovarian cyst, right side: Secondary | ICD-10-CM | POA: Diagnosis present

## 2019-03-30 DIAGNOSIS — I10 Essential (primary) hypertension: Secondary | ICD-10-CM | POA: Diagnosis present

## 2019-03-30 HISTORY — PX: LAPAROSCOPIC LYSIS OF ADHESIONS: SHX5905

## 2019-03-30 HISTORY — PX: COLON RESECTION: SHX5231

## 2019-03-30 LAB — GLUCOSE, CAPILLARY
Glucose-Capillary: 188 mg/dL — ABNORMAL HIGH (ref 70–99)
Glucose-Capillary: 193 mg/dL — ABNORMAL HIGH (ref 70–99)

## 2019-03-30 LAB — SARS CORONAVIRUS 2 (TAT 6-24 HRS): SARS Coronavirus 2: NEGATIVE

## 2019-03-30 SURGERY — LAPAROSCOPIC RIGHT COLON RESECTION
Anesthesia: General | Site: Abdomen

## 2019-03-30 MED ORDER — ONDANSETRON HCL 4 MG/2ML IJ SOLN
INTRAMUSCULAR | Status: DC | PRN
  Administered 2019-03-30: 4 mg via INTRAVENOUS

## 2019-03-30 MED ORDER — SUGAMMADEX SODIUM 200 MG/2ML IV SOLN
INTRAVENOUS | Status: DC | PRN
  Administered 2019-03-30: 200 mg via INTRAVENOUS

## 2019-03-30 MED ORDER — BUPIVACAINE LIPOSOME 1.3 % IJ SUSP
20.0000 mL | Freq: Once | INTRAMUSCULAR | Status: AC
  Administered 2019-03-30: 20 mL
  Filled 2019-03-30: qty 20

## 2019-03-30 MED ORDER — FENTANYL CITRATE (PF) 250 MCG/5ML IJ SOLN
INTRAMUSCULAR | Status: AC
  Filled 2019-03-30: qty 5

## 2019-03-30 MED ORDER — PHENYLEPHRINE 40 MCG/ML (10ML) SYRINGE FOR IV PUSH (FOR BLOOD PRESSURE SUPPORT)
PREFILLED_SYRINGE | INTRAVENOUS | Status: AC
  Filled 2019-03-30: qty 10

## 2019-03-30 MED ORDER — 0.9 % SODIUM CHLORIDE (POUR BTL) OPTIME
TOPICAL | Status: DC | PRN
  Administered 2019-03-30: 1000 mL

## 2019-03-30 MED ORDER — FENTANYL CITRATE (PF) 100 MCG/2ML IJ SOLN
INTRAMUSCULAR | Status: DC | PRN
  Administered 2019-03-30 (×3): 100 ug via INTRAVENOUS
  Administered 2019-03-30 (×2): 50 ug via INTRAVENOUS

## 2019-03-30 MED ORDER — LACTATED RINGERS IV SOLN
INTRAVENOUS | Status: DC
  Administered 2019-03-30: 16:00:00 via INTRAVENOUS

## 2019-03-30 MED ORDER — LIDOCAINE 2% (20 MG/ML) 5 ML SYRINGE
INTRAMUSCULAR | Status: AC
  Filled 2019-03-30: qty 5

## 2019-03-30 MED ORDER — SODIUM CHLORIDE 0.9 % IV SOLN
INTRAVENOUS | Status: AC
  Filled 2019-03-30: qty 20

## 2019-03-30 MED ORDER — LIDOCAINE 2% (20 MG/ML) 5 ML SYRINGE
INTRAMUSCULAR | Status: DC | PRN
  Administered 2019-03-30: 25 mg via INTRAVENOUS
  Administered 2019-03-30: 75 mg via INTRAVENOUS

## 2019-03-30 MED ORDER — FENTANYL CITRATE (PF) 100 MCG/2ML IJ SOLN
INTRAMUSCULAR | Status: AC
  Filled 2019-03-30: qty 2

## 2019-03-30 MED ORDER — LABETALOL HCL 5 MG/ML IV SOLN
INTRAVENOUS | Status: AC
  Filled 2019-03-30: qty 4

## 2019-03-30 MED ORDER — LABETALOL HCL 5 MG/ML IV SOLN
INTRAVENOUS | Status: DC | PRN
  Administered 2019-03-30: 2.5 mg via INTRAVENOUS

## 2019-03-30 MED ORDER — OXYCODONE HCL 5 MG PO TABS: 5.0000 mg | ORAL_TABLET | Freq: Once | ORAL | Status: DC | PRN

## 2019-03-30 MED ORDER — PHENYLEPHRINE 40 MCG/ML (10ML) SYRINGE FOR IV PUSH (FOR BLOOD PRESSURE SUPPORT)
PREFILLED_SYRINGE | INTRAVENOUS | Status: DC | PRN
  Administered 2019-03-30: 80 ug via INTRAVENOUS
  Administered 2019-03-30: 120 ug via INTRAVENOUS

## 2019-03-30 MED ORDER — ONDANSETRON HCL 4 MG/2ML IJ SOLN: 4.0000 mg | Freq: Four times a day (QID) | INTRAMUSCULAR | Status: DC | PRN

## 2019-03-30 MED ORDER — ALBUMIN HUMAN 5 % IV SOLN
INTRAVENOUS | Status: DC | PRN
  Administered 2019-03-30 (×2): via INTRAVENOUS

## 2019-03-30 MED ORDER — FENTANYL CITRATE (PF) 100 MCG/2ML IJ SOLN
25.0000 ug | INTRAMUSCULAR | Status: DC | PRN
  Administered 2019-03-30: 25 ug via INTRAVENOUS

## 2019-03-30 MED ORDER — CHLORHEXIDINE GLUCONATE CLOTH 2 % EX PADS
6.0000 | MEDICATED_PAD | Freq: Every day | CUTANEOUS | Status: DC
  Administered 2019-04-01 – 2019-04-02 (×2): 6 via TOPICAL

## 2019-03-30 MED ORDER — PROPOFOL 10 MG/ML IV BOLUS
INTRAVENOUS | Status: AC
  Filled 2019-03-30: qty 20

## 2019-03-30 MED ORDER — ALBUMIN HUMAN 5 % IV SOLN
INTRAVENOUS | Status: AC
  Filled 2019-03-30: qty 250

## 2019-03-30 MED ORDER — ONDANSETRON HCL 4 MG/2ML IJ SOLN
INTRAMUSCULAR | Status: AC
  Filled 2019-03-30: qty 2

## 2019-03-30 MED ORDER — MIDAZOLAM HCL 5 MG/5ML IJ SOLN
INTRAMUSCULAR | Status: DC | PRN
  Administered 2019-03-30: 2 mg via INTRAVENOUS

## 2019-03-30 MED ORDER — ROCURONIUM BROMIDE 10 MG/ML (PF) SYRINGE
PREFILLED_SYRINGE | INTRAVENOUS | Status: DC | PRN
  Administered 2019-03-30: 10 mg via INTRAVENOUS
  Administered 2019-03-30: 40 mg via INTRAVENOUS
  Administered 2019-03-30 (×2): 20 mg via INTRAVENOUS

## 2019-03-30 MED ORDER — INSULIN ASPART 100 UNIT/ML ~~LOC~~ SOLN: 0.0000 [IU] | Freq: Three times a day (TID) | SUBCUTANEOUS | Status: DC

## 2019-03-30 MED ORDER — MIDAZOLAM HCL 2 MG/2ML IJ SOLN
INTRAMUSCULAR | Status: AC
  Filled 2019-03-30: qty 2

## 2019-03-30 MED ORDER — BUPIVACAINE-EPINEPHRINE 0.25% -1:200000 IJ SOLN
INTRAMUSCULAR | Status: DC | PRN
  Administered 2019-03-30: 50 mL

## 2019-03-30 MED ORDER — DEXAMETHASONE SODIUM PHOSPHATE 10 MG/ML IJ SOLN
INTRAMUSCULAR | Status: AC
  Filled 2019-03-30: qty 1

## 2019-03-30 MED ORDER — SODIUM CHLORIDE 0.9 % IV SOLN
INTRAVENOUS | Status: AC
  Administered 2019-03-30: 1000 mL
  Filled 2019-03-30: qty 6

## 2019-03-30 MED ORDER — BUPIVACAINE-EPINEPHRINE 0.25% -1:200000 IJ SOLN
INTRAMUSCULAR | Status: AC
  Filled 2019-03-30: qty 1

## 2019-03-30 MED ORDER — ROCURONIUM BROMIDE 10 MG/ML (PF) SYRINGE
PREFILLED_SYRINGE | INTRAVENOUS | Status: AC
  Filled 2019-03-30: qty 10

## 2019-03-30 MED ORDER — DEXAMETHASONE SODIUM PHOSPHATE 10 MG/ML IJ SOLN
INTRAMUSCULAR | Status: DC | PRN
  Administered 2019-03-30: 10 mg via INTRAVENOUS

## 2019-03-30 MED ORDER — PROPOFOL 10 MG/ML IV BOLUS
INTRAVENOUS | Status: DC | PRN
  Administered 2019-03-30: 30 mg via INTRAVENOUS
  Administered 2019-03-30: 170 mg via INTRAVENOUS

## 2019-03-30 MED ORDER — OXYCODONE HCL 5 MG/5ML PO SOLN: 5.0000 mg | Freq: Once | ORAL | Status: DC | PRN

## 2019-03-30 MED ORDER — GLYCOPYRROLATE PF 0.2 MG/ML IJ SOSY
PREFILLED_SYRINGE | INTRAMUSCULAR | Status: DC | PRN
  Administered 2019-03-30: .1 mg via INTRAVENOUS

## 2019-03-30 MED ORDER — LACTATED RINGERS IV SOLN
INTRAVENOUS | Status: DC | PRN
  Administered 2019-03-30 (×3): via INTRAVENOUS

## 2019-03-30 MED ORDER — SUCCINYLCHOLINE CHLORIDE 200 MG/10ML IV SOSY
PREFILLED_SYRINGE | INTRAVENOUS | Status: DC | PRN
  Administered 2019-03-30: 120 mg via INTRAVENOUS

## 2019-03-30 MED ORDER — SUCCINYLCHOLINE CHLORIDE 200 MG/10ML IV SOSY
PREFILLED_SYRINGE | INTRAVENOUS | Status: AC
  Filled 2019-03-30: qty 10

## 2019-03-30 MED ORDER — LACTATED RINGERS IR SOLN
Status: DC | PRN
  Administered 2019-03-30: 2000 mL

## 2019-03-30 MED ORDER — INSULIN ASPART 100 UNIT/ML ~~LOC~~ SOLN: 0.0000 [IU] | Freq: Every day | SUBCUTANEOUS | Status: DC

## 2019-03-30 SURGICAL SUPPLY — 55 items
APPLIER CLIP 5 13 M/L LIGAMAX5 (MISCELLANEOUS)
APPLIER CLIP ROT 10 11.4 M/L (STAPLE)
CABLE HIGH FREQUENCY MONO STRZ (ELECTRODE) ×3 IMPLANT
CHLORAPREP W/TINT 26 (MISCELLANEOUS) ×3 IMPLANT
CLIP APPLIE 5 13 M/L LIGAMAX5 (MISCELLANEOUS) IMPLANT
CLIP APPLIE ROT 10 11.4 M/L (STAPLE) IMPLANT
COVER SURGICAL LIGHT HANDLE (MISCELLANEOUS) ×3 IMPLANT
COVER WAND RF STERILE (DRAPES) IMPLANT
CUTTER FLEX LINEAR 45M (STAPLE) IMPLANT
DECANTER SPIKE VIAL GLASS SM (MISCELLANEOUS) ×3 IMPLANT
DEVICE TROCAR PUNCTURE CLOSURE (ENDOMECHANICALS) IMPLANT
DRAPE WARM FLUID 44X44 (DRAPES) ×3 IMPLANT
DRSG OPSITE POSTOP 4X6 (GAUZE/BANDAGES/DRESSINGS) ×3 IMPLANT
DRSG TEGADERM 2-3/8X2-3/4 SM (GAUZE/BANDAGES/DRESSINGS) ×3 IMPLANT
DRSG TEGADERM 4X4.75 (GAUZE/BANDAGES/DRESSINGS) ×3 IMPLANT
ELECT REM PT RETURN 15FT ADLT (MISCELLANEOUS) ×3 IMPLANT
ENDOLOOP SUT PDS II  0 18 (SUTURE)
ENDOLOOP SUT PDS II 0 18 (SUTURE) IMPLANT
GAUZE SPONGE 2X2 8PLY STRL LF (GAUZE/BANDAGES/DRESSINGS) ×2 IMPLANT
GLOVE ECLIPSE 8.0 STRL XLNG CF (GLOVE) ×3 IMPLANT
GLOVE INDICATOR 8.0 STRL GRN (GLOVE) ×3 IMPLANT
GOWN STRL REUS W/TWL XL LVL3 (GOWN DISPOSABLE) ×9 IMPLANT
HANDLE SUCTION POOLE (INSTRUMENTS) ×2 IMPLANT
IRRIG SUCT STRYKERFLOW 2 WTIP (MISCELLANEOUS) ×3
IRRIGATION SUCT STRKRFLW 2 WTP (MISCELLANEOUS) ×2 IMPLANT
KIT BASIN OR (CUSTOM PROCEDURE TRAY) ×3 IMPLANT
KIT TURNOVER KIT A (KITS) ×3 IMPLANT
PAD POSITIONING PINK XL (MISCELLANEOUS) ×3 IMPLANT
PORT LAP GEL ALEXIS MED 5-9CM (MISCELLANEOUS) ×3 IMPLANT
POUCH RETRIEVAL ECOSAC 10 (ENDOMECHANICALS) ×2 IMPLANT
POUCH RETRIEVAL ECOSAC 10MM (ENDOMECHANICALS) ×1
RELOAD 45 VASCULAR/THIN (ENDOMECHANICALS) IMPLANT
RELOAD STAPLE TA45 3.5 REG BLU (ENDOMECHANICALS) IMPLANT
RELOAD STAPLER BLUE 60MM (STAPLE) ×8 IMPLANT
SCISSORS LAP 5X35 DISP (ENDOMECHANICALS) ×3 IMPLANT
SEALER TISSUE G2 CVD JAW 45CM (ENDOMECHANICALS) ×3 IMPLANT
SET TUBE SMOKE EVAC HIGH FLOW (TUBING) ×3 IMPLANT
SHEARS HARMONIC ACE PLUS 36CM (ENDOMECHANICALS) ×3 IMPLANT
SLEEVE XCEL OPT CAN 5 100 (ENDOMECHANICALS) ×3 IMPLANT
SPONGE GAUZE 2X2 STER 10/PKG (GAUZE/BANDAGES/DRESSINGS) ×1
STAPLER ECHELON LONG 60 440 (INSTRUMENTS) ×3 IMPLANT
STAPLER RELOAD BLUE 60MM (STAPLE) ×12
SUCTION POOLE HANDLE (INSTRUMENTS) ×3
SUT MNCRL AB 4-0 PS2 18 (SUTURE) ×3 IMPLANT
SUT PDS AB 0 CT1 36 (SUTURE) IMPLANT
SUT PDS AB 1 CT1 27 (SUTURE) ×6 IMPLANT
SUT SILK 2 0 SH (SUTURE) IMPLANT
SUT VIC AB 2-0 SH 27 (SUTURE) ×1
SUT VIC AB 2-0 SH 27X BRD (SUTURE) ×2 IMPLANT
TOWEL OR 17X26 10 PK STRL BLUE (TOWEL DISPOSABLE) ×3 IMPLANT
TRAY FOLEY MTR SLVR 14FR STAT (SET/KITS/TRAYS/PACK) ×3 IMPLANT
TRAY LAPAROSCOPIC (CUSTOM PROCEDURE TRAY) ×3 IMPLANT
TROCAR BLADELESS OPT 5 100 (ENDOMECHANICALS) ×3 IMPLANT
TROCAR XCEL 12X100 BLDLESS (ENDOMECHANICALS) ×3 IMPLANT
YANKAUER SUCT BULB TIP 10FT TU (MISCELLANEOUS) ×3 IMPLANT

## 2019-03-30 NOTE — Interval H&P Note (Signed)
History and Physical Interval Note:  03/30/2019 11:53 AM  Brooke Marks  has presented today for surgery, with the diagnosis of acute appendicitis.  The various methods of treatment have been discussed with the patient and family. After consideration of risks, benefits and other options for treatment, the patient has consented to  Procedure(s): APPENDECTOMY LAPAROSCOPIC (N/A) as a surgical intervention.  The patient's history has been reviewed, patient examined, no change in status, stable for surgery.  I have reviewed the patient's chart and labs.  Questions were answered to the patient's satisfaction.    I have re-reviewed the the patient's records, history, medications, and allergies.  I have re-examined the patient.  I again discussed intraoperative plans and goals of post-operative recovery.  The patient agrees to proceed.  Brooke Marks  [birth year] [id number]  Patient Care Team: Kathrene Alu, MD as PCP - General (Family Medicine) Shan Levans Alcario Drought, MD (Family Medicine)  Patient Active Problem List   Diagnosis Date Noted  . Appendicitis, acute 03/29/2019  . Acute appendicitis 03/29/2019  . Gestational diabetes mellitus 12/17/2018  . Breast pain 12/17/2018  . Hypertension 01/27/2016  . S/P cesarean section 01/27/2016  . Anxiety state 04/13/2015  . PMDD (premenstrual dysphoric disorder) 12/25/2014  . Annual physical exam 12/25/2014  . GERD (gastroesophageal reflux disease) 02/16/2014  . 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 CARPAL TUNNEL SYNDROME, LEFT 04/11/2010  . CHEST PAIN, ATYPICAL 04/11/2010  . HERPES SIMPLEX INFECTION, RECURRENT 02/26/2009  . OBESITY, NOS 07/19/2006    Past Medical History:  Diagnosis Date  . Anxiety   . Chlamydia   . Depression   . Diabetes mellitus without complication ([place])    Gestational  . Hypertension   . PIH (pregnancy induced hypertension) 01/27/2016  . S/P cesarean section 01/27/2016    Past Surgical History:   Procedure Laterality Date  . CESAREAN SECTION    . CESAREAN SECTION N/A 01/27/2016   Procedure: CESAREAN SECTION;  Surgeon: Janyth Contes, MD;  Location: [facility];  Service: Obstetrics;  Laterality: N/A;  MD requests RNFA  . WISDOM TOOTH EXTRACTION      Social History   Socioeconomic History  . Marital status: Single    Spouse name: Not on file  . Number of children: Not on file  . Years of education: Not on file  . Highest education level: Not on file  Occupational History  . Not on file  Social Needs  . Financial resource strain: Not on file  . Food insecurity    Worry: Not on file    Inability: Not on file  . Transportation needs    Medical: Not on file    Non-medical: Not on file  Tobacco Use  . Smoking status: Never Smoker  . Smokeless tobacco: Never Used  Substance and Sexual Activity  . Alcohol use: No    Frequency: Never  . Drug use: No  . Sexual activity: Yes    Birth control/protection: None  Lifestyle  . Physical activity    Days per week: Not on file    Minutes per session: Not on file  . Stress: Not on file  Relationships  . Social Herbalist on phone: Not on file    Gets together: Not on file    Attends religious service: Not on file    Active member of club or organization: Not on file    Attends meetings of clubs or organizations: Not on file    Relationship  status: Not on file  . Intimate partner violence    Fear of current or ex partner: Not on file    Emotionally abused: Not on file    Physically abused: Not on file    Forced sexual activity: Not on file  Other Topics Concern  . Not on file  Social History Narrative   ** Merged History Encounter **        Family History  Problem Relation Age of Onset  . Diabetes Mother   . Hypertension Father     Medications Prior to Admission  Medication Sig Dispense Refill Last Dose  . amLODipine (NORVASC) 10 MG tablet Take 1 tablet (10 mg total) by mouth daily. 90  tablet 3 03/28/2019 at Unknown time  . omeprazole (PRILOSEC) 20 MG capsule Take 1 capsule (20 mg total) by mouth daily. (Patient not taking: Reported on 03/29/2019) 90 capsule 3 Not Taking at Unknown time    Current Facility-Administered Medications  Medication Dose Route Frequency Provider Last Rate Last Dose  . [MAR Hold] acetaminophen (TYLENOL) tablet 650 mg  650 mg Oral Q6H PRN Armandina Gemma, MD       Or  . Doug Sou Hold] acetaminophen (TYLENOL) suppository 650 mg  650 mg Rectal Q6H PRN Armandina Gemma, MD      . Doug Sou Hold] cefTRIAXone (ROCEPHIN) 2 g in sodium chloride 0.9 % 100 mL IVPB  2 g Intravenous Q0600 Armandina Gemma, MD       And  . Doug Sou Hold] metroNIDAZOLE (FLAGYL) IVPB 500 mg  500 mg Intravenous Margaretha Glassing, MD 100 mL/hr at 03/30/19 0855 500 mg at 03/30/19 0855  . dextrose 5 % and 0.45 % NaCl with KCl 20 mEq/L infusion   Intravenous Continuous Armandina Gemma, MD 100 mL/hr at 03/30/19 0121    . [MAR Hold] HYDROcodone-acetaminophen (NORCO/VICODIN) 5-325 MG per tablet 1-2 tablet  1-2 tablet Oral Q4H PRN Armandina Gemma, MD      . Doug Sou Hold] HYDROmorphone (DILAUDID) injection 1 mg  1 mg Intravenous Q2H PRN Armandina Gemma, MD      . Doug Sou Hold] ondansetron (ZOFRAN-ODT) disintegrating tablet 4 mg  4 mg Oral Q6H PRN Armandina Gemma, MD   4 mg at 03/30/19 0013   Or  . [MAR Hold] ondansetron (ZOFRAN) injection 4 mg  4 mg Intravenous Q6H PRN Armandina Gemma, MD         No Known Allergies  BP 103/64 (BP Location: Right Arm)   Pulse 78   Temp 97.7 F (36.5 C) (Oral)   Resp 18   Ht 5\' 2"  (1.575 m)   Wt 84.4 kg   LMP 03/25/2019   SpO2 100%   BMI 34.03 kg/m   Labs: Results for orders placed or performed during the hospital encounter of 03/29/19 (from the past 48 hour(s))  Lipase, blood     Status: None   Collection Time: 03/29/19  3:37 PM  Result Value Ref Range   Lipase 25 11 - 51 U/L    Comment: Performed at [facility], [place] [address] [place], [place] [address]   Comprehensive metabolic panel     Status: Abnormal   Collection Time: 03/29/19  3:37 PM  Result Value Ref Range   Sodium 136 135 - 145 mmol/L   Potassium 3.4 (L) 3.5 - 5.1 mmol/L   Chloride 102 98 - 111 mmol/L   CO2 24 22 - 32 mmol/L   Glucose, Bld 85 70 - 99 mg/dL   BUN 11 6 -  20 mg/dL   Creatinine, Ser 0.56 0.44 - 1.00 mg/dL   Calcium 8.6 (L) 8.9 - 10.3 mg/dL   Total Protein 8.6 (H) 6.5 - 8.1 g/dL   Albumin 4.0 3.5 - 5.0 g/dL   AST 15 15 - 41 U/L   ALT 12 0 - 44 U/L   Alkaline Phosphatase 57 38 - 126 U/L   Total Bilirubin 0.8 0.3 - 1.2 mg/dL   GFR calc non Af Amer >60 >60 mL/min   GFR calc Af Amer >60 >60 mL/min   Anion gap 10 5 - 15    Comment: Performed at [facility], [place] [address], [place], [place] [address]  CBC     Status: Abnormal   Collection Time: 03/29/19  3:37 PM  Result Value Ref Range   WBC 7.6 4.0 - 10.5 K/uL   RBC 5.04 3.87 - 5.11 MIL/uL   Hemoglobin 12.4 12.0 - 15.0 g/dL   HCT 40.1 36.0 - 46.0 %   MCV 79.6 (L) 80.0 - 100.0 fL   MCH 24.6 (L) 26.0 - 34.0 pg   MCHC 30.9 30.0 - 36.0 g/dL   RDW 15.1 11.5 - 15.5 %   Platelets 393 150 - 400 K/uL   nRBC 0.0 0.0 - 0.2 %    Comment: Performed at [facility], [place] [address] [place], [place] [address]  Urinalysis, Routine w reflex microscopic     Status: Abnormal   Collection Time: 03/29/19  3:37 PM  Result Value Ref Range   Color, Urine YELLOW YELLOW   APPearance HAZY (A) CLEAR   Specific Gravity, Urine 1.018 1.005 - 1.030   pH 6.0 5.0 - 8.0   Glucose, UA NEGATIVE NEGATIVE mg/dL   Hgb urine dipstick NEGATIVE NEGATIVE   Bilirubin Urine NEGATIVE NEGATIVE   Ketones, ur 20 (A) NEGATIVE mg/dL   Protein, ur NEGATIVE NEGATIVE mg/dL   Nitrite NEGATIVE NEGATIVE   Leukocytes,Ua NEGATIVE NEGATIVE    Comment: Performed at [facility] [address], [place], [place] [address]  I-Stat beta hCG blood, ED     Status: None   Collection Time: 03/29/19  3:41 PM   Result Value Ref Range   I-stat hCG, quantitative <5.0 <5 mIU/mL   Comment 3            Comment:   GEST. AGE      CONC.  (mIU/mL)   <=1 WEEK        5 - 50     2 WEEKS       50 - 500     3 WEEKS       100 - 10,000     4 WEEKS     1,000 - 30,000        FEMALE AND NON-PREGNANT FEMALE:     LESS THAN 5 mIU/mL   SARS CORONAVIRUS 2 (TAT 6-24 HRS) Nasopharyngeal Nasopharyngeal Swab     Status: None   Collection Time: 03/30/19 12:26 AM   Specimen: Nasopharyngeal Swab  Result Value Ref Range   SARS Coronavirus 2 NEGATIVE NEGATIVE    Comment: (NOTE) SARS-CoV-2 target nucleic acids are NOT DETECTED. The SARS-CoV-2 RNA is generally detectable in upper and lower respiratory specimens during the acute phase of infection. Negative results do not preclude SARS-CoV-2 infection, do not rule out co-infections with other pathogens, and should not be used as the sole basis for treatment or other patient management decisions. Negative results must be combined with clinical observations, patient  history, and epidemiological information. The expected result is Negative. Fact Sheet for Patients: SugarRoll.be Fact Sheet for Healthcare Providers: https://www.woods-mathews.com/ This test is not yet approved or cleared by the [place] FDA and  has been authorized for detection and/or diagnosis of SARS-CoV-2 by FDA under an Emergency Use Authorization (EUA). This EUA will remain  in effect (meaning this test can be used) for the duration of the COVID-19 declaration under Section 56 4(b)(1) of the Act, 21 U.S.C. section 360bbb-3(b)(1), unless the authorization is terminated or revoked sooner. Performed at [facility], [place] [address] [place], [place] [address]     Imaging / Studies: Ct Abdomen Pelvis W Contrast  Result Date: 03/29/2019 CLINICAL DATA:  Abdominal pain.  Appendicitis suspected. EXAM: CT ABDOMEN AND PELVIS WITH CONTRAST TECHNIQUE:  Multidetector CT imaging of the abdomen and pelvis was performed using the standard protocol following bolus administration of intravenous contrast. CONTRAST:  147mL OMNIPAQUE IOHEXOL 300 MG/ML  SOLN COMPARISON:  None. FINDINGS: Lower chest: The lung bases are clear. The heart size is normal. Hepatobiliary: The liver is normal. Normal gallbladder.There is no biliary ductal dilation. Pancreas: Normal contours without ductal dilatation. No peripancreatic fluid collection. Spleen: There is a slightly complex 4.2 cm hypoattenuating mass in the lower pole the spleen. The margins are irregular, especially on the coronal series. Adrenals/Urinary Tract: --Adrenal glands: No adrenal hemorrhage. --Right kidney/ureter: No hydronephrosis or perinephric hematoma. --Left kidney/ureter: No hydronephrosis or perinephric hematoma. --Urinary bladder: Unremarkable. Stomach/Bowel: --Stomach/Duodenum: No hiatal hernia or other gastric abnormality. Normal duodenal course and caliber. --Small bowel: No dilatation or inflammation. --Colon: No focal abnormality. --Appendix: The appendix is significantly dilated. There are extensive inflammatory changes in the right lower quadrant. There is no free air. There is a small amount of reactive free fluid in the pelvis. Vascular/Lymphatic: Normal course and caliber of the major abdominal vessels. --No retroperitoneal lymphadenopathy. --No mesenteric lymphadenopathy. --No pelvic or inguinal lymphadenopathy. Reproductive: Unremarkable Other: No ascites or free air. The abdominal wall is normal. Musculoskeletal. No acute displaced fractures. IMPRESSION: 1. Extensive inflammatory changes in the right lower quadrant with a significantly dilated appendix. Findings are consistent with acute appendicitis. While there is no free air, perforation is not entirely excluded. There is no abscess. 2. Small amount of reactive free fluid in the pelvis. 3. 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 This  is essentially stable from prior study in 2019 and is favored to represent a benign process. However, it remains indeterminate. Follow-up with a nonemergent outpatient MRI is recommended for further evaluation of this finding. Electronically Signed   By: Constance Holster M.D.   On: 03/29/2019 22:42     .Adin Hector, M.D., F.A.C.S. Gastrointestinal and Minimally Invasive Surgery [facility] [address], [place], [place] [address] [phone] Main / Paging  03/30/2019 11:53 AM     Adin Hector

## 2019-03-30 NOTE — ED Notes (Signed)
ED TO INPATIENT HANDOFF REPORT  ED Nurse Name and Phone #:  Anderson Malta [phone] S Name/Age/Gender Brooke Marks 38 y.o. female Room/Bed: WA21/WA21  Code Status   Code Status: Full Code  Home/SNF/Other Home Patient oriented to: self, place, time and situation Is this baseline? Yes   Triage Complete: Triage complete  Chief Complaint sharp abd pain, dizziness  Triage Note Per pt, states she has been having left lower abdominal pain for 2 months-states she thought is was related to her menstrual cycle but it ended on Tuesday and she is still having pain   Allergies No Known Allergies  Level of Care/Admitting Diagnosis ED Disposition    ED Disposition Condition [place]: [place] [[id number]]  Level of Care: Med-Surg [16]  Covid Evaluation: Asymptomatic Screening Protocol (No Symptoms)  Diagnosis: Acute appendicitis [id number]  Admitting Physician: CCS, [place]  Attending Physician: CCS, MD [[id number]  PT Class (Do Not Modify): Observation [104]  PT Acc Code (Do Not Modify): Observation [10022]       B Medical/Surgery History Past Medical History:  Diagnosis Date  . Anxiety   . Chlamydia   . Depression   . Diabetes mellitus without complication ([place])    Gestational  . Hypertension   . PIH (pregnancy induced hypertension) 01/27/2016  . S/P cesarean section 01/27/2016   Past Surgical History:  Procedure Laterality Date  . CESAREAN SECTION    . CESAREAN SECTION N/A 01/27/2016   Procedure: CESAREAN SECTION;  Surgeon: Janyth Contes, MD;  Location: [facility];  Service: Obstetrics;  Laterality: N/A;  MD requests RNFA  . WISDOM TOOTH EXTRACTION       A IV Location/Drains/Wounds Patient Lines/Drains/Airways Status   Active Line/Drains/Airways    Name:   Placement date:   Placement time:   Site:   Days:   Peripheral IV 03/29/19 Right Antecubital   03/29/19    2029    Antecubital   1   Urethral Catheter V Rogers RN   Latex 14 Fr.   01/27/16    2050    Latex   1158   Incision (Closed) 01/27/16 Abdomen Other (Comment)   01/27/16    2140     1158   Incision (Closed) 01/27/16 Vagina Other (Comment)   01/27/16    2140     1158          Intake/Output Last 24 hours  Intake/Output Summary (Last 24 hours) at 03/30/2019 0123 Last data filed at 03/30/2019 0021 Gross per 24 hour  Intake 100 ml  Output -  Net 100 ml    Labs/Imaging Results for orders placed or performed during the hospital encounter of 03/29/19 (from the past 48 hour(s))  Lipase, blood     Status: None   Collection Time: 03/29/19  3:37 PM  Result Value Ref Range   Lipase 25 11 - 51 U/L    Comment: Performed at [facility], [place] [address] [place], [place] [address]  Comprehensive metabolic panel     Status: Abnormal   Collection Time: 03/29/19  3:37 PM  Result Value Ref Range   Sodium 136 135 - 145 mmol/L   Potassium 3.4 (L) 3.5 - 5.1 mmol/L   Chloride 102 98 - 111 mmol/L   CO2 24 22 - 32 mmol/L   Glucose, Bld 85 70 - 99 mg/dL   BUN 11 6 - 20 mg/dL   Creatinine, Ser 0.56 0.44 - 1.00 mg/dL  Calcium 8.6 (L) 8.9 - 10.3 mg/dL   Total Protein 8.6 (H) 6.5 - 8.1 g/dL   Albumin 4.0 3.5 - 5.0 g/dL   AST 15 15 - 41 U/L   ALT 12 0 - 44 U/L   Alkaline Phosphatase 57 38 - 126 U/L   Total Bilirubin 0.8 0.3 - 1.2 mg/dL   GFR calc non Af Amer >60 >60 mL/min   GFR calc Af Amer >60 >60 mL/min   Anion gap 10 5 - 15    Comment: Performed at [facility], [place] [address] [place], [place] [address]  CBC     Status: Abnormal   Collection Time: 03/29/19  3:37 PM  Result Value Ref Range   WBC 7.6 4.0 - 10.5 K/uL   RBC 5.04 3.87 - 5.11 MIL/uL   Hemoglobin 12.4 12.0 - 15.0 g/dL   HCT 40.1 36.0 - 46.0 %   MCV 79.6 (L) 80.0 - 100.0 fL   MCH 24.6 (L) 26.0 - 34.0 pg   MCHC 30.9 30.0 - 36.0 g/dL   RDW 15.1 11.5 - 15.5 %   Platelets 393 150 - 400 K/uL   nRBC 0.0 0.0 - 0.2 %    Comment: Performed at [facility], [place] [address], [place], [place] [address]  Urinalysis, Routine w reflex microscopic     Status: Abnormal   Collection Time: 03/29/19  3:37 PM  Result Value Ref Range   Color, Urine YELLOW YELLOW   APPearance HAZY (A) CLEAR   Specific Gravity, Urine 1.018 1.005 - 1.030   pH 6.0 5.0 - 8.0   Glucose, UA NEGATIVE NEGATIVE mg/dL   Hgb urine dipstick NEGATIVE NEGATIVE   Bilirubin Urine NEGATIVE NEGATIVE   Ketones, ur 20 (A) NEGATIVE mg/dL   Protein, ur NEGATIVE NEGATIVE mg/dL   Nitrite NEGATIVE NEGATIVE   Leukocytes,Ua NEGATIVE NEGATIVE    Comment: Performed at [place] [address] [place], [place] [address]  I-Stat beta hCG blood, ED     Status: None   Collection Time: 03/29/19  3:41 PM  Result Value Ref Range   I-stat hCG, quantitative <5.0 <5 mIU/mL   Comment 3            Comment:   GEST. AGE      CONC.  (mIU/mL)   <=1 WEEK        5 - 50     2 WEEKS       50 - 500     3 WEEKS       100 - 10,000     4 WEEKS     1,000 - 30,000        FEMALE AND NON-PREGNANT FEMALE:     LESS THAN 5 mIU/mL    Ct Abdomen Pelvis W Contrast  Result Date: 03/29/2019 CLINICAL DATA:  Abdominal pain.  Appendicitis suspected. EXAM: CT ABDOMEN AND PELVIS WITH CONTRAST TECHNIQUE: Multidetector CT imaging of the abdomen and pelvis was performed using the standard protocol following bolus administration of intravenous contrast. CONTRAST:  182mL OMNIPAQUE IOHEXOL 300 MG/ML  SOLN COMPARISON:  None. FINDINGS: Lower chest: The lung bases are clear. The heart size is normal. Hepatobiliary: The liver is normal. Normal gallbladder.There is no biliary ductal dilation. Pancreas: Normal contours without ductal dilatation. No peripancreatic fluid collection. Spleen: There is a slightly complex 4.2 cm hypoattenuating mass in the lower pole the spleen. The margins are irregular, especially on the coronal series. Adrenals/Urinary Tract: --Adrenal glands:  No adrenal hemorrhage. --Right  kidney/ureter: No hydronephrosis or perinephric hematoma. --Left kidney/ureter: No hydronephrosis or perinephric hematoma. --Urinary bladder: Unremarkable. Stomach/Bowel: --Stomach/Duodenum: No hiatal hernia or other gastric abnormality. Normal duodenal course and caliber. --Small bowel: No dilatation or inflammation. --Colon: No focal abnormality. --Appendix: The appendix is significantly dilated. There are extensive inflammatory changes in the right lower quadrant. There is no free air. There is a small amount of reactive free fluid in the pelvis. Vascular/Lymphatic: Normal course and caliber of the major abdominal vessels. --No retroperitoneal lymphadenopathy. --No mesenteric lymphadenopathy. --No pelvic or inguinal lymphadenopathy. Reproductive: Unremarkable Other: No ascites or free air. The abdominal wall is normal. Musculoskeletal. No acute displaced fractures. IMPRESSION: 1. Extensive inflammatory changes in the right lower quadrant with a significantly dilated appendix. Findings are consistent with acute appendicitis. While there is no free air, perforation is not entirely excluded. There is no abscess. 2. Small amount of reactive free fluid in the pelvis. 3. 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 This is essentially stable from prior study in 2019 and is favored to represent a benign process. However, it remains indeterminate. Follow-up with a nonemergent outpatient MRI is recommended for further evaluation of this finding. Electronically Signed   By: Constance Holster M.D.   On: 03/29/2019 22:42    Pending Labs Unresulted Labs (From admission, onward)    Start     Ordered   03/29/19 2343  HIV Antibody (routine testing w rflx)  (HIV Antibody (Routine testing w reflex) panel)  Once,   STAT     03/29/19 2345   03/29/19 2319  SARS CORONAVIRUS 2 (TAT 6-24 HRS) Nasopharyngeal Nasopharyngeal Swab  (Symptomatic/High Risk of Exposure/Tier 1 Patients Labs with Precautions)  Once,   STAT     Comments: THIS NEEDS TO BE THE 2 HOUR TEST PER DR. Billy Fischer WITH PERMISSION FROM ID DR. HATCHER   Question Answer Comment  Is this test for diagnosis or screening Diagnosis of ill patient   Symptomatic for COVID-19 as defined by CDC No   Hospitalized for COVID-19 No   Admitted to ICU for COVID-19 No   Previously tested for COVID-19 No   Resident in a congregate (group) care setting No   Employed in healthcare setting No   Pregnant No      03/29/19 2319          Vitals/Pain Today's Vitals   03/29/19 2308 03/30/19 0000 03/30/19 0015 03/30/19 0100  BP: 126/79 117/79  114/76  Pulse: 78     Resp: 16 16  17   Temp:      TempSrc:      SpO2: 97% 96%    PainSc:   5      Isolation Precautions No active isolations  Medications Medications  dextrose 5 % and 0.45 % NaCl with KCl 20 mEq/L infusion ( Intravenous New Bag/Given 03/30/19 0121)  cefTRIAXone (ROCEPHIN) 2 g in sodium chloride 0.9 % 100 mL IVPB (has no administration in time range)    And  metroNIDAZOLE (FLAGYL) IVPB 500 mg (500 mg Intravenous Bolus from Bag 03/30/19 0017)  acetaminophen (TYLENOL) tablet 650 mg (has no administration in time range)    Or  acetaminophen (TYLENOL) suppository 650 mg (has no administration in time range)  HYDROcodone-acetaminophen (NORCO/VICODIN) 5-325 MG per tablet 1-2 tablet (1 tablet Oral Refused 03/30/19 0013)  HYDROmorphone (DILAUDID) injection 1 mg (has no administration in time range)  ondansetron (ZOFRAN-ODT) disintegrating tablet 4 mg (4 mg Oral Given 03/30/19 0013)  Or  ondansetron [facility]) injection 4 mg ( Intravenous See Alternative 03/30/19 0013)  iohexol (OMNIPAQUE) 300 MG/ML solution 100 mL (100 mLs Intravenous Contrast Given 03/29/19 2221)  piperacillin-tazobactam (ZOSYN) IVPB 3.375 g (0 g Intravenous Stopped 03/30/19 0021)    Mobility walks Low fall risk   Focused Assessments  R Recommendations: See Admitting Provider Note  Report given to:  Caren Griffins, RN

## 2019-03-30 NOTE — Progress Notes (Signed)
Patient returns to floor from PACU @ 1715. Patient is drowsy but is easily aroused. Surgical dressings are dry, clean, and intact. Ice pack present to abdomen. Foley catheter in place. Clear, yellow urine present in bag. LR @ 50 mL infusing in right antecubital IV. HOB elevated. Call bell within reach. Vital signs are within normal limits. Per PACU, ok to administer Metronidazole IVPB as ordered. Will continue to monitor.

## 2019-03-30 NOTE — Transfer of Care (Signed)
Immediate Anesthesia Transfer of Care Note  Patient: Brooke Marks  Procedure(s) Performed: LAPAROSCOPIC RIGHT COLECTOMY WITH RIGHT SALPINGO OOPHORECTOMY TAP BLOCK (N/A Abdomen) LAPAROSCOPIC LYSIS OF ADHESIONS (N/A Abdomen)  Patient Location: PACU  Anesthesia Type:General  Level of Consciousness: awake, oriented and patient cooperative  Airway & Oxygen Therapy: Patient Spontanous Breathing and Patient connected to face mask oxygen  Post-op Assessment: Report given to RN, Post -op Vital signs reviewed and stable and Patient moving all extremities X 4  Post vital signs: stable  Last Vitals:  Vitals Value Taken Time  BP 119/72 03/30/19 1545  Temp    Pulse 89 03/30/19 1550  Resp 14 03/30/19 1550  SpO2 95 % 03/30/19 1550  Vitals shown include unvalidated device data.  Last Pain:  Vitals:   03/30/19 0604  TempSrc: Oral  PainSc:       Patients Stated Pain Goal: 0 ([id number])  Complications: No apparent anesthesia complications

## 2019-03-30 NOTE — Anesthesia Preprocedure Evaluation (Signed)
Anesthesia Evaluation  Patient identified by MRN, date of birth, ID band Patient awake    Reviewed: Allergy & Precautions, H&P , NPO status , Patient's Chart, lab work & pertinent test results  Airway Mallampati: II   Neck ROM: full    Dental   Pulmonary neg pulmonary ROS,    breath sounds clear to auscultation       Cardiovascular hypertension,  Rhythm:regular Rate:Normal     Neuro/Psych PSYCHIATRIC DISORDERS Anxiety Depression  Neuromuscular disease    GI/Hepatic GERD  ,appendicitis   Endo/Other  diabetes, Type 2  Renal/GU      Musculoskeletal   Abdominal   Peds  Hematology   Anesthesia Other Findings   Reproductive/Obstetrics                             Anesthesia Physical Anesthesia Plan  ASA: II  Anesthesia Plan: General   Post-op Pain Management:    Induction: Intravenous  PONV Risk Score and Plan: 3 and Ondansetron, Dexamethasone, Midazolam and Treatment may vary due to age or medical condition  Airway Management Planned: Oral ETT  Additional Equipment:   Intra-op Plan:   Post-operative Plan: Extubation in OR  Informed Consent: I have reviewed the patients History and Physical, chart, labs and discussed the procedure including the risks, benefits and alternatives for the proposed anesthesia with the patient or authorized representative who has indicated his/her understanding and acceptance.       Plan Discussed with: CRNA, Anesthesiologist and Surgeon  Anesthesia Plan Comments:         Anesthesia Quick Evaluation

## 2019-03-30 NOTE — Op Note (Addendum)
03/30/2019  3:32 PM  PATIENT:  Brooke Marks  38 y.o. 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  PROCEDURE:   LAPAROSCOPIC RIGHT COLECTOMY with intracorporeal anastomosis RIGHT SALPINGO OOPHORECTOMY  OMENTOPEXY TAP BLOCK LAPAROSCOPIC LYSIS OF ADHESIONS  SURGEON:  Adin Hector, MD  ASSISTANT: OR Staff   ANESTHESIA:     General  Nerve block provided with liposomal bupivacaine (Experel) mixed with 0.25% bupivacaine as a Bilateral TAP block x 34mL each side at the level of the transverse abdominis & preperitoneal spaces along the flank at the anterior axillary line, from subcostal ridge to iliac crest under laparoscopic guidance   Local field block at port sites & extraction wound  EBL:  Total I/O In: 2500 [I.V.:2000; IV Piggyback:500] Out: 325 [Urine:250; Blood:75]  Delay start of Pharmacological VTE agent (>24hrs) due to surgical blood loss or risk of bleeding:  no  DRAINS: none   SPECIMEN:  1.  Distal ileum to proximal transverse colon (containing ileocecal phlegmon with appendix) 2.  Right ovary/fallopian tube with ovarian vein.  Stitch marks deep margin on ovarian vein   DISPOSITION OF SPECIMEN:  PATHOLOGY  COUNTS:  YES  PLAN OF CARE: Admit to inpatient   PATIENT DISPOSITION:  PACU - hemodynamically stable.  INDICATION:    Woman with 42-month history of intermittent right lower quadrant abdominal pain.  Worse over the past 8 days.  Became much more uncomfortable.  Can emergency room.  CT scan shows probable inflamed appendix and phlegmon.  No abscess.  Seen by Dr. Harlow Asa and then later by myself.  We recommended segmental resection:  The anatomy & physiology of the digestive tract was discussed.  The pathophysiology  of appendicitis and other appendiceal disorders were discussed.  Natural history risks without surgery was discussed.   I feel the risks of no intervention will lead to serious problems that outweigh the operative risks; therefore, I recommended diagnostic laparoscopy with removal of appendix to remove the pathology.  Laparoscopic & open techniques were discussed.   I noted a good likelihood this will help address the problem.   Risks such as bleeding, infection, abscess, leak, reoperation, injury to other organs, need for repair of tissues / organs, possible ostomy, hernia, heart attack, stroke, death, and other risks were discussed.  Goals of post-operative recovery were discussed as well.  We will work to minimize complications.  Questions were answered.  The patient expresses understanding & wishes to proceed with surgery.   OR FINDINGS:   Patient had bulky phlegmon from terminal ileum and cecum densely adherent to the right lower quadrant lateral sidewall and retroperitoneum.  Epicenter seem to be appendix.  Inflamed right ovary with cyst nearby.  No obvious bowel obstruction.  No peritonitis.  No creeping fat consistent with Crohn's disease.  No purulence or phlegmon.  Therefore decided do right colectomy for possible appendiceal mass/tumor.  Fallopian tube with some adhesions to it.  Not felt to be salvageable.  Therefore right salpingo-oophorectomy.  No obvious metastatic disease on visceral parietal peritoneum or liver.  It is an ileocolonic anastomosis that rests in the epigastric region.  DESCRIPTION:   Informed consent was confirmed.  The patient underwent general anaesthesia without difficulty.  The patient was positioned with arms tucked & secured appropriately.  VTE prevention in place.  The  patient's abdomen was clipped, prepped, & draped in a sterile fashion.  Surgical timeout confirmed our plan.  The patient was positioned in reverse Trendelenburg.  Abdominal entry was gained using  Varess technique at the left subcostal ridge on the anterior abdominal wall.  No elevated EtCO2 noted.  Port placed.  Camera inspection revealed no injury.  Extra ports were carefully placed under direct laparoscopic visualization.   I mobilized & reflected the greater omentum and small bowel in the upper abdomen.  Could see an inflamed distal ileum and ascending colon fixed to the right lateral sidewall over the retroperitoneum/psoas region.  There is no frank purulence.  I decided to try and mobilize around the region.  I work to try and mobilize the descending colon in a lateral to medial fashion.  Hit some inflamed peritoneum.  Decided to excise into the peritoneal wall until he had an uninflamed flat along the right lateral wall.  Came around more posteriorly.  I then worked to mobilize the terminal ileum off the retroperitoneum.  Eventually could elevate.  I could see the right ureter in its retroperitoneal position and was able to stay superficial to that.  The ovarian vein was involved in the inflamed ileocecal specimen though.  I followed and freed the mass off the right ureter carefully and came down to the right adnexa with the fallopian tube rather inflamed and stuck to it as well.  There was some yellow clear cystic dots contiguous with the ovary.  Obvious right ovarian simple cyst.  Did not see mucinous at all.  Was able to decompress that to better view things.  Eventually worked from medial to lateral and lateral to medial and then inferior to superior to free off the inflamed ileocecal region off the retroperitoneum and spare the right ureter.  However the ovarian vein cannot be freed off and ended up having to transect that.  Therefore transected the right fallopian tube and ovary off the uterus and coming around medially to complete salpingo-oophorectomy, staying away from the ureter.  With that I could better visualize the ileocecal region and finally mobilized the inflamed phlegmon off the  right lower quadrant pelvic sidewall and retroperitoneum.  I therefore could roll in an inferior to superior fashion.  And least the last 15-20 cm of ileum was quite inflamed and thickened.  The appendix could not be seen as there was just a bulky phlegmon that was covered by the peritoneum and retroperitoneal fat en bloc.  Cecum quite inflamed as well.  Ascending colon less so.  Again I encountered no obvious abscess cavity.  I encountered no mucin.  Did not seem classic for appendicitis nor a mucinous neoplasm.  I decided the safest option was to do a colectomy to get good margins around this.  I was able to elevate the proximal colon to isolate the ileocolonic pedicle.  I freed the proximal right sided colonic mesentery off the retroperitoneum including the duodenal sweep, pancreatic head, & Gerota's fascia of the right kidney. I was able to get underneath the hepatic flexure.  I was able to get underneath the proximal and mid transverse colon.  I isolated the proximal ileocecal pedicle.  I skeletonized it & transected the vessels.    I then proceeded to mobilize the terminal ileum & proximal "right" colon in a lateral to medial fashion.  I mobilized the ascending colon off It is side wall attachments to the paracolic gutter and retroperitoneum.  I could free the hepatic flexure colon  off the duodenal sweep and pancreatic head to mobilize the right colon off the retroperitoneum completely.  I came between a window of the retroperitoneum in the mid transverse colon.  Follow that proximally to finally completely mobilized the hepatic flexure in the right colon.  There did seem to be a thickened area at the proximal transverse colon just distal to the hepatic flexure.  Initially was concerned of a separate tumor but seemed more like it was full of stool with some adhesions.  I could isolate the pathology. When he went ahead and proceeded with transection.  I transected the distal ileal mesentery and then  transected at the distal ileum with a laparoscopic blue load 60 mm stapler.  I then transected transverse colon mesentery just proximal to a dominant middle colic arterial pedicle radially.  Transected at the proximal transverse colon with another blue load on the laparoscopic stapler.  We assured hemostasis.   I did a side-to-side stapled anastomosis of ileum to mid-transverse colon using a 15mm stapler x 2 firings in an isoperistaltic fashion.  (Distal stump of ileum to mid transverse colon for the distal end of the anastomosis.  Proximal end of colon stump to more proximal ileum for the proximal end of the anastomosis).  I sewed the common staple channel wound with an absorbable suture ( 2-0 V-lock) in a running [place] fashion from each corner and meeting in the center.  I did meticulous inspection prove an airtight closure.  I protected the anastomosis line with an anterior omentopexy of greater omentum using V lock suture.  We did reinspection of the abdomen.  Hemostasis was good.   Ureters, retroperitoneum, and bowel uninjured.  The anastomosis looked healthy.   We did a final irrigation of antibiotic solution (900 mg clindamycin/240 mg gentamicin in a liter of crystalloid) & held that while we placed the wound protector through the 17mm port site after it was enlarged in a Pfannenstiel fashion.  Specimens removed without incident.  I stitched the retroperitoneal fat along the ovarian vein that was a deep margin.  Hepatic flexure colon was full of stool with some mild fibrotic thickening/adhesions but no discrete tumor there.  Again bulky ileocecal region  Ports & wound protector removed.  We changed gloves per colon SSI prevention protocol.  We aspirated the antibiotic irrigation.  Hemostasis was good.  Sterile unused instruments were used from this point.  I closed the skin at the port sites using Monocryl stitch and sterile dressing.  I closed the extraction wound using a 0 Vicryl vertical peritoneal  closure and a #1 PDS transverse anterior rectal fascial closure like a small Pfannenstiel closure. I closed the skin with some interrupted Monocryl stitches.  I placed sterile dressings.     Patient is being extubated go to recovery room. I made an attempt to call her mother on her mobile phone but got no answer.  No one is available at this time.  We will try again later   Adin Hector, M.D., F.A.C.S. Gastrointestinal and Minimally Invasive Surgery [facility] [address] [place], [place] [address] [phone] Main / Paging

## 2019-03-30 NOTE — Anesthesia Procedure Notes (Signed)
Procedure Name: Intubation Date/Time: 03/30/2019 12:21 PM Performed by: Lissa Morales, CRNA Pre-anesthesia Checklist: Patient identified, Emergency Drugs available, Suction available and Patient being monitored Patient Re-evaluated:Patient Re-evaluated prior to induction Oxygen Delivery Method: Circle system utilized Preoxygenation: Pre-oxygenation with 100% oxygen Induction Type: IV induction Ventilation: Mask ventilation without difficulty Laryngoscope Size: Mac, 4 and Glidescope Grade View: Grade III Tube type: Oral Tube size: 7.0 mm Number of attempts: 1 Airway Equipment and Method: Stylet and Oral airway Placement Confirmation: ETT inserted through vocal cords under direct vision,  positive ETCO2 and breath sounds checked- equal and bilateral Secured at: 21 cm Tube secured with: Tape Dental Injury: Teeth and Oropharynx as per pre-operative assessment  Difficulty Due To: Difficulty was unanticipated, Difficult Airway- due to anterior larynx, Difficult Airway- due to dentition and Difficult Airway- due to large tongue Comments: CRna  Attemptx1, esophgus MAC3,  D.r hodierne x 1look with MAC 4,  glidescope  With good visualization and ETT passed easily thru  Cords. Overbite large tongue., slight anterior larynx

## 2019-03-30 NOTE — Plan of Care (Signed)
  Problem: Education: Goal: Knowledge of General Education information will improve Description: Including pain rating scale, medication(s)/side effects and non-pharmacologic comfort measures Outcome: Progressing   Problem: Health Behavior/Discharge Planning: Goal: Ability to manage health-related needs will improve Outcome: Progressing   Problem: Clinical Measurements: Goal: Ability to maintain clinical measurements within normal limits will improve Outcome: Progressing Goal: Will remain free from infection Outcome: Progressing Goal: Diagnostic test results will improve Outcome: Progressing Goal: Respiratory complications will improve Outcome: Progressing Goal: Cardiovascular complication will be avoided Outcome: Progressing   Problem: Nutrition: Goal: Adequate nutrition will be maintained Outcome: Progressing   Problem: Health Behavior/Discharge Planning: Goal: Ability to manage health-related needs will improve Outcome: Progressing   Problem: Clinical Measurements: Goal: Ability to maintain clinical measurements within normal limits will improve Outcome: Progressing   Problem: Clinical Measurements: Goal: Diagnostic test results will improve Outcome: Progressing

## 2019-03-31 ENCOUNTER — Encounter (HOSPITAL_COMMUNITY): Payer: Self-pay | Admitting: Surgery

## 2019-03-31 LAB — GLUCOSE, CAPILLARY
Glucose-Capillary: 136 mg/dL — ABNORMAL HIGH (ref 70–99)
Glucose-Capillary: 134 mg/dL — ABNORMAL HIGH (ref 70–99)
Glucose-Capillary: 112 mg/dL — ABNORMAL HIGH (ref 70–99)
Glucose-Capillary: 126 mg/dL — ABNORMAL HIGH (ref 70–99)

## 2019-03-31 LAB — POTASSIUM: Potassium: 3.7 mmol/L (ref 3.5–5.1)

## 2019-03-31 LAB — HEMOGLOBIN A1C
Hgb A1c MFr Bld: 5.9 % — ABNORMAL HIGH (ref 4.8–5.6)
Mean Plasma Glucose: 122.63 mg/dL

## 2019-03-31 LAB — CBC
HCT: 35.8 % — ABNORMAL LOW (ref 36.0–46.0)
Hemoglobin: 11.2 g/dL — ABNORMAL LOW (ref 12.0–15.0)
MCH: 24.8 pg — ABNORMAL LOW (ref 26.0–34.0)
MCHC: 31.3 g/dL (ref 30.0–36.0)
MCV: 79.2 fL — ABNORMAL LOW (ref 80.0–100.0)
Platelets: 384 10*3/uL (ref 150–400)
RBC: 4.52 MIL/uL (ref 3.87–5.11)
RDW: 14.7 % (ref 11.5–15.5)
WBC: 14.5 10*3/uL — ABNORMAL HIGH (ref 4.0–10.5)
nRBC: 0 % (ref 0.0–0.2)

## 2019-03-31 LAB — MAGNESIUM: Magnesium: 1.8 mg/dL (ref 1.7–2.4)

## 2019-03-31 LAB — HIV ANTIBODY (ROUTINE TESTING W REFLEX): HIV Screen 4th Generation wRfx: NONREACTIVE

## 2019-03-31 MED ORDER — DIPHENHYDRAMINE HCL 12.5 MG/5ML PO ELIX: 12.5000 mg | ORAL_SOLUTION | Freq: Four times a day (QID) | ORAL | Status: DC | PRN

## 2019-03-31 MED ORDER — PROCHLORPERAZINE MALEATE 10 MG PO TABS: 10.0000 mg | ORAL_TABLET | Freq: Four times a day (QID) | ORAL | Status: DC | PRN

## 2019-03-31 MED ORDER — OXYCODONE HCL 5 MG PO TABS
5.0000 mg | ORAL_TABLET | Freq: Four times a day (QID) | ORAL | Status: DC | PRN
  Administered 2019-03-31 – 2019-04-01 (×4): 5 mg via ORAL
  Filled 2019-03-31 (×4): qty 1

## 2019-03-31 MED ORDER — SODIUM CHLORIDE 0.9 % IV SOLN: Freq: Three times a day (TID) | INTRAVENOUS | Status: AC | PRN

## 2019-03-31 MED ORDER — HYDROMORPHONE HCL 1 MG/ML IJ SOLN: 0.5000 mg | INTRAMUSCULAR | Status: DC | PRN

## 2019-03-31 MED ORDER — GABAPENTIN 100 MG PO CAPS
200.0000 mg | ORAL_CAPSULE | Freq: Three times a day (TID) | ORAL | Status: DC
  Administered 2019-03-31 – 2019-04-02 (×5): 200 mg via ORAL
  Filled 2019-03-31 (×5): qty 2

## 2019-03-31 MED ORDER — HYDROCORTISONE 1 % EX CREA: 1.0000 "application " | TOPICAL_CREAM | Freq: Three times a day (TID) | CUTANEOUS | Status: DC | PRN

## 2019-03-31 MED ORDER — HYDROCORTISONE (PERIANAL) 2.5 % EX CREA: 1.0000 "application " | TOPICAL_CREAM | Freq: Four times a day (QID) | CUTANEOUS | Status: DC | PRN

## 2019-03-31 MED ORDER — ENOXAPARIN SODIUM 40 MG/0.4ML ~~LOC~~ SOLN
40.0000 mg | SUBCUTANEOUS | Status: DC
  Administered 2019-03-31 – 2019-04-01 (×2): 40 mg via SUBCUTANEOUS
  Filled 2019-03-31 (×2): qty 0.4

## 2019-03-31 MED ORDER — LIP MEDEX EX OINT
1.0000 "application " | TOPICAL_OINTMENT | Freq: Two times a day (BID) | CUTANEOUS | Status: DC
  Administered 2019-03-31 – 2019-04-02 (×5): 1 via TOPICAL
  Filled 2019-03-31: qty 7

## 2019-03-31 MED ORDER — ALUM & MAG HYDROXIDE-SIMETH 200-200-20 MG/5ML PO SUSP: 30.0000 mL | Freq: Four times a day (QID) | ORAL | Status: DC | PRN

## 2019-03-31 MED ORDER — TRAMADOL HCL 50 MG PO TABS
50.0000 mg | ORAL_TABLET | Freq: Four times a day (QID) | ORAL | Status: DC | PRN
  Administered 2019-03-31: 50 mg via ORAL
  Filled 2019-03-31: qty 1

## 2019-03-31 MED ORDER — ENALAPRILAT 1.25 MG/ML IV SOLN
0.6250 mg | Freq: Four times a day (QID) | INTRAVENOUS | Status: DC | PRN
  Filled 2019-03-31: qty 1

## 2019-03-31 MED ORDER — PHENOL 1.4 % MT LIQD: 1.0000 | OROMUCOSAL | Status: DC | PRN

## 2019-03-31 MED ORDER — ACETAMINOPHEN 325 MG PO TABS: 650.0000 mg | ORAL_TABLET | Freq: Four times a day (QID) | ORAL | Status: DC

## 2019-03-31 MED ORDER — ALUM & MAG HYDROXIDE-SIMETH 200-200-20 MG/5ML PO SUSP
30.0000 mL | Freq: Four times a day (QID) | ORAL | Status: DC | PRN
  Administered 2019-03-31: 30 mL via ORAL
  Filled 2019-03-31: qty 30

## 2019-03-31 MED ORDER — SODIUM CHLORIDE 0.9 % IV SOLN: 2.0000 g | Freq: Two times a day (BID) | INTRAVENOUS | Status: DC

## 2019-03-31 MED ORDER — MENTHOL 3 MG MT LOZG: 1.0000 | LOZENGE | OROMUCOSAL | Status: DC | PRN

## 2019-03-31 MED ORDER — SACCHAROMYCES BOULARDII 250 MG PO CAPS
250.0000 mg | ORAL_CAPSULE | Freq: Two times a day (BID) | ORAL | Status: DC
  Administered 2019-03-31 – 2019-04-02 (×4): 250 mg via ORAL
  Filled 2019-03-31 (×5): qty 1

## 2019-03-31 MED ORDER — TRAMADOL HCL 50 MG PO TABS: 50.0000 mg | ORAL_TABLET | Freq: Four times a day (QID) | ORAL | Status: DC | PRN

## 2019-03-31 MED ORDER — ENSURE SURGERY PO LIQD
237.0000 mL | Freq: Two times a day (BID) | ORAL | Status: DC
  Filled 2019-03-31 (×5): qty 237

## 2019-03-31 MED ORDER — MAGIC MOUTHWASH
15.0000 mL | Freq: Four times a day (QID) | ORAL | Status: DC | PRN
  Filled 2019-03-31: qty 15

## 2019-03-31 MED ORDER — PROCHLORPERAZINE EDISYLATE 10 MG/2ML IJ SOLN: 5.0000 mg | Freq: Four times a day (QID) | INTRAMUSCULAR | Status: DC | PRN

## 2019-03-31 MED ORDER — DIPHENHYDRAMINE HCL 50 MG/ML IJ SOLN: 12.5000 mg | Freq: Four times a day (QID) | INTRAMUSCULAR | Status: DC | PRN

## 2019-03-31 MED ORDER — GUAIFENESIN-DM 100-10 MG/5ML PO SYRP: 10.0000 mL | ORAL_SOLUTION | ORAL | Status: DC | PRN

## 2019-03-31 MED ORDER — ACETAMINOPHEN 500 MG PO TABS
1000.0000 mg | ORAL_TABLET | Freq: Four times a day (QID) | ORAL | Status: DC
  Administered 2019-03-31 – 2019-04-02 (×6): 1000 mg via ORAL
  Filled 2019-03-31 (×6): qty 2

## 2019-03-31 MED ORDER — METOPROLOL TARTRATE 5 MG/5ML IV SOLN: 5.0000 mg | Freq: Four times a day (QID) | INTRAVENOUS | Status: DC | PRN

## 2019-03-31 NOTE — Progress Notes (Signed)
[facility] Progress Note  1 Day Post-Op  Subjective: CC: abdominal pain Feels like she "got hit by a truck". Patient reports pain medication made her nauseated but she only had IV pain medication. Denies nausea outside of this. No flatus yet. Plans to get up and walk some more today.  Discussed surgery with patient.   Objective: Vital signs in last 24 hours: Temp:  [97.5 F (36.4 C)-98.4 F (36.9 C)] 98.2 F (36.8 C) (11/09 0759) Pulse Rate:  [83-106] 83 (11/09 0759) Resp:  [13-18] 18 (11/09 0759) BP: (115-136)/(72-85) 118/77 (11/09 0759) SpO2:  [99 %-100 %] 100 % (11/09 0759) Last BM Date: 03/28/19  Intake/Output from previous day: 11/08 0701 - 11/09 0700 In: 3556.1 [I.V.:2756.1; IV Piggyback:800] Out: [id number] [Urine:4190; Blood:75] Intake/Output this shift: Total I/O In: -  Out: 345 [Urine:345]  PE: Gen:  Alert, NAD, pleasant Card:  Regular rate and rhythm Pulm:  Normal effort, clear to auscultation bilaterally Abd: Soft, appropriately ttp, non-distended, BS hypoactive, no HSM, incisions C/D/I with dressings present Skin: warm and dry, no rashes  Psych: A&Ox3   Lab Results:  Recent Labs    03/29/19 1537 03/31/19 0553  WBC 7.6 14.5*  HGB 12.4 11.2*  HCT 40.1 35.8*  PLT 393 384   BMET Recent Labs    03/29/19 1537 03/31/19 0553  NA 136  --   K 3.4* 3.7  CL 102  --   CO2 24  --   GLUCOSE 85  --   BUN 11  --   CREATININE 0.56  --   CALCIUM 8.6*  --    PT/INR No results for input(s): LABPROT, INR in the last 72 hours. CMP     Component Value Date/Time   NA 136 03/29/2019 1537   NA 140 08/06/2017 1711   K 3.7 03/31/2019 0553   CL 102 03/29/2019 1537   CO2 24 03/29/2019 1537   GLUCOSE 85 03/29/2019 1537   BUN 11 03/29/2019 1537   BUN 8 08/06/2017 1711   CREATININE 0.56 03/29/2019 1537   CALCIUM 8.6 (L) 03/29/2019 1537   PROT 8.6 (H) 03/29/2019 1537   PROT 7.6 08/06/2017 1711   ALBUMIN 4.0 03/29/2019 1537   ALBUMIN 4.3 08/06/2017 1711    AST 15 03/29/2019 1537   ALT 12 03/29/2019 1537   ALKPHOS 57 03/29/2019 1537   BILITOT 0.8 03/29/2019 1537   BILITOT 0.2 08/06/2017 1711   GFRNONAA >60 03/29/2019 1537   GFRAA >60 03/29/2019 1537   Lipase     Component Value Date/Time   LIPASE 25 03/29/2019 1537       Studies/Results: Ct Abdomen Pelvis W Contrast  Result Date: 03/29/2019 CLINICAL DATA:  Abdominal pain.  Appendicitis suspected. EXAM: CT ABDOMEN AND PELVIS WITH CONTRAST TECHNIQUE: Multidetector CT imaging of the abdomen and pelvis was performed using the standard protocol following bolus administration of intravenous contrast. CONTRAST:  158mL OMNIPAQUE IOHEXOL 300 MG/ML  SOLN COMPARISON:  None. FINDINGS: Lower chest: The lung bases are clear. The heart size is normal. Hepatobiliary: The liver is normal. Normal gallbladder.There is no biliary ductal dilation. Pancreas: Normal contours without ductal dilatation. No peripancreatic fluid collection. Spleen: There is a slightly complex 4.2 cm hypoattenuating mass in the lower pole the spleen. The margins are irregular, especially on the coronal series. Adrenals/Urinary Tract: --Adrenal glands: No adrenal hemorrhage. --Right kidney/ureter: No hydronephrosis or perinephric hematoma. --Left kidney/ureter: No hydronephrosis or perinephric hematoma. --Urinary bladder: Unremarkable. Stomach/Bowel: --Stomach/Duodenum: No hiatal hernia or other gastric abnormality. Normal  duodenal course and caliber. --Small bowel: No dilatation or inflammation. --Colon: No focal abnormality. --Appendix: The appendix is significantly dilated. There are extensive inflammatory changes in the right lower quadrant. There is no free air. There is a small amount of reactive free fluid in the pelvis. Vascular/Lymphatic: Normal course and caliber of the major abdominal vessels. --No retroperitoneal lymphadenopathy. --No mesenteric lymphadenopathy. --No pelvic or inguinal lymphadenopathy. Reproductive: Unremarkable  Other: No ascites or free air. The abdominal wall is normal. Musculoskeletal. No acute displaced fractures. IMPRESSION: 1. Extensive inflammatory changes in the right lower quadrant with a significantly dilated appendix. Findings are consistent with acute appendicitis. While there is no free air, perforation is not entirely excluded. There is no abscess. 2. Small amount of reactive free fluid in the pelvis. 3. 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 This is essentially stable from prior study in 2019 and is favored to represent a benign process. However, it remains indeterminate. Follow-up with a nonemergent outpatient MRI is recommended for further evaluation of this finding. Electronically Signed   By: Constance Holster M.D.   On: 03/29/2019 22:42    Anti-infectives: Anti-infectives (From admission, onward)   Start     Dose/Rate Route Frequency Ordered Stop   03/30/19 1300  clindamycin (CLEOCIN) 900 mg, gentamicin (GARAMYCIN) 240 mg in sodium chloride 0.9 % 1,000 mL for intraperitoneal lavage      Irrigation To Surgery 03/30/19 1244 03/30/19 1437   03/30/19 1158  sodium chloride 0.9 % with cefTRIAXone (ROCEPHIN) ADS Med    Note to Pharmacy: Enrigue Catena   : cabinet override      03/30/19 1158 03/31/19 0014   03/30/19 0600  cefTRIAXone (ROCEPHIN) 2 g in sodium chloride 0.9 % 100 mL IVPB     2 g 200 mL/hr over 30 Minutes Intravenous Daily 03/29/19 2345     03/30/19 0000  metroNIDAZOLE (FLAGYL) IVPB 500 mg     500 mg 100 mL/hr over 60 Minutes Intravenous Every 8 hours 03/29/19 2345     03/29/19 2300  piperacillin-tazobactam (ZOSYN) IVPB 3.375 g     3.375 g 100 mL/hr over 30 Minutes Intravenous  Once 03/29/19 2254 03/30/19 0021       Assessment/Plan HTN Depression/anxiety Hx of gestational diabetes 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 Johney Maine - POD#1 - path pending - patient tolerating CLD, but no flatus - keep on clears until passing flatus given high potential for post-op ileus - repeat labs tomorrow AM - mobilize - d/c foley today  FEN: CLD, IVF VTE: scds, lovenox ID: [email or web address] 11/7; rocephin/flagyl 11/8>>  LOS: 1 day    Brigid Re , [facility] 03/31/2019, 10:07 AM Please see Amion for pager number during day hours 7:00am-4:30pm

## 2019-03-31 NOTE — Progress Notes (Signed)
RN assisted pt to ambulate hall. Pt completed 2 laps in the hall and tolerated well. C/o some abdominal cramps during ambulation. Denied SOB. Pt now on RA with O2 therapy PRN.

## 2019-04-01 LAB — BASIC METABOLIC PANEL
Anion gap: 9 (ref 5–15)
BUN: 6 mg/dL (ref 6–20)
CO2: 25 mmol/L (ref 22–32)
Calcium: 8.3 mg/dL — ABNORMAL LOW (ref 8.9–10.3)
Chloride: 101 mmol/L (ref 98–111)
Creatinine, Ser: 0.53 mg/dL (ref 0.44–1.00)
GFR calc Af Amer: >60 mL/min (ref >60)
GFR calc non Af Amer: >60 mL/min (ref >60)
Glucose, Bld: 114 mg/dL — ABNORMAL HIGH (ref 70–99)
Potassium: 3.1 mmol/L — ABNORMAL LOW (ref 3.5–5.1)
Sodium: 135 mmol/L (ref 135–145)

## 2019-04-01 LAB — GLUCOSE, CAPILLARY
Glucose-Capillary: 101 mg/dL — ABNORMAL HIGH (ref 70–99)
Glucose-Capillary: 108 mg/dL — ABNORMAL HIGH (ref 70–99)
Glucose-Capillary: 108 mg/dL — ABNORMAL HIGH (ref 70–99)
Glucose-Capillary: 88 mg/dL (ref 70–99)

## 2019-04-01 LAB — CBC
HCT: 32 % — ABNORMAL LOW (ref 36.0–46.0)
Hemoglobin: 9.9 g/dL — ABNORMAL LOW (ref 12.0–15.0)
MCH: 24.7 pg — ABNORMAL LOW (ref 26.0–34.0)
MCHC: 30.9 g/dL (ref 30.0–36.0)
MCV: 79.8 fL — ABNORMAL LOW (ref 80.0–100.0)
Platelets: 345 10*3/uL (ref 150–400)
RBC: 4.01 MIL/uL (ref 3.87–5.11)
RDW: 15.1 % (ref 11.5–15.5)
WBC: 10.4 10*3/uL (ref 4.0–10.5)
nRBC: 0 % (ref 0.0–0.2)

## 2019-04-01 MED ORDER — SODIUM CHLORIDE 0.9 % IV SOLN: 250.0000 mL | INTRAVENOUS | Status: DC | PRN

## 2019-04-01 MED ORDER — POTASSIUM CHLORIDE CRYS ER 20 MEQ PO TBCR
40.0000 meq | EXTENDED_RELEASE_TABLET | Freq: Two times a day (BID) | ORAL | Status: AC
  Administered 2019-04-01 (×2): 40 meq via ORAL
  Filled 2019-04-01 (×2): qty 2

## 2019-04-01 MED ORDER — SODIUM CHLORIDE 0.9% FLUSH
3.0000 mL | Freq: Two times a day (BID) | INTRAVENOUS | Status: DC
  Administered 2019-04-01 – 2019-04-02 (×3): 3 mL via INTRAVENOUS

## 2019-04-01 MED ORDER — SODIUM CHLORIDE 0.9% FLUSH: 3.0000 mL | INTRAVENOUS | Status: DC | PRN

## 2019-04-01 NOTE — Anesthesia Postprocedure Evaluation (Signed)
Anesthesia Post Note  Patient: Brooke Marks  Procedure(s) Performed: LAPAROSCOPIC RIGHT COLECTOMY WITH RIGHT SALPINGO OOPHORECTOMY TAP BLOCK (N/A Abdomen) LAPAROSCOPIC LYSIS OF ADHESIONS (N/A Abdomen)     Patient location during evaluation: PACU Anesthesia Type: General Level of consciousness: awake and alert Pain management: pain level controlled Vital Signs Assessment: post-procedure vital signs reviewed and stable Respiratory status: spontaneous breathing, nonlabored ventilation, respiratory function stable and patient connected to nasal cannula oxygen Cardiovascular status: blood pressure returned to baseline and stable Postop Assessment: no apparent nausea or vomiting Anesthetic complications: no    Last Vitals:  Vitals:   03/31/19 2359 04/01/19 0400  BP: 126/84 119/86  Pulse: 80 83  Resp: 18 18  Temp: 36.7 C 36.7 C  SpO2: 100% 99%    Last Pain:  Vitals:   04/01/19 0400  TempSrc: Oral  PainSc:                  Natoma S

## 2019-04-01 NOTE — Progress Notes (Signed)
[facility] Progress Note  2 Days Post-Op  Subjective: Patient doing well overall, nervous about what pathology will show. Denies nausea, +flatus. Pain fairly well controlled.   Objective: Vital signs in last 24 hours: Temp:  [97.8 F (36.6 C)-98.2 F (36.8 C)] 97.8 F (36.6 C) (11/10 0753) Pulse Rate:  [80-93] 91 (11/10 0753) Resp:  [18-20] 18 (11/10 0753) BP: (117-131)/(79-86) 117/85 (11/10 0753) SpO2:  [98 %-100 %] 98 % (11/10 0753) Weight:  [82 kg] 82 kg (11/10 0400) Last BM Date: 03/28/19  Intake/Output from previous day: 11/09 0701 - 11/10 0700 In: 2063 [P.O.:570; I.V.:994; IV Piggyback:499] Out: 1920 [id number] Intake/Output this shift: No intake/output data recorded.  PE: Gen:  Alert, NAD, pleasant Card:  Regular rate and rhythm Pulm:  Normal effort, clear to auscultation bilaterally Abd: Soft, appropriately ttp, non-distended, +BS, no HSM, incisions C/D/I with dressings present Skin: warm and dry, no rashes  Psych: A&Ox3   Lab Results:  Recent Labs    03/31/19 0553 04/01/19 0513  WBC 14.5* 10.4  HGB 11.2* 9.9*  HCT 35.8* 32.0*  PLT 384 345   BMET Recent Labs    03/29/19 1537 03/31/19 0553 04/01/19 0513  NA 136  --  135  K 3.4* 3.7 3.1*  CL 102  --  101  CO2 24  --  25  GLUCOSE 85  --  114*  BUN 11  --  6  CREATININE 0.56  --  0.53  CALCIUM 8.6*  --  8.3*   PT/INR No results for input(s): LABPROT, INR in the last 72 hours. CMP     Component Value Date/Time   NA 135 04/01/2019 0513   NA 140 08/06/2017 1711   K 3.1 (L) 04/01/2019 0513   CL 101 04/01/2019 0513   CO2 25 04/01/2019 0513   GLUCOSE 114 (H) 04/01/2019 0513   BUN 6 04/01/2019 0513   BUN 8 08/06/2017 1711   CREATININE 0.53 04/01/2019 0513   CALCIUM 8.3 (L) 04/01/2019 0513   PROT 8.6 (H) 03/29/2019 1537   PROT 7.6 08/06/2017 1711   ALBUMIN 4.0 03/29/2019 1537   ALBUMIN 4.3 08/06/2017 1711   AST 15 03/29/2019 1537   ALT 12 03/29/2019 1537   ALKPHOS 57  03/29/2019 1537   BILITOT 0.8 03/29/2019 1537   BILITOT 0.2 08/06/2017 1711   GFRNONAA >60 04/01/2019 0513   GFRAA >60 04/01/2019 0513   Lipase     Component Value Date/Time   LIPASE 25 03/29/2019 1537       Studies/Results: No results found.  Anti-infectives: Anti-infectives (From admission, onward)   Start     Dose/Rate Route Frequency Ordered Stop   03/31/19 1115  cefoTEtan (CEFOTAN) 2 g in sodium chloride 0.9 % 100 mL IVPB  Status:  Discontinued     2 g 200 mL/hr over 30 Minutes Intravenous Every 12 hours 03/31/19 1114 03/31/19 1117   03/30/19 1300  clindamycin (CLEOCIN) 900 mg, gentamicin (GARAMYCIN) 240 mg in sodium chloride 0.9 % 1,000 mL for intraperitoneal lavage      Irrigation To Surgery 03/30/19 1244 03/30/19 1437   03/30/19 1158  sodium chloride 0.9 % with cefTRIAXone (ROCEPHIN) ADS Med    Note to Pharmacy: Enrigue Catena   : cabinet override      03/30/19 1158 03/31/19 0014   03/30/19 0600  cefTRIAXone (ROCEPHIN) 2 g in sodium chloride 0.9 % 100 mL IVPB     2 g 200 mL/hr over 30 Minutes Intravenous Daily 03/29/19 2345  03/30/19 0000  metroNIDAZOLE (FLAGYL) IVPB 500 mg     500 mg 100 mL/hr over 60 Minutes Intravenous Every 8 hours 03/29/19 2345     03/29/19 2300  piperacillin-tazobactam (ZOSYN) IVPB 3.375 g     3.375 g 100 mL/hr over 30 Minutes Intravenous  Once 03/29/19 2254 03/30/19 0021       Assessment/Plan HTN Depression/anxiety Hx of gestational diabetes 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 Johney Maine - POD#2 - path pending - patient tolerating CLD, +flatus - advance diet - mobilize - possibly home tomorrow if tolerating diet and having bowel function - WBC normalized and pt afebrile, d/c abx   FEN: FLD, SLIV VTE: scds, lovenox ID: [email or web address] 11/7; rocephin/flagyl 11/8>11/10  LOS: 2 days     Brigid Re , [facility] 04/01/2019, 9:17 AM Please see Amion for pager number during day hours 7:00am-4:30pm

## 2019-04-02 LAB — BASIC METABOLIC PANEL
Anion gap: 9 (ref 5–15)
BUN: 7 mg/dL (ref 6–20)
CO2: 25 mmol/L (ref 22–32)
Calcium: 8.6 mg/dL — ABNORMAL LOW (ref 8.9–10.3)
Chloride: 100 mmol/L (ref 98–111)
Creatinine, Ser: 0.61 mg/dL (ref 0.44–1.00)
GFR calc Af Amer: >60 mL/min (ref >60)
GFR calc non Af Amer: >60 mL/min (ref >60)
Glucose, Bld: 110 mg/dL — ABNORMAL HIGH (ref 70–99)
Potassium: 3.2 mmol/L — ABNORMAL LOW (ref 3.5–5.1)
Sodium: 134 mmol/L — ABNORMAL LOW (ref 135–145)

## 2019-04-02 LAB — CBC
HCT: 36.3 % (ref 36.0–46.0)
Hemoglobin: 11.3 g/dL — ABNORMAL LOW (ref 12.0–15.0)
MCH: 24.9 pg — ABNORMAL LOW (ref 26.0–34.0)
MCHC: 31.1 g/dL (ref 30.0–36.0)
MCV: 80.1 fL (ref 80.0–100.0)
Platelets: 357 10*3/uL (ref 150–400)
RBC: 4.53 MIL/uL (ref 3.87–5.11)
RDW: 15.2 % (ref 11.5–15.5)
WBC: 11.1 10*3/uL — ABNORMAL HIGH (ref 4.0–10.5)
nRBC: 0 % (ref 0.0–0.2)

## 2019-04-02 LAB — GLUCOSE, CAPILLARY: Glucose-Capillary: 95 mg/dL (ref 70–99)

## 2019-04-02 LAB — SURGICAL PATHOLOGY

## 2019-04-02 MED ORDER — POTASSIUM CHLORIDE CRYS ER 20 MEQ PO TBCR
30.0000 meq | EXTENDED_RELEASE_TABLET | Freq: Once | ORAL | Status: AC
  Administered 2019-04-02: 30 meq via ORAL
  Filled 2019-04-02: qty 1

## 2019-04-02 MED ORDER — ACETAMINOPHEN 500 MG PO TABS: ORAL_TABLET | ORAL | 0 refills | Status: DC

## 2019-04-02 MED ORDER — OXYCODONE HCL 5 MG PO TABS: 5.0000 mg | ORAL_TABLET | Freq: Four times a day (QID) | ORAL | 0 refills | Status: DC | PRN

## 2019-04-02 MED ORDER — IBUPROFEN 200 MG PO TABS: ORAL_TABLET | ORAL | Status: DC

## 2019-04-02 MED ORDER — IBUPROFEN 200 MG PO TABS: 600.0000 mg | ORAL_TABLET | Freq: Four times a day (QID) | ORAL | Status: DC | PRN

## 2019-04-02 NOTE — Progress Notes (Signed)
3 Days Post-Op    CC: Abdominal pain  Subjective: Patient feels better, tolerating a soft diet and eating  Objective: Vital signs in last 24 hours: Temp:  [97.7 F (36.5 C)-98.2 F (36.8 C)] 97.7 F (36.5 C) (11/11 0600) Pulse Rate:  [82-95] 82 (11/11 0600) Resp:  [16-18] 18 (11/11 0600) BP: (128-136)/(88-94) 135/94 (11/11 0600) SpO2:  [98 %-100 %] 100 % (11/11 0600) Last BM Date: 04/01/19 1140 p.o.. Urine x6 Stool x2 Afebrile blood pressure elevated this a.m. K+ 3.2 WBC 11.1 Intake/Output from previous day: 11/10 0701 - 11/11 0700 In: 1140 [P.O.:1140] Out: -  Intake/Output this shift: No intake/output data recorded.  General appearance: alert, cooperative and no distress Resp: clear to auscultation bilaterally GI: Soft, sore, incision and port sites all look good.  She is soft and having bowel movements.  Lab Results:  Recent Labs    04/01/19 0513 04/02/19 0544  WBC 10.4 11.1*  HGB 9.9* 11.3*  HCT 32.0* 36.3  PLT 345 357    BMET Recent Labs    04/01/19 0513 04/02/19 0544  NA 135 134*  K 3.1* 3.2*  CL 101 100  CO2 25 25  GLUCOSE 114* 110*  BUN 6 7  CREATININE 0.53 0.61  CALCIUM 8.3* 8.6*   PT/INR No results for input(s): LABPROT, INR in the last 72 hours.  Recent Labs  Lab 03/29/19 1537  AST 15  ALT 12  ALKPHOS 57  BILITOT 0.8  PROT 8.6*  ALBUMIN 4.0     Lipase     Component Value Date/Time   LIPASE 25 03/29/2019 1537     Medications: . acetaminophen  1,000 mg Oral Q6H  . Chlorhexidine Gluconate Cloth  6 each Topical Daily  . enoxaparin (LOVENOX) injection  40 mg Subcutaneous Q24H  . feeding supplement  237 mL Oral BID BM  . gabapentin  200 mg Oral TID  . insulin aspart  0-15 Units Subcutaneous TID WC  . insulin aspart  0-5 Units Subcutaneous QHS  . lip balm  1 application Topical BID  . saccharomyces boulardii  250 mg Oral BID  . 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 S/p laparoscopic right colectomy with intracorporeal anastomosis, right salpingo oophorectomy, omentopexy, tap block, laparoscopic lysis of adhesions 03/30/19, Dr. Michael Boston - POD#3 - path pending - patient soft diet and walking - WBC normalized and pt afebrile, d/c abx    FEN:  Regular diet VTE: scds, lovenox ID: zosyn 11/7; rocephin/flagyl 11/8>11/10  Plan: Discharge home today.  Follow-up with Dr. Johney Maine on 04/21/2019.   LOS: 3 days    Haisley Arens 04/02/2019 Please see Amion

## 2019-04-02 NOTE — Discharge Instructions (Signed)
SURGERY: POST OP INSTRUCTIONS (Surgery for small bowel obstruction, colon resection, etc)  DR. Johney Maine will call you when he gets the pathology report ######################################################################  EAT Gradually transition to a high fiber diet with a fiber supplement over the next few days after discharge  WALK Walk an hour a day.  Control your pain to do that.    CONTROL PAIN Control pain so that you can walk, sleep, tolerate sneezing/coughing, go up/down stairs.  HAVE A BOWEL MOVEMENT DAILY Keep your bowels regular to avoid problems.  OK to try a laxative to override constipation.  OK to use an antidairrheal to slow down diarrhea.  Call if not better after 2 tries  CALL IF YOU HAVE PROBLEMS/CONCERNS Call if you are still struggling despite following these instructions. Call if you have concerns not answered by these instructions  ######################################################################   DIET Follow a light diet the first few days at home.  Start with a bland diet such as soups, liquids, starchy foods, low fat foods, etc.  If you feel full, bloated, or constipated, stay on a ful liquid or pureed/blenderized diet for a few days until you feel better and no longer constipated. Be sure to drink plenty of fluids every day to avoid getting dehydrated (feeling dizzy, not urinating, etc.). Gradually add a fiber supplement to your diet over the next week.  Gradually get back to a regular solid diet.  Avoid fast food or heavy meals the first week as you are more likely to get nauseated. It is expected for your digestive tract to need a few months to get back to normal.  It is common for your bowel movements and stools to be irregular.  You will have occasional bloating and cramping that should eventually fade away.  Until you are eating solid food normally, off all pain medications, and back to regular activities; your bowels will not be normal. Focus on  eating a low-fat, high fiber diet the rest of your life (See Getting to [place], below).  CARE of your INCISION or WOUND It is good for closed incision and even open wounds to be washed every day.  Shower every day.  Short baths are fine.  Wash the incisions and wounds clean with soap & water.    If you have a closed incision(s), wash the incision with soap & water every day.  You may leave closed incisions open to air if it is dry.   You may cover the incision with clean gauze & replace it after your daily shower for comfort. If you have skin tapes (Steristrips) or skin glue (Dermabond) on your incision, leave them in place.  They will fall off on their own like a scab.  You may trim any edges that curl up with clean scissors.  If you have staples, set up an appointment for them to be removed in the office in 10 days after surgery.  If you have a drain, wash around the skin exit site with soap & water and place a new dressing of gauze or band aid around the skin every day.  Keep the drain site clean & dry.    If you have an open wound with packing, see wound care instructions.  In general, it is encouraged that you remove your dressing and packing, shower with soap & water, and replace your dressing once a day.  Pack the wound with clean gauze moistened with normal (0.9%) saline to keep the wound moist & uninfected.  Pressure  on the dressing for 30 minutes will stop most wound bleeding.  Eventually your body will heal & pull the open wound closed over the next few months.  Raw open wounds will occasionally bleed or secrete yellow drainage until it heals closed.  Drain sites will drain a little until the drain is removed.  Even closed incisions can have mild bleeding or drainage the first few days until the skin edges scab over & seal.   If you have an open wound with a wound vac, see wound vac care instructions.     ACTIVITIES as tolerated Start light daily activities --- self-care,  walking, climbing stairs-- beginning the day after surgery.  Gradually increase activities as tolerated.  Control your pain to be active.  Stop when you are tired.  Ideally, walk several times a day, eventually an hour a day.   Most people are back to most day-to-day activities in a few weeks.  It takes 4-8 weeks to get back to unrestricted, intense activity. If you can walk 30 minutes without difficulty, it is safe to try more intense activity such as jogging, treadmill, bicycling, low-impact aerobics, swimming, etc. Save the most intensive and strenuous activity for last (Usually 4-8 weeks after surgery) such as sit-ups, heavy lifting, contact sports, etc.  Refrain from any intense heavy lifting or straining until you are off narcotics for pain control.  You will have off days, but things should improve week-by-week. DO NOT PUSH THROUGH PAIN.  Let pain be your guide: If it hurts to do something, don't do it.  Pain is your body warning you to avoid that activity for another week until the pain goes down. You may drive when you are no longer taking narcotic prescription pain medication, you can comfortably wear a seatbelt, and you can safely make sudden turns/stops to protect yourself without hesitating due to pain. You may have sexual intercourse when it is comfortable. If it hurts to do something, stop.  MEDICATIONS Take your usually prescribed home medications unless otherwise directed.   Blood thinners:  Usually you can restart any strong blood thinners after the second postoperative day.  It is OK to take aspirin right away.     If you are on strong blood thinners (warfarin/Coumadin, Plavix, Xerelto, Eliquis, Pradaxa, etc), discuss with your surgeon, medicine PCP, and/or cardiologist for instructions on when to restart the blood thinner & if blood monitoring is needed (PT/INR blood check, etc).     PAIN CONTROL Pain after surgery or related to activity is often due to strain/injury to muscle,  tendon, nerves and/or incisions.  This pain is usually short-term and will improve in a few months.  To help speed the process of healing and to get back to regular activity more quickly, DO THE FOLLOWING THINGS TOGETHER: 1. Increase activity gradually.  DO NOT PUSH THROUGH PAIN 2. Use Ice and/or Heat 3. Try Gentle Massage and/or Stretching 4. Take over the counter pain medication 5. Take Narcotic prescription pain medication for more severe pain  Good pain control = faster recovery.  It is better to take more medicine to be more active than to stay in bed all day to avoid medications. 1.  Increase activity gradually Avoid heavy lifting at first, then increase to lifting as tolerated over the next 6 weeks. Do not push through the pain.  Listen to your body and avoid positions and maneuvers than reproduce the pain.  Wait a few days before trying something more intense Walking an hour  a day is encouraged to help your body recover faster and more safely.  Start slowly and stop when getting sore.  If you can walk 30 minutes without stopping or pain, you can try more intense activity (running, jogging, aerobics, cycling, swimming, treadmill, sex, sports, weightlifting, etc.) Remember: If it hurts to do it, then dont do it! 2. Use Ice and/or Heat You will have swelling and bruising around the incisions.  This will take several weeks to resolve. Ice packs or heating pads (6-8 times a day, 30-60 minutes at a time) will help sooth soreness & bruising. Some people prefer to use ice alone, heat alone, or alternate between ice & heat.  Experiment and see what works best for you.  Consider trying ice for the first few days to help decrease swelling and bruising; then, switch to heat to help relax sore spots and speed recovery. Shower every day.  Short baths are fine.  It feels good!  Keep the incisions and wounds clean with soap & water.   3. Try Gentle Massage and/or Stretching Massage at the area of pain  many times a day Stop if you feel pain - do not overdo it 4. Take over the counter pain medication This helps the muscle and nerve tissues become less irritable and calm down faster Choose ONE of the following over-the-counter anti-inflammatory medications: Acetaminophen 500mg  tabs (Tylenol) 1-2 pills with every meal and just before bedtime (avoid if you have liver problems or if you have acetaminophen in you narcotic prescription) Naproxen 220mg  tabs (ex. Aleve, Naprosyn) 1-2 pills twice a day (avoid if you have kidney, stomach, IBD, or bleeding problems) Ibuprofen 200mg  tabs (ex. Advil, Motrin) 3-4 pills with every meal and just before bedtime (avoid if you have kidney, stomach, IBD, or bleeding problems) Take with food/snack several times a day as directed for at least 2 weeks to help keep pain / soreness down & more manageable. 5. Take Narcotic prescription pain medication for more severe pain A prescription for strong pain control is often given to you upon discharge (for example: oxycodone/Percocet, hydrocodone/Norco/Vicodin, or tramadol/Ultram) Take your pain medication as prescribed. Be mindful that most narcotic prescriptions contain Tylenol (acetaminophen) as well - avoid taking too much Tylenol. If you are having problems/concerns with the prescription medicine (does not control pain, nausea, vomiting, rash, itching, etc.), please call us [phone] to see if we need to switch you to a different pain medicine that will work better for you and/or control your side effects better. If you need a refill on your pain medication, you must call the office before 4 pm and on weekdays only.  By federal law, prescriptions for narcotics cannot be called into a pharmacy.  They must be filled out on paper & picked up from our office by the patient or authorized caretaker.  Prescriptions cannot be filled after 4 pm nor on weekends.    WHEN TO CALL us [phone] Severe uncontrolled or worsening  pain  Fever over 101 F (38.5 C) Concerns with the incision: Worsening pain, redness, rash/hives, swelling, bleeding, or drainage Reactions / problems with new medications (itching, rash, hives, nausea, etc.) Nausea and/or vomiting Difficulty urinating Difficulty breathing Worsening fatigue, dizziness, lightheadedness, blurred vision Other concerns If you are not getting better after two weeks or are noticing you are getting worse, contact our office [phone] for further advice.  We may need to adjust your medications, re-evaluate you in the office, send you to the emergency room, or  see what other things we can do to help. The clinic staff is available to answer your questions during regular business hours (8:30am-5pm).  Please dont hesitate to call and ask to speak to one of our nurses for clinical concerns.    A surgeon from  [facility] is always on call at the hospitals 24 hours/day If you have a medical emergency, go to the nearest emergency room or call 911.  FOLLOW UP in our office One the day of your discharge from the hospital (or the next business weekday), please call [facility] to set up or confirm an appointment to see your surgeon in the office for a follow-up appointment.  Usually it is 2-3 weeks after your surgery.   If you have skin staples at your incision(s), let the office know so we can set up a time in the office for the nurse to remove them (usually around 10 days after surgery). Make sure that you call for appointments the day of discharge (or the next business weekday) from the hospital to ensure a convenient appointment time. IF YOU HAVE DISABILITY OR FAMILY LEAVE FORMS, BRING THEM TO THE OFFICE FOR PROCESSING.  DO NOT GIVE THEM TO YOUR DOCTOR.  [facility] [address] [place], [place], [place]  [address] ? [phone] - Main [phone] - [place],  [phone] -  Fax [email or web address]  GETTING TO GOOD BOWEL HEALTH. It is expected for your digestive tract to need a few months to get back to normal.  It is common for your bowel movements and stools to be irregular.  You will have occasional bloating and cramping that should eventually fade away.  Until you are eating solid food normally, off all pain medications, and back to regular activities; your bowels will not be normal.   Avoiding constipation The goal: ONE SOFT BOWEL MOVEMENT A DAY!    Drink plenty of fluids.  Choose water first. TAKE A FIBER SUPPLEMENT EVERY DAY THE REST OF YOUR LIFE During your first week back home, gradually add back a fiber supplement every day Experiment which form you can tolerate.   There are many forms such as powders, tablets, wafers, gummies, etc Psyllium bran (Metamucil), methylcellulose (Citrucel), Miralax or Glycolax, Benefiber, Flax Seed.  Adjust the dose week-by-week (1/2 dose/day to 6 doses a day) until you are moving your bowels 1-2 times a day.  Cut back the dose or try a different fiber product if it is giving you problems such as diarrhea or bloating. Sometimes a laxative is needed to help jump-start bowels if constipated until the fiber supplement can help regulate your bowels.  If you are tolerating eating & you are farting, it is okay to try a gentle laxative such as double dose MiraLax, prune juice, or Milk of Magnesia.  Avoid using laxatives too often. Stool softeners can sometimes help counteract the constipating effects of narcotic pain medicines.  It can also cause diarrhea, so avoid using for too long. If you are still constipated despite taking fiber daily, eating solids, and a few doses of laxatives, call our office. Controlling diarrhea Try drinking liquids and eating bland foods for a few days to avoid stressing your intestines further. Avoid dairy products (especially milk & ice cream) for a short time.  The intestines often can lose the  ability to digest lactose when stressed. Avoid foods that cause gassiness or bloating.  Typical foods include beans and other legumes, cabbage, broccoli, and dairy foods.  Avoid greasy, spicy, fast foods.  Every person has some sensitivity to other foods, so listen to your body and avoid those foods that trigger problems for you. Probiotics (such as active yogurt, Align, etc) may help repopulate the intestines and colon with normal bacteria and calm down a sensitive digestive tract Adding a fiber supplement gradually can help thicken stools by absorbing excess fluid and retrain the intestines to act more normally.  Slowly increase the dose over a few weeks.  Too much fiber too soon can backfire and cause cramping & bloating. It is okay to try and slow down diarrhea with a few doses of antidiarrheal medicines.   Bismuth subsalicylate (ex. Kayopectate, Pepto Bismol) for a few doses can help control diarrhea.  Avoid if pregnant.   Loperamide (Imodium) can slow down diarrhea.  Start with one tablet (2mg ) first.  Avoid if you are having fevers or severe pain.   TROUBLESHOOTING IRREGULAR BOWELS 1) Start with a soft & bland diet. No spicy, greasy, or fried foods.  2) Avoid gluten/wheat or dairy products from diet to see if symptoms improve. 3) Miralax 17gm or flax seed mixed in [place]. water or juice-daily. May use 2-4 times a day as needed. 4) Gas-X, Phazyme, etc. as needed for gas & bloating.  5) Prilosec (omeprazole) over-the-counter as needed 6)  Consider probiotics (Align, Activa, etc) to help calm the bowels down  Call your doctor if you are getting worse or not getting better.  Sometimes further testing (cultures, endoscopy, X-ray studies, CT scans, bloodwork, etc.) may be needed to help diagnose and treat the cause of the diarrhea. [facility], [place], [place], [place], [place]  [address] [phone] - Main.    [phone]  - Toll Free.   [phone] -  Fax [email or web address]

## 2019-04-02 NOTE — Discharge Summary (Signed)
Physician Discharge Summary  Patient ID: ISABELL ORDERS MRN: [id number] DOB/AGE: Dec 01, 1980 38 y.o.  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 Cyndia Bent Course:  Patient is a 38 year old female who presents to the emergency department with a 77-month history of intermittent right lower quadrant abdominal pain.  Pain usually improved after her menstrual cycle.  Patient has had right lower quadrant pain persistently for the past 8 days.  Her menstrual cycle ended 5 days ago and pain persisted.  She has had nausea.  She has noted some chills.  She is noted some constipation.  Patient presented to the emergency department for evaluation.  Laboratory studies show a normal white blood cell count of 7.6.  She is afebrile.  A CT scan of the abdomen and pelvis was obtained which shows a dilated appendix with periappendiceal inflammation which is fairly extensive consistent with acute appendicitis.  General surgery was called for evaluation and management. Patient has a surgical history of cesarean section.  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  She was seen in  the emergency department by Dr. Armandina Gemma it was his opinion she most likely had acute appendicitis.  He noted this was an atypical presentation with intermittent pain for 2 months and persistent pain for 8 days.  She was afebrile and white blood cell count was normal however CT scan of the abdomen the pelvis showed a dilated appendix with surrounding inflammatory changes consistent with acute appendicitis.  She was seen and evaluated the following a.m. and taken the operating room by Dr. Alwyn Pea.    On exam in the OR the patient had a bulky phlegmon from the terminal ileum and the cecum densely adherent to the right lower quadrant lateral sidewall and retroperitoneum.  The epicenter appears to be the appendix.  Inflamed right ovary with a cyst nearby.  No obvious bowel obstruction no peritonitis no creeping fat consistent with Crohn's disease.  No purulence or phlegmon was noted.  At that time he proceeded to do a right colectomy for possible appendiceal mass/tumor.  Fallopian tube was some adhesions to it was not felt to be salvageable therefore she underwent a right salpingo-oophorectomy.  There was no obvious metastatic disease on the visceral parietal peritoneum or liver.  Postop the patient is done extremely well.  She is mobilize her diet is been advanced, she is tolerating soft diet, and having bowel movements.  Her incisions look good.  Labs show some hypokalemia and improving WBC.  At this point it was Dr. Amie Portland opinion she was ready for discharge.  At the time of this dictation her pathology report is still not available, in process.  CT scan on admission also showed a complex cystic lesion essentially stable from 2019 as thought to be a benign process.  But MRI was recommended for follow-up  not emergently as an outpatient.  Discharge instructions include follow-up with her PCP for this splenic cyst, and her blood pressure.   CBC Latest Ref Rng & Units 04/02/2019 04/01/2019 03/31/2019   WBC 4.0 - 10.5 K/uL 11.1(H) 10.4 14.5(H)  Hemoglobin 12.0 - 15.0 g/dL 11.3(L) 9.9(L) 11.2(L)  Hematocrit 36.0 - 46.0 % 36.3 32.0(L) 35.8(L)  Platelets 150 - 400 K/uL 357 345 384   CMP Latest Ref Rng & Units 04/02/2019 04/01/2019 03/31/2019  Glucose 70 - 99 mg/dL 110(H) 114(H) -  BUN 6 - 20 mg/dL 7 6 -  Creatinine 0.44 - 1.00 mg/dL 0.61 0.53 -  Sodium 135 - 145 mmol/L 134(L) 135 -  Potassium 3.5 - 5.1 mmol/L 3.2(L) 3.1(L) 3.7  Chloride 98 - 111 mmol/L 100 101 -  CO2 22 - 32 mmol/L 25 25 -  Calcium 8.9 - 10.3 mg/dL 8.6(L) 8.3(L) -  Total Protein 6.5 - 8.1 g/dL - - -  Total Bilirubin 0.3 - 1.2 mg/dL - - -  Alkaline Phos 38 - 126 U/L - - -  AST 15 - 41 U/L - - -  ALT 0 - 44 U/L - - -   Pathology: Pending Dr. Johney Maine will call results when they are available.  Condition on discharge: Improved.    Disposition: Discharge disposition: 01-Home or Self Care        Allergies as of 04/02/2019   No Known Allergies     Medication List    STOP taking these medications   omeprazole 20 MG capsule Commonly known as: PRILOSEC     TAKE these medications   acetaminophen 500 MG tablet Commonly known as: TYLENOL You can take 1000 mg every 8 hours as needed for pain.  For the first couple days at home I would just take it regularly, and as the pain improves you can use it as needed.  You can alternate this with ibuprofen.  You can buy this over-the-counter at any drugstore.  Do not exceed 4000 mg of Tylenol it can harm your liver.   amLODipine 10 MG tablet Commonly known as: NORVASC Take 1 tablet (10 mg total) by mouth daily.   ibuprofen 200 MG tablet Commonly known as: ADVIL You can take 2 to 3 tablets every 6 hours as needed for pain not relieved by plain Tylenol.  You can alternate this with plain Tylenol.  Start this about 2 hours after your Tylenol dose.  You can buy this over-the-counter at any drugstore.   oxyCODONE 5 MG immediate release tablet Commonly known as: Oxy  IR/ROXICODONE Take 1 tablet (5 mg total) by mouth every 6 (six) hours as needed for severe pain (Pain not relieved by Tylenol and ibuprofen.).      Follow-up Information    Schedule an appointment as soon as possible for a visit with Your primary care physician.   Why: Recommend outpatient MRI for splenic cyst, appears benign and unchanged since 2019 but can be further evaluated with MRI.  Also to follow up on your blood pressure       Michael Boston, MD. Go on 04/21/2019.   Specialty: General Surgery Why: Follow up appointment scheduled for 10:45 AM. Please arrive 30 min prior to appointment time. Bring photo ID and insurance information. No additional charge for post-op follow up.  Contact information: [address] [place] [address] [phone]        Kathrene Alu, MD. Schedule an appointment as soon as possible for a visit.  Specialty: Family Medicine Why: Recommend outpatient MRI for splenic cyst, appears benign and unchanged since 2019 but can be further evaluated with MRI. Also to follow up on your blood pressure    Recommend outpatient MRI for splenic cyst, appears benign and unchanged since 2019 but can b Contact information: [address] [address] [phone]           Signed: Earnstine Regal 04/02/2019, 9:20 AM

## 2019-04-04 ENCOUNTER — Encounter (HOSPITAL_COMMUNITY): Payer: Self-pay

## 2019-04-04 ENCOUNTER — Emergency Department (HOSPITAL_COMMUNITY)
Admission: EM | Admit: 2019-04-04 | Discharge: 2019-04-04 | Disposition: A | Discharge status: Home / Self Care | Payer: 59 | Attending: Emergency Medicine | Admitting: Emergency Medicine

## 2019-04-04 ENCOUNTER — Other Ambulatory Visit: Payer: Self-pay

## 2019-04-04 DIAGNOSIS — Z9089 Acquired absence of other organs: Secondary | ICD-10-CM | POA: Diagnosis not present

## 2019-04-04 DIAGNOSIS — E119 Type 2 diabetes mellitus without complications: Secondary | ICD-10-CM | POA: Insufficient documentation

## 2019-04-04 DIAGNOSIS — I10 Essential (primary) hypertension: Secondary | ICD-10-CM | POA: Diagnosis not present

## 2019-04-04 DIAGNOSIS — Z79899 Other long term (current) drug therapy: Secondary | ICD-10-CM | POA: Diagnosis not present

## 2019-04-04 DIAGNOSIS — R55 Syncope and collapse: Secondary | ICD-10-CM | POA: Diagnosis present

## 2019-04-04 DIAGNOSIS — E860 Dehydration: Secondary | ICD-10-CM | POA: Diagnosis not present

## 2019-04-04 LAB — URINALYSIS, ROUTINE W REFLEX MICROSCOPIC
Bilirubin Urine: NEGATIVE
Glucose, UA: NEGATIVE mg/dL
Ketones, ur: 80 mg/dL — AB
Nitrite: NEGATIVE
Protein, ur: NEGATIVE mg/dL
Specific Gravity, Urine: 1.016 (ref 1.005–1.030)
pH: 6 (ref 5.0–8.0)

## 2019-04-04 LAB — CBC
HCT: 39.2 % (ref 36.0–46.0)
Hemoglobin: 12.2 g/dL (ref 12.0–15.0)
MCH: 24.9 pg — ABNORMAL LOW (ref 26.0–34.0)
MCHC: 31.1 g/dL (ref 30.0–36.0)
MCV: 80.2 fL (ref 80.0–100.0)
Platelets: 408 10*3/uL — ABNORMAL HIGH (ref 150–400)
RBC: 4.89 MIL/uL (ref 3.87–5.11)
RDW: 15.3 % (ref 11.5–15.5)
WBC: 13.2 10*3/uL — ABNORMAL HIGH (ref 4.0–10.5)
nRBC: 0 % (ref 0.0–0.2)

## 2019-04-04 LAB — COMPREHENSIVE METABOLIC PANEL
ALT: 26 U/L (ref 0–44)
AST: 34 U/L (ref 15–41)
Albumin: 4 g/dL (ref 3.5–5.0)
Alkaline Phosphatase: 52 U/L (ref 38–126)
Anion gap: 12 (ref 5–15)
BUN: 14 mg/dL (ref 6–20)
CO2: 23 mmol/L (ref 22–32)
Calcium: 8.8 mg/dL — ABNORMAL LOW (ref 8.9–10.3)
Chloride: 103 mmol/L (ref 98–111)
Creatinine, Ser: 0.55 mg/dL (ref 0.44–1.00)
GFR calc Af Amer: >60 mL/min (ref >60)
GFR calc non Af Amer: >60 mL/min (ref >60)
Glucose, Bld: 107 mg/dL — ABNORMAL HIGH (ref 70–99)
Potassium: 3.5 mmol/L (ref 3.5–5.1)
Sodium: 138 mmol/L (ref 135–145)
Total Bilirubin: 0.6 mg/dL (ref 0.3–1.2)
Total Protein: 8.1 g/dL (ref 6.5–8.1)

## 2019-04-04 LAB — I-STAT BETA HCG BLOOD, ED (MC, WL, AP ONLY): I-stat hCG, quantitative: <5 m[IU]/mL (ref <5)

## 2019-04-04 LAB — LIPASE, BLOOD: Lipase: 24 U/L (ref 11–51)

## 2019-04-04 MED ORDER — SODIUM CHLORIDE 0.9 % IV BOLUS
1000.0000 mL | Freq: Once | INTRAVENOUS | Status: AC
  Administered 2019-04-04: 1000 mL via INTRAVENOUS

## 2019-04-04 MED ORDER — SODIUM CHLORIDE 0.9% FLUSH
3.0000 mL | Freq: Once | INTRAVENOUS | Status: AC
  Administered 2019-04-04: 3 mL via INTRAVENOUS

## 2019-04-04 NOTE — ED Notes (Signed)
P 

## 2019-04-04 NOTE — ED Provider Notes (Addendum)
[facility] DEPT Provider Note   CSN: [id number] Arrival date & time: 04/04/19  [id number]     History   Chief Complaint Chief Complaint  Patient presents with  . Diarrhea  . Loss of Consciousness    HPI Brooke Marks is a 38 y.o. female.     HPI Patient presents after syncopal episode.  Discharged 2 days ago from hospital after complicated appendicitis/appendectomy.  Had to have her cecum removed also to to phlegmon.  Also had right fallopian tube removed.  Has had decreased oral intake since she went home.  States she was on the bathroom today.  She still having some diarrhea over the last 2 days.  States that she felt very lightheaded and sweaty while time to the bathroom and passed out.  No injury.  Feeling somewhat better now but still feels fatigued.  Abdominal pain is overall improving since the surgery.  States she did take her amlodipine last night.  Has been off along the hospital but yesterday was her second day back on it.  States was also the first day that she took it with an oxycodone.  States slight vaginal bleeding at times.  No chest pain.  No trouble breathing. Past Medical History:  Diagnosis Date  . Anxiety   . Chlamydia   . Depression   . Diabetes mellitus without complication ([place])    Gestational  . Hypertension   . 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 Rarely uses her cell phone for calls. Usually communicates via text. Affecting her job. Works with [employer]. 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 Concerned that her employer is planning to return her previous position. Denies agoraphobia.     Marland Kitchen PIH (pregnancy induced hypertension) 01/27/2016  . S/P cesarean section 01/27/2016    Patient Active Problem List   Diagnosis Date Noted  . 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 Hyperglycemia 03/30/2019  . Gestational  diabetes mellitus 12/17/2018  . Breast pain 12/17/2018  . Hypertension 01/27/2016  . S/P cesarean section 01/27/2016  . Anxiety state 04/13/2015  . PMDD (premenstrual dysphoric disorder) 12/25/2014  . Annual physical exam 12/25/2014  . GERD (gastroesophageal reflux disease) 02/16/2014  . CARPAL TUNNEL SYNDROME, LEFT 04/11/2010  . CHEST PAIN, ATYPICAL 04/11/2010  . HERPES SIMPLEX INFECTION, RECURRENT 02/26/2009  . OBESITY, NOS 07/19/2006    Past Surgical History:  Procedure Laterality Date  . CESAREAN SECTION    . CESAREAN SECTION N/A 01/27/2016   Procedure: CESAREAN SECTION;  Surgeon: Janyth Contes, MD;  Location: [facility];  Service: Obstetrics;  Laterality: N/A;  MD requests RNFA  . COLON RESECTION N/A 03/30/2019   Procedure: LAPAROSCOPIC RIGHT COLECTOMY WITH RIGHT SALPINGO OOPHORECTOMY TAP BLOCK;  Surgeon: Michael Boston, MD;  Location: [facility];  Service: General;  Laterality: N/A;  . LAPAROSCOPIC LYSIS OF ADHESIONS N/A 03/30/2019   Procedure: LAPAROSCOPIC LYSIS OF ADHESIONS;  Surgeon: Michael Boston, MD;  Location: [facility];  Service: General;  Laterality: N/A;  . WISDOM TOOTH EXTRACTION       OB History    Gravida  3   Para  1   Term  1   Preterm  0   AB  2   Living  1     SAB  0   TAB  2   Ectopic  0   Multiple      Live Births  1  Home Medications    Prior to Admission medications   Medication Sig Start Date End Date Taking? Authorizing Provider  acetaminophen (TYLENOL) 500 MG tablet You can take 1000 mg every 8 hours as needed for pain.  For the first couple days at home I would just take it regularly, and as the pain improves you can use it as needed.  You can alternate this with ibuprofen.  You can buy this over-the-counter at any drugstore.  Do not exceed 4000 mg of Tylenol it can harm your liver. 04/02/19   Earnstine Regal, PA-C  amLODipine (NORVASC) 10 MG tablet Take 1 tablet (10 mg total) by mouth daily. 03/27/18   Bufford Lope, DO  ibuprofen (ADVIL) 200 MG tablet You can take 2 to 3 tablets every 6 hours as needed for pain not relieved by plain Tylenol.  You can alternate this with plain Tylenol.  Start this about 2 hours after your Tylenol dose.  You can buy this over-the-counter at any drugstore. 04/02/19   Earnstine Regal, PA-C  oxyCODONE (OXY IR/ROXICODONE) 5 MG immediate release tablet Take 1 tablet (5 mg total) by mouth every 6 (six) hours as needed for severe pain (Pain not relieved by Tylenol and ibuprofen.). 04/02/19   Earnstine Regal, PA-C    Family History Family History  Problem Relation Age of Onset  . Diabetes Mother   . Hypertension Father     Social History Social History   Tobacco Use  . Smoking status: Never Smoker  . Smokeless tobacco: Never Used  Substance Use Topics  . Alcohol use: No    Frequency: Never  . Drug use: No     Allergies   Patient has no known allergies.   Review of Systems Review of Systems  Constitutional: Positive for fatigue. Negative for fever.  HENT: Negative for congestion.   Respiratory: Negative for shortness of breath.   Cardiovascular: Negative for chest pain.  Gastrointestinal: Positive for abdominal pain.  Genitourinary: Negative for flank pain.  Musculoskeletal: Negative for back pain.  Skin: Positive for wound. Negative for rash.  Neurological: Positive for syncope.  Psychiatric/Behavioral: Negative for agitation.     Physical Exam Updated Vital Signs BP 122/79 (BP Location: Left Arm)   Pulse (!) 116   Temp 97.7 F (36.5 C) (Oral)   Resp 12   Ht 5\' 2"  (1.575 m)   Wt 78.9 kg   LMP 03/25/2019   SpO2 100%   BMI 31.83 kg/m   Physical Exam Vitals signs and nursing note reviewed.  HENT:     Head: Normocephalic.  Eyes:     General: No scleral icterus.    Pupils: Pupils are equal, round, and reactive to light.  Neck:     Musculoskeletal: Neck supple.  Cardiovascular:     Rate and Rhythm: Tachycardia present.   Pulmonary:     Breath sounds: No wheezing, rhonchi or rales.  Abdominal:     Comments: Well-healing surgical sites.  Mild abdominal tenderness consistent with postsurgical state.  Musculoskeletal:     Right lower leg: No edema.     Left lower leg: No edema.  Skin:    General: Skin is warm.     Capillary Refill: Capillary refill takes less than 2 seconds.  Neurological:     Mental Status: She is alert. Mental status is at baseline.  Psychiatric:        Mood and Affect: Mood normal.      ED Treatments / Results  Labs (all  labs ordered are listed, but only abnormal results are displayed) Labs Reviewed  LIPASE, BLOOD  COMPREHENSIVE METABOLIC PANEL  CBC  URINALYSIS, ROUTINE W REFLEX MICROSCOPIC  I-STAT BETA HCG BLOOD, ED ([facility], [facility], [facility] ONLY)    EKG EKG Interpretation  Date/Time:  Friday April 04 2019 10:19:51 EST Ventricular Rate:  107 PR Interval:    QRS Duration: 94 QT Interval:  358 QTC Calculation: 478 R Axis:   52 Text Interpretation: Sinus tachycardia Borderline T abnormalities, anterior leads Confirmed by Davonna Belling [phone]) on 04/04/2019 1:07:15 PM   Radiology No results found.  Procedures Procedures (including critical care time)  Medications Ordered in ED Medications  sodium chloride flush (NS) 0.9 % injection 3 mL (has no administration in time range)  sodium chloride 0.9 % bolus 1,000 mL (has no administration in time range)     Initial Impression / Assessment and Plan / ED Course  I have reviewed the triage vital signs and the nursing notes.  Pertinent labs & imaging results that were available during my care of the patient were reviewed by me and considered in my medical decision making (see chart for details).        Without that he could have a sinus before that he stayed a little longer than that came back and he had to deal with it patient with syncope.  Happened while on the toilet.  I think likely vasovagal since happened while  going to the bathroom.  Likely dehydration since decreased oral intake.  I think pulmonary embolism less likely.  White count mildly elevated but lab work reassuring.  Patient feels better.  Has not had all the fluid boluses due to delay of getting him started patient states she just wants to go home and drink fluids.  I think she is stable for discharge.  Heart rate appears to have come down.  Final Clinical Impressions(s) / ED Diagnoses   Final diagnoses:  None    ED Discharge Orders    None       Davonna Belling, MD 04/04/19 1550    Davonna Belling, MD 04/14/19 [id number]

## 2019-04-04 NOTE — ED Triage Notes (Signed)
Per EMS- Patient was discharged from the hospital 3 days ago following an appy and colon surgery. Patient states that she has had diarrhea since being discharged. Patient states while in the bathroom this AM she had LOC.  EMS reported that the initial BP was 88/58. Patient received 500 ml NS and BP increased to 135/100.

## 2019-04-04 NOTE — ED Notes (Signed)
Pt is alert and oriented x 4 and is verbally responsive. Denies pain at this time.

## 2019-04-04 NOTE — Discharge Instructions (Addendum)
Hydrate yourself at home.

## 2019-04-09 ENCOUNTER — Ambulatory Visit (INDEPENDENT_AMBULATORY_CARE_PROVIDER_SITE_OTHER): Payer: 59 | Admitting: Family Medicine

## 2019-04-09 ENCOUNTER — Other Ambulatory Visit: Payer: Self-pay

## 2019-04-09 VITALS — BP 102/60 | HR 90 | Ht 62.0 in | Wt 180.1 lb

## 2019-04-09 DIAGNOSIS — E876 Hypokalemia: Secondary | ICD-10-CM | POA: Diagnosis not present

## 2019-04-09 DIAGNOSIS — D734 Cyst of spleen: Secondary | ICD-10-CM

## 2019-04-09 DIAGNOSIS — D649 Anemia, unspecified: Secondary | ICD-10-CM | POA: Diagnosis not present

## 2019-04-09 NOTE — Patient Instructions (Signed)
It was great to see you!  Our plans for today:  - Your surgical sites look great and you seem to be doing fairly well from a symptom standpoint.  Continue to follow-up with surgery as scheduled. - We are checking some labs today, we will call you or send you a letter with these results.  - If you develop nausea, vomiting, fevers, abdominal distention or worsened pain, or return of vaginal bleeding, come back to be seen.  Take care and seek immediate care sooner if you develop any concerns.   Dr. [facility]

## 2019-04-09 NOTE — Progress Notes (Signed)
  Subjective:   Patient ID: Brooke Marks    DOB: 1980/10/13, 38 y.o. female   MRN: [id number]  Brooke Marks is a 38 y.o. female with a history of HTN, GERD, gestational DM, HSV, obesity, appendiceal mass s/p proximal right colectomy/RSO here for   Hospital follow-up -Admitted at [facility] 11/7-11/11 for 2 months history of intermittent RLQ abdominal pain.  CT A/P notable for dilated appendix with periappendiceal inflammation consistent with acute appendicitis.  Was taken to the OR 11/8 with findings of bulky phlegmon from the terminal ileum and cecum densely adherent to the RLQ lateral sidewall and retroperitoneum appeared to have appendiceal etiology with resultant removal of appendix and right colectomy with right salpingo-oophorectomy due to dense adhesions.  No obvious metastatic disease was present.  Surgical pathology without malignant etiology.  Patient had uncomplicated postop course. - Additionally, complex cystic lesion in the spleen visualized on CT, stable from 2019, felt to reflect benign process.  Recommended nonemergent outpatient MRI. - Was also seen at [facility] ED 11/13 for vasovagal syncope while on the toilet secondary to decreased p.o. intake. Thought to be due to dehydration, has been doing well since, no further episodes of syncope. - Currently patient doing well, reports pain well controlled - eating and drinking ok. - BMs soft, 1-2 per day - no N/V. - no discharge or redness of surgical site - no vaginal bleeding.  - has surgery f/u 11/30.  Review of Systems:  Per HPI.  Medications and smoking status reviewed.  Objective:   BP 102/60   Pulse 90   Ht 5\' 2"  (1.575 m)   Wt 180 lb 2 oz (81.7 kg)   LMP 03/25/2019   SpO2 96%   BMI 32.95 kg/m  Vitals and nursing note reviewed.  General: well nourished, well developed, in no acute distress with non-toxic appearance CV: regular rate and rhythm without murmurs, rubs, or gallops, no lower extremity edema Lungs: clear  to auscultation bilaterally with normal work of breathing Abdomen: soft, non-tender, non-distended, normoactive bowel sounds. Skin: warm, dry, no rashes or lesions. Surgical sites clean, dry, intact.  Assessment & Plan:   Hospital follow up Doing well without complications. Has surgery follow up 11/30. Will repeat BMP, CBC given previous anemia and hypokalemia. Return precautions discussed, see AVS for details. Will order nonemergent outpatient MRI to follow up on splenic cyst.  Orders Placed This Encounter  Procedures  . CBC  . Basic Metabolic Panel   No orders of the defined types were placed in this encounter.   Rory Percy, DO PGY-3, [facility] 04/11/2019 11:27 AM

## 2019-04-10 LAB — BASIC METABOLIC PANEL
BUN/Creatinine Ratio: 20 (ref 9–23)
BUN: 12 mg/dL (ref 6–20)
CO2: 26 mmol/L (ref 20–29)
Calcium: 9.1 mg/dL (ref 8.7–10.2)
Chloride: 104 mmol/L (ref 96–106)
Creatinine, Ser: 0.59 mg/dL (ref 0.57–1.00)
GFR calc Af Amer: 136 mL/min/{1.73_m2} (ref >59)
GFR calc non Af Amer: 118 mL/min/{1.73_m2} (ref >59)
Glucose: 93 mg/dL (ref 65–99)
Potassium: 3.9 mmol/L (ref 3.5–5.2)
Sodium: 141 mmol/L (ref 134–144)

## 2019-04-10 LAB — CBC
Hematocrit: 30.9 % — ABNORMAL LOW (ref 34.0–46.6)
Hemoglobin: 10.1 g/dL — ABNORMAL LOW (ref 11.1–15.9)
MCH: 25.3 pg — ABNORMAL LOW (ref 26.6–33.0)
MCHC: 32.7 g/dL (ref 31.5–35.7)
MCV: 77 fL — ABNORMAL LOW (ref 79–97)
Platelets: 445 10*3/uL (ref 150–450)
RBC: 3.99 x10E6/uL (ref 3.77–5.28)
RDW: 15.8 % — ABNORMAL HIGH (ref 11.7–15.4)
WBC: 6.9 10*3/uL (ref 3.4–10.8)

## 2019-04-21 ENCOUNTER — Telehealth: Payer: Self-pay | Admitting: Family Medicine

## 2019-04-21 NOTE — Telephone Encounter (Signed)
Pt is dropping off FMLA forms to be filled out. Paperwork put in blue folder. jw

## 2019-04-21 NOTE — Telephone Encounter (Signed)
Will forward to white team.  Brooke Marks

## 2019-04-22 NOTE — Telephone Encounter (Signed)
Clinical info completed on FMLA form.  Place form in Dr. Maudie Flakes box for completion.  Ottis Stain, CMA

## 2019-04-25 NOTE — Telephone Encounter (Signed)
Called and spoke with patient regarding her and her husband's FMLA needs and let her know that her FMLA paperwork should be available for pickup by 12/7.

## 2019-04-25 NOTE — Telephone Encounter (Signed)
PT is calling to check on the status of her FMLA paperwork being completed. She wanted to remind Dr. Shan Levans that the deadline is 04/30/19.  She would like it to be faxed to the number listed on the form.

## 2019-04-28 NOTE — Telephone Encounter (Signed)
Patient calling to see if her forms were faxed to the number on the paperwork she gave [place].  I did not see it up front or in the faxed out from 12/3 or 12/4.  Please call her to let her know the status of these forms.  Thanks.  [phone].

## 2019-04-28 NOTE — Telephone Encounter (Signed)
The form was placed in the RN folder on 12/4.  The patient marked on the form that they would pick it up and confirmed this with me on the phone on 12/4.  If there is a fax number listed on the form, we can certainly fax it.  Would you let me know if it is able to be faxed?  Thanks.

## 2019-04-29 NOTE — Telephone Encounter (Signed)
Forms faxed at patients request to [phone]. A copy was made for batch scanning and a copy left up front for pick up for her records. Patient aware.

## 2019-06-17 ENCOUNTER — Telehealth: Payer: Self-pay

## 2019-06-17 ENCOUNTER — Other Ambulatory Visit: Payer: Self-pay | Admitting: Family Medicine

## 2019-06-17 DIAGNOSIS — D734 Cyst of spleen: Secondary | ICD-10-CM

## 2019-06-17 NOTE — Telephone Encounter (Signed)
Hi White Team,  I changed the order for this patient. Could you call [facility] and let them know?  Thanks so much! Bryson Ha

## 2019-06-17 NOTE — Telephone Encounter (Signed)
Brooke Marks with [facility] calls nurse line regarding MRI abdomen order. Per Nonah Mattes, order needs to be placed for with contrast and without contrast. She states that they cannot do the test as the order is now.   Request MRI to be reordered as MRI Abdomen with and without contrast  Please advise  To PCP  Talbot Grumbling, RN

## 2019-06-18 NOTE — Telephone Encounter (Signed)
Contacted [facility] and spoke with Nonah Mattes and she stated that it is correct and they will reach out to the pt to get this scheduled.Annaclaire Walsworth Zimmerman Rumple, CMA

## 2019-07-14 ENCOUNTER — Inpatient Hospital Stay: Admission: RE | Admit: 2019-07-14 | Payer: 59 | Source: Ambulatory Visit

## 2019-11-13 IMAGING — CT CT ABD-PELV W/ CM
2 of 4 series · 15 of 46 positions shown, 17 images · IV contrast (omnipaque)
Comparison: None.

CLINICAL DATA: Abdominal pain.  Appendicitis suspected.

EXAM:
CT ABDOMEN AND PELVIS WITH CONTRAST
TECHNIQUE: Multidetector CT imaging of the abdomen and pelvis was performed
using the standard protocol following bolus administration of
intravenous contrast.
CONTRAST:  100mL OMNIPAQUE IOHEXOL 300 MG/ML  SOLN

[Series 2: axial st · axial · 0.67mm/px · z∈[-519,-129]mm · 12 of 88 slices shown, 14 images]
[im 5/88  soft-tissue]
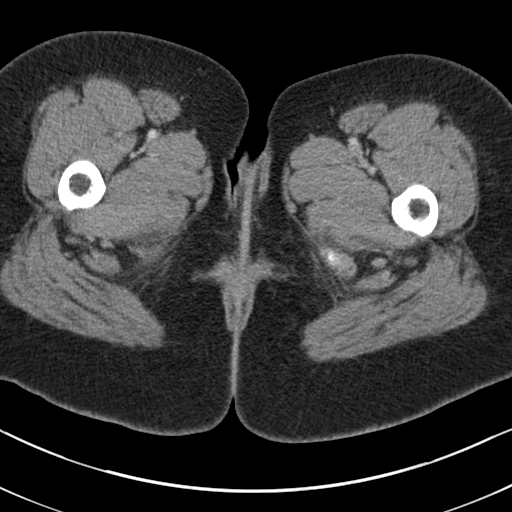
[im 5/88  bone]
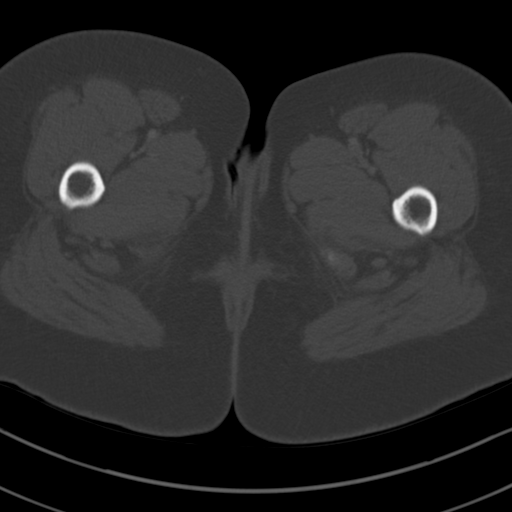
[im 15/88  soft-tissue]
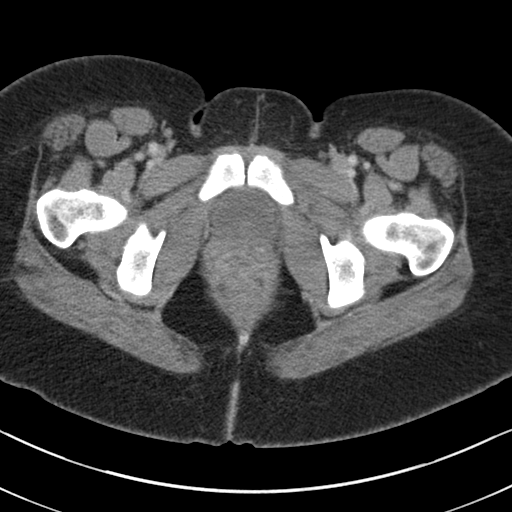
[im 20/88  soft-tissue]
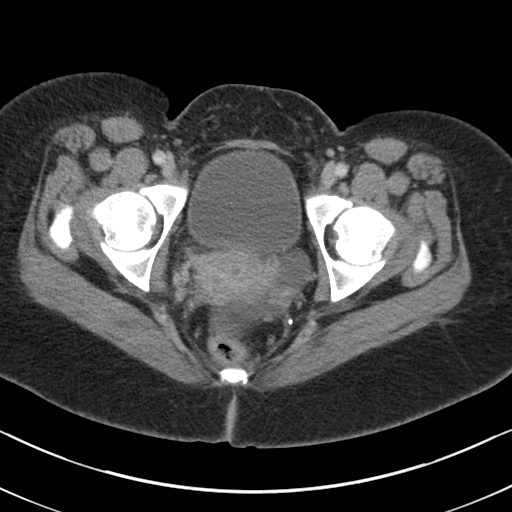
[im 25/88  soft-tissue]
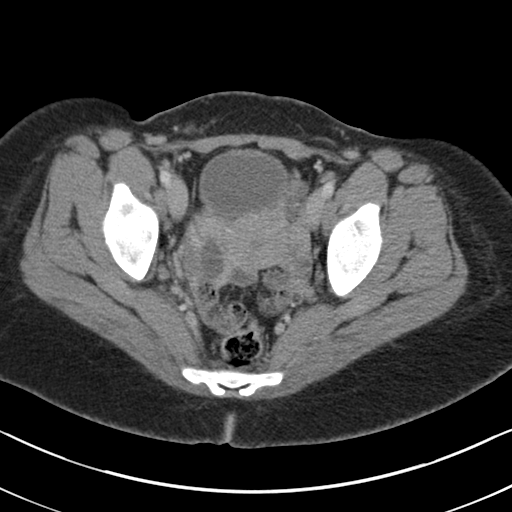
[im 34/88  soft-tissue]
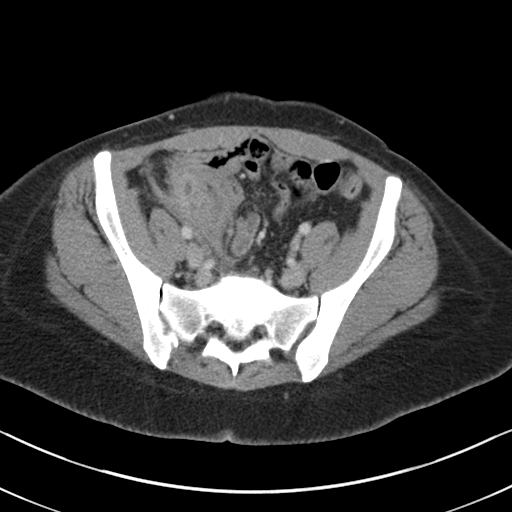
[im 39/88  soft-tissue]
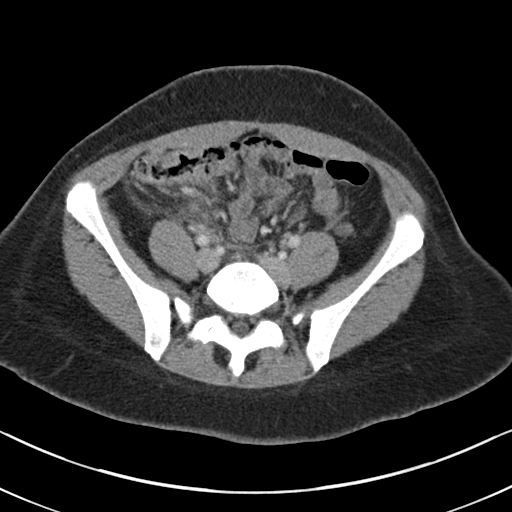
[im 49/88  soft-tissue]
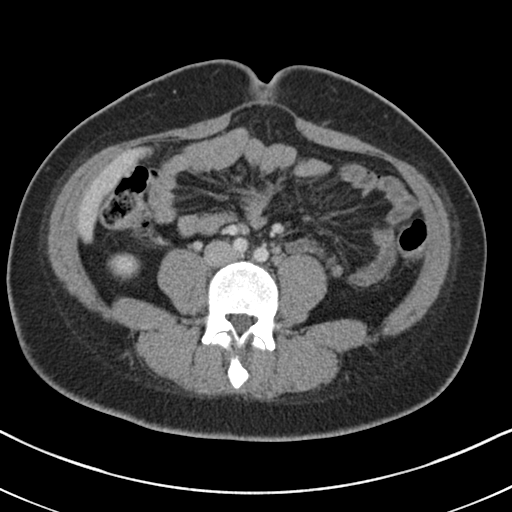
[im 54/88  soft-tissue]
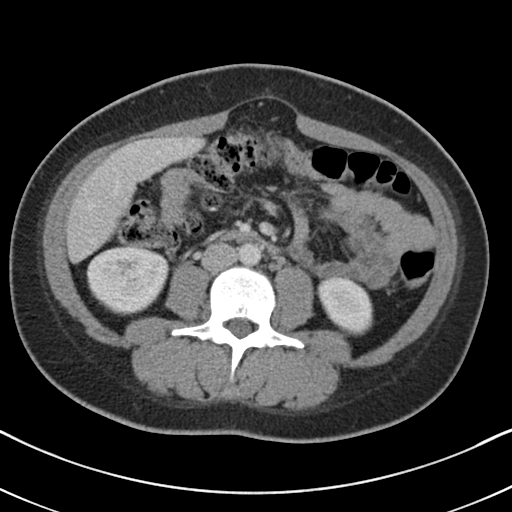
[im 63/88  soft-tissue]
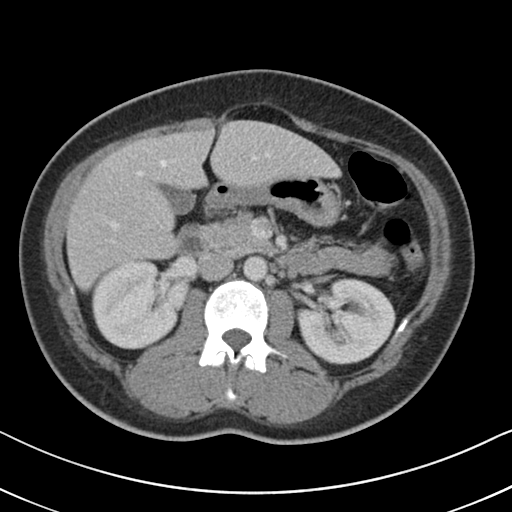
[im 63/88  bone]
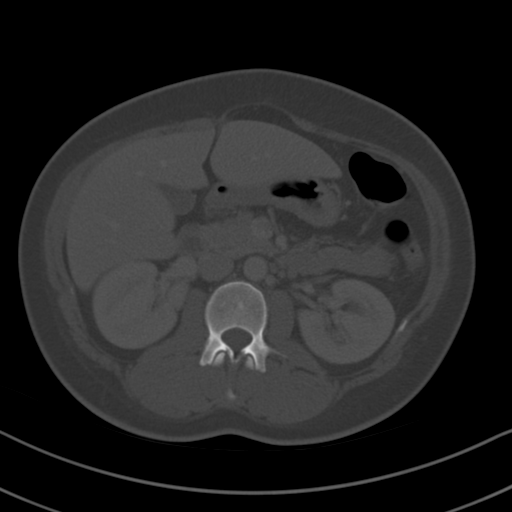
[im 68/88  soft-tissue]
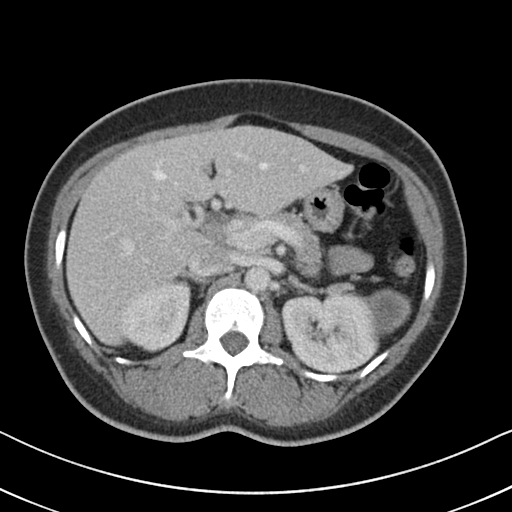
[im 73/88  soft-tissue]
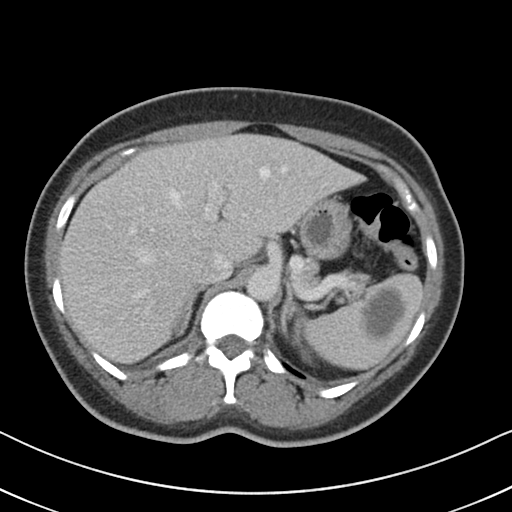
[im 83/88  soft-tissue]
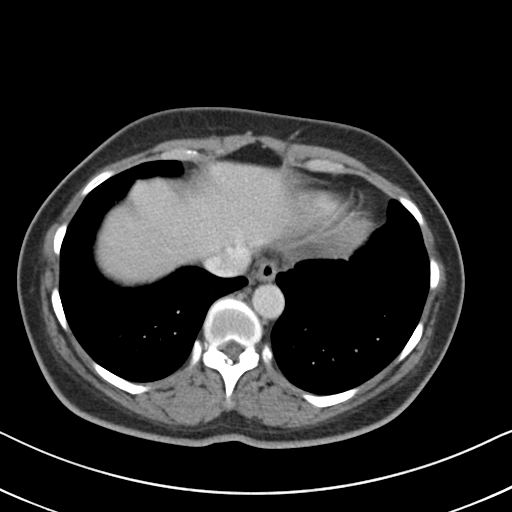

[Series 5: coronal st · coronal · 0.68mm/px · 3 of 138 slices shown]
[im 46/138  soft-tissue]
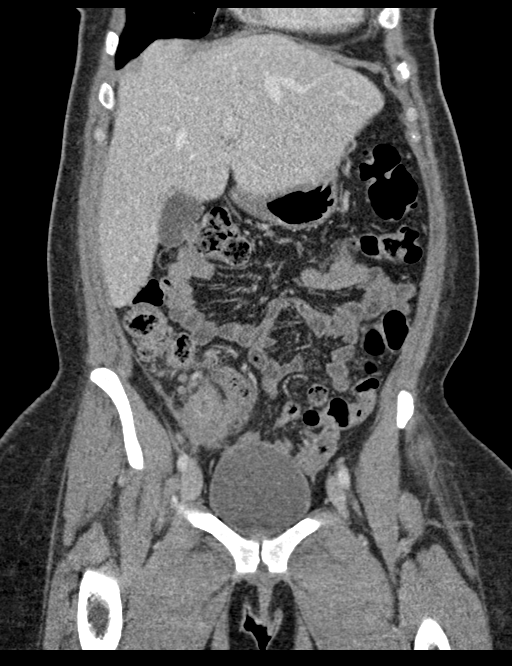
[im 61/138  soft-tissue]
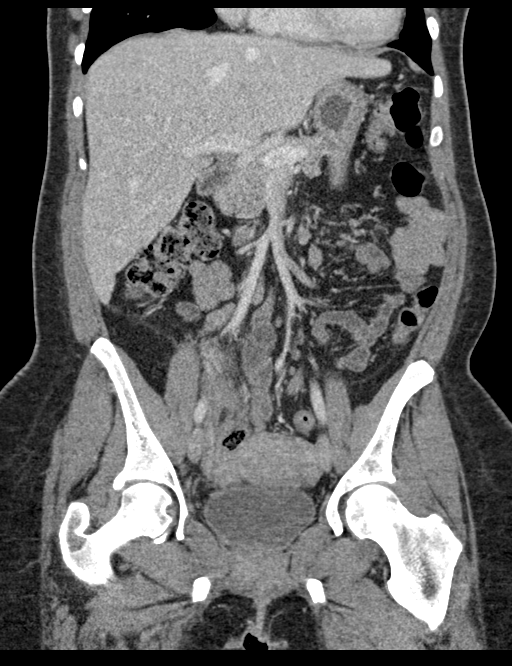
[im 77/138  soft-tissue]
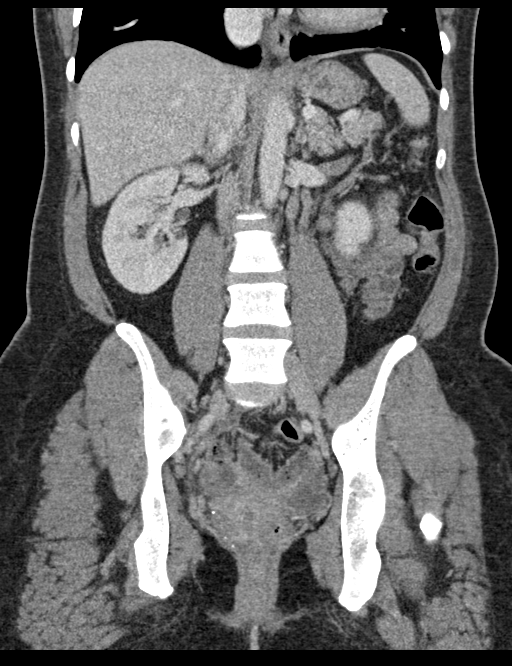

[15 of 46 positions shown; findings below may reference images not displayed]

FINDINGS: Lower chest: The lung bases are clear. The heart size is normal.

Hepatobiliary: The liver is normal. Normal gallbladder.There is no
biliary ductal dilation.

Pancreas: Normal contours without ductal dilatation. No
peripancreatic fluid collection.

Spleen: There is a slightly complex 4.2 cm hypoattenuating mass in
the lower pole the spleen. The margins are irregular, especially on
the coronal series.

Adrenals/Urinary Tract:

--Adrenal glands: No adrenal hemorrhage.

--Right kidney/ureter: No hydronephrosis or perinephric hematoma.

--Left kidney/ureter: No hydronephrosis or perinephric hematoma.

--Urinary bladder: Unremarkable.

Stomach/Bowel:

--Stomach/Duodenum: No hiatal hernia or other gastric abnormality.
Normal duodenal course and caliber.

--Small bowel: No dilatation or inflammation.

--Colon: No focal abnormality.

--Appendix: The appendix is significantly dilated. There are
extensive inflammatory changes in the right lower quadrant. There is
no free air. There is a small amount of reactive free fluid in the
pelvis.

Vascular/Lymphatic: Normal course and caliber of the major abdominal
vessels.

--No retroperitoneal lymphadenopathy.

--No mesenteric lymphadenopathy.

--No pelvic or inguinal lymphadenopathy.

Reproductive: Unremarkable

Other: No ascites or free air. The abdominal wall is normal.

Musculoskeletal. No acute displaced fractures.
IMPRESSION: 1. Extensive inflammatory changes in the right lower quadrant with a
significantly dilated appendix. Findings are consistent with acute
appendicitis. While there is no free air, perforation is not
entirely excluded. There is no abscess.
2. Small amount of reactive free fluid in the pelvis.
3. Complex cystic lesion in the spleen as detailed above. This is
essentially stable from prior study in 9449 and is favored to
represent a benign process. However, it remains indeterminate.
Follow-up with a nonemergent outpatient MRI is recommended for
further evaluation of this finding.

## 2020-01-13 ENCOUNTER — Other Ambulatory Visit: Payer: Self-pay

## 2020-01-13 ENCOUNTER — Telehealth: Payer: Self-pay

## 2020-01-13 ENCOUNTER — Ambulatory Visit (INDEPENDENT_AMBULATORY_CARE_PROVIDER_SITE_OTHER): Payer: 59 | Admitting: Family Medicine

## 2020-01-13 VITALS — BP 126/75 | HR 124 | Ht 62.0 in | Wt 184.8 lb

## 2020-01-13 DIAGNOSIS — R519 Headache, unspecified: Secondary | ICD-10-CM | POA: Diagnosis not present

## 2020-01-13 DIAGNOSIS — Z3200 Encounter for pregnancy test, result unknown: Secondary | ICD-10-CM | POA: Diagnosis not present

## 2020-01-13 DIAGNOSIS — Z23 Encounter for immunization: Secondary | ICD-10-CM | POA: Diagnosis not present

## 2020-01-13 LAB — POCT URINE PREGNANCY: Preg Test, Ur: NEGATIVE

## 2020-01-13 MED ORDER — NAPROXEN SODIUM 550 MG PO TABS: ORAL_TABLET | ORAL | 1 refills | Status: DC

## 2020-01-13 NOTE — Progress Notes (Signed)
    SUBJECTIVE:   CHIEF COMPLAINT / HPI:   Headache: Patient developed a headache last Wednesday night (almost 1 week ago).  It became worse the next day.  Patient took her blood pressure and it was 143/93.  She did not have a headache on Friday, but the headache returned Saturday morning.  When she woke up on Sunday her headache had resolved.  On Monday the headache returned and has not left since.  She describes the headache as bilateral frontal wrapping around the back of her head.  Also over her nose and jaw.  It is a throbbing headache.  No rhinorrhea, no nasal congestion.  She endorses mild photophobia, especially with her computer screen.  Also endorsing mild phonophobia.  She has nausea with the headaches, but has not had any vomiting.  No increased pain when she chews.  Patient started taking her amlodipine again Monday night because she thought the headache could be due to the blood pressure.  She also took Excedrin last night and she says this helped a little bit and allowed to go to sleep.  Patient had a surgery for severe endometriosis.  She still has at least one of her ovaries and her uterus and still gets menstrual cycles.  She says she has had headaches in the past with her menstrual cycles, but they have never lasted this long or been this severe.  PERTINENT  PMH / PSH: PMDD  OBJECTIVE:   BP 126/75   Pulse (!) 124   Ht 5\' 2"  (1.575 m)   Wt 184 lb 12.8 oz (83.8 kg)   LMP 12/19/2019 (Approximate)   SpO2 97%   BMI 33.80 kg/m   General: Alert and oriented.  No acute distress. HEENT: PERRLA, EOMI, no rhinorrhea, no lacrimation, no nasal congestion, moist oral mucosa.  No scalp tenderness.  No tenderness with palpation of the TMJ joint. CV: Regular rate and rhythm, no murmurs. Pulmonary: Lungs clear to auscultation bilaterally, no wheezes. Neuro: Cranial nerves II through XII grossly intact.  ASSESSMENT/PLAN:   Headache Patient has 1 week of headache that comes and goes.   Headache symptoms are consistent with both tension headache (bilateral frontal and wrapping around to the occipital region) and migraine (photophobia/phonophobia/nausea).  Unlikely to be autonomic cephalgia type or cervicogenic based on lack of corroborating physical exam findings and history.  Discussed with patient various options including initially treating with naproxen (as this is effective for both migraines and tension type headache) and contacting our clinic if that does not work to initiate migraine therapy with triptan versus starting triptan now.  Decision was made to try naproxen first.  Advised patient to call our office in the next few days if naproxen does not help alleviate symptoms.     Benay Pike, MD [place]

## 2020-01-13 NOTE — Telephone Encounter (Signed)
Patient calls nurse line regarding medication being sent to incorrect pharmacy. Patient needs medication sent to [facility] on [address]. Called and cancelled rx at [facility] (spoke with Latoya). Will resend to correct pharmacy.  Talbot Grumbling, RN

## 2020-01-13 NOTE — Patient Instructions (Addendum)
It was nice to meet you today,  I have written a prescription for naproxen. I would like you to take this once when you have a headache, then wait 1 hour and if you still have a headache take an additional tablet. Do not take more than 2 tablets a day. When taking this medicine do not also take ibuprofen, Advil, aspirin. You can also take Tylenol with this medication if that helps.  Try this medication and if it does not help in the next couple days, let us know and I can prescribe you a different medication specifically for migraines.  You can continue to take the amlodipine.  Please schedule appointment with your PCP, Dr. Caron Presume, in the next 1 to 2 weeks.  Have a great day,  Clemetine Marker, MD

## 2020-01-14 ENCOUNTER — Other Ambulatory Visit: Payer: Self-pay

## 2020-01-14 ENCOUNTER — Encounter (HOSPITAL_COMMUNITY): Payer: Self-pay | Admitting: Emergency Medicine

## 2020-01-14 ENCOUNTER — Emergency Department (HOSPITAL_COMMUNITY)
Admission: EM | Admit: 2020-01-14 | Discharge: 2020-01-14 | Disposition: A | Discharge status: Home / Self Care | Payer: 59 | Attending: Emergency Medicine | Admitting: Emergency Medicine

## 2020-01-14 DIAGNOSIS — R519 Headache, unspecified: Secondary | ICD-10-CM | POA: Diagnosis present

## 2020-01-14 DIAGNOSIS — Z5321 Procedure and treatment not carried out due to patient leaving prior to being seen by health care provider: Secondary | ICD-10-CM | POA: Diagnosis not present

## 2020-01-14 NOTE — ED Triage Notes (Signed)
Pt states she has had headache x 1 week. Seen at [facility] yesterday and was given naproxen but it has not helped. Alert and oriented. Neuro intact.

## 2020-01-15 ENCOUNTER — Ambulatory Visit (INDEPENDENT_AMBULATORY_CARE_PROVIDER_SITE_OTHER): Payer: 59 | Admitting: Family Medicine

## 2020-01-15 ENCOUNTER — Encounter: Payer: Self-pay | Admitting: Family Medicine

## 2020-01-15 VITALS — BP 120/80 | Wt 186.5 lb

## 2020-01-15 DIAGNOSIS — R519 Headache, unspecified: Secondary | ICD-10-CM

## 2020-01-15 DIAGNOSIS — G43709 Chronic migraine without aura, not intractable, without status migrainosus: Secondary | ICD-10-CM | POA: Diagnosis not present

## 2020-01-15 MED ORDER — SUMATRIPTAN SUCCINATE 50 MG PO TABS: 50.0000 mg | ORAL_TABLET | ORAL | 1 refills | Status: DC | PRN

## 2020-01-15 MED ORDER — SUMATRIPTAN SUCCINATE 6 MG/0.5ML ~~LOC~~ SOLN
6.0000 mg | Freq: Once | SUBCUTANEOUS | Status: AC
  Administered 2020-01-15: 6 mg via SUBCUTANEOUS

## 2020-01-15 NOTE — Patient Instructions (Signed)
It was a pleasure to meet you today! I hope your headache improves.  1. You received an injection of imitrex (sumatriptan) today. If this helps your headache, you may pick up sumatriptan pills at your pharmacy ([facility] [address] You may take one every 2 hours as needed for your headache. Do not take more than 4 in one day.  2. Follow up on 9/1 at 8:30 AM for your migraines  3. If you notice a change in speech, a change in vision, change in sensation of your body (cannot tell if your arm is being touched), or problems moving or speaking, please go to the emergency department.  Feel better!  Dr. Chauncey Reading   Migraine Headache A migraine headache is an intense, throbbing pain on one side or both sides of the head. Migraine headaches may also cause other symptoms, such as nausea, vomiting, and sensitivity to light and noise. A migraine headache can last from 4 hours to 3 days. Talk with your doctor about what things may bring on (trigger) your migraine headaches. What are the causes? The exact cause of this condition is not known. However, a migraine may be caused when nerves in the brain become irritated and release chemicals that cause inflammation of blood vessels. This inflammation causes pain. This condition may be triggered or caused by:  Drinking alcohol.  Smoking.  Taking medicines, such as: ? Medicine used to treat chest pain (nitroglycerin). ? Birth control pills. ? Estrogen. ? Certain blood pressure medicines.  Eating or drinking products that contain nitrates, glutamate, aspartame, or tyramine. Aged cheeses, chocolate, or caffeine may also be triggers.  Doing physical activity. Other things that may trigger a migraine headache include:  Menstruation.  Pregnancy.  Hunger.  Stress.  Lack of sleep or too much sleep.  Weather changes.  Fatigue. What increases the risk? The following factors may make you more likely to experience migraine headaches:  Being a  certain age. This condition is more common in people who are 67-41 years old.  Being female.  Having a family history of migraine headaches.  Being Caucasian.  Having a mental health condition, such as depression or anxiety.  Being obese. What are the signs or symptoms? The main symptom of this condition is pulsating or throbbing pain. This pain may:  Happen in any area of the head, such as on one side or both sides.  Interfere with daily activities.  Get worse with physical activity.  Get worse with exposure to bright lights or loud noises. Other symptoms may include:  Nausea.  Vomiting.  Dizziness.  General sensitivity to bright lights, loud noises, or smells. Before you get a migraine headache, you may get warning signs (an aura). An aura may include:  Seeing flashing lights or having blind spots.  Seeing bright spots, halos, or zigzag lines.  Having tunnel vision or blurred vision.  Having numbness or a tingling feeling.  Having trouble talking.  Having muscle weakness. Some people have symptoms after a migraine headache (postdromal phase), such as:  Feeling tired.  Difficulty concentrating. How is this diagnosed? A migraine headache can be diagnosed based on:  Your symptoms.  A physical exam.  Tests, such as: ? CT scan or an MRI of the head. These imaging tests can help rule out other causes of headaches. ? Taking fluid from the spine (lumbar puncture) and analyzing it (cerebrospinal fluid analysis, or CSF analysis). How is this treated? This condition may be treated with medicines that:  Relieve  pain.  Relieve nausea.  Prevent migraine headaches. Treatment for this condition may also include:  Acupuncture.  Lifestyle changes like avoiding foods that trigger migraine headaches.  Biofeedback.  Cognitive behavioral therapy. Follow these instructions at home: Medicines  Take over-the-counter and prescription medicines only as told by  your health care provider.  Ask your health care provider if the medicine prescribed to you: ? Requires you to avoid driving or using heavy machinery. ? Can cause constipation. You may need to take these actions to prevent or treat constipation:  Drink enough fluid to keep your urine pale yellow.  Take over-the-counter or prescription medicines.  Eat foods that are high in fiber, such as beans, whole grains, and fresh fruits and vegetables.  Limit foods that are high in fat and processed sugars, such as fried or sweet foods. Lifestyle  Do not drink alcohol.  Do not use any products that contain nicotine or tobacco, such as cigarettes, e-cigarettes, and chewing tobacco. If you need help quitting, ask your health care provider.  Get at least 8 hours of sleep every night.  Find ways to manage stress, such as meditation, deep breathing, or yoga. General instructions      Keep a journal to find out what may trigger your migraine headaches. For example, write down: ? What you eat and drink. ? How much sleep you get. ? Any change to your diet or medicines.  If you have a migraine headache: ? Avoid things that make your symptoms worse, such as bright lights. ? It may help to lie down in a dark, quiet room. ? Do not drive or use heavy machinery. ? Ask your health care provider what activities are safe for you while you are experiencing symptoms.  Keep all follow-up visits as told by your health care provider. This is important. Contact a health care provider if:  You develop symptoms that are different or more severe than your usual migraine headache symptoms.  You have more than 15 headache days in one month. Get help right away if:  Your migraine headache becomes severe.  Your migraine headache lasts longer than 72 hours.  You have a fever.  You have a stiff neck.  You have vision loss.  Your muscles feel weak or like you cannot control them.  You start to lose your  balance often.  You have trouble walking.  You faint.  You have a seizure. Summary  A migraine headache is an intense, throbbing pain on one side or both sides of the head. Migraines may also cause other symptoms, such as nausea, vomiting, and sensitivity to light and noise.  This condition may be treated with medicines and lifestyle changes. You may also need to avoid certain things that trigger a migraine headache.  Keep a journal to find out what may trigger your migraine headaches.  Contact your health care provider if you have more than 15 headache days in a month or you develop symptoms that are different or more severe than your usual migraine headache symptoms. This information is not intended to replace advice given to you by your health care provider. Make sure you discuss any questions you have with your health care provider. Document Revised: 08/30/2018 Document Reviewed: 06/20/2018 Elsevier Patient Education  [place].

## 2020-01-15 NOTE — Progress Notes (Signed)
    SUBJECTIVE:   CHIEF COMPLAINT / HPI: headache f/u  Headache: patient has a HA that initially began 1 week ago on Wednesday. She noticed that this was more intense and prolonged than previous HA's. She does not have a h/o migraines prior to this. The HA tends to start from back to front and comes on slowly, but becomes very intense with throbbing pain that is global and bilateral, but she notices it the worse behind her left eye and "in [her] left ear." That HA lasted from Wednesday to Friday, then returned on Sunday night and lasted till Tuesday, and returned again yesterday. She reports having changes in vision, sees colors prior to HA starting, then her vision returns to normal, though she has photophobia. She has had some nausea, no vomiting. Has tried excedrin, tylenol, and naproxen without improvement. Lying down and resting in a dark room helps. She denies thunderclap onset, no problems moving or speaking.   PERTINENT  PMH / PSH: HTN  OBJECTIVE:   BP 120/80   Wt 84.6 kg   LMP 12/19/2019 (Approximate)   BMI 34.11 kg/m   Physical Exam Vitals and nursing note reviewed.  Constitutional:      General: She is not in acute distress.    Appearance: Normal appearance. She is obese. She is not ill-appearing, toxic-appearing or diaphoretic.  HENT:     Head: Normocephalic and atraumatic.     Right Ear: Tympanic membrane, ear canal and external ear normal.     Left Ear: Tympanic membrane, ear canal and external ear normal.  Neck:     Vascular: No carotid bruit.  Musculoskeletal:     Cervical back: No tenderness.  Lymphadenopathy:     Cervical: No cervical adenopathy.  Skin:    General: Skin is warm and dry.  Neurological:     General: No focal deficit present.     Mental Status: She is alert. Mental status is at baseline.     Comments: Cranial Nerves: II: Visual Fields are full.PERRL. III,IV, VI: EOMI without ptosis or diplopia.  V: Facial sensation is symmetric to  temperature VII: Facial movement is symmetric.  VIII: hearing is intact to voice X: Palate elevates symmetrically XI: Shoulder shrug is symmetric. XII: tongue is midline without atrophy or fasciculations.  Motor: Tone is normal. Bulk is normal. 5/5 strength was present in all four extremities.  Sensory: Sensation is symmetric to light touch in the arms and legs. Deep Tendon Reflexes: 2+ and symmetric in the biceps and patellae.    Psychiatric:        Mood and Affect: Mood normal.        Behavior: Behavior normal.    ASSESSMENT/PLAN:   Headache New intermittent HA that comes on daily and lasts 48-72 hours with throbbing quality, aura, photophobia, and nausea. Most likely this is a migraine HA, other considerations were shingles, trigeminal neuralgia, cluster HA, stroke, or other intracranial mass. Ears were unremarkable, no rash on skin, not tender to palpation, no red flag sx, no focal neuro deficits, and hx + demographics are most c/w migraine. - sumatriptan 6mg  [place] injection given in clinic with some relief - sumatriptain 50 mg x30 pills q2h prn sent to pharmacy, patient can pick up if sumatriptan injection helps - return precautions given, see AVS - f/u in 1 week, can consider migraine ppx     Gladys Damme, MD [place]

## 2020-01-15 NOTE — Assessment & Plan Note (Signed)
New intermittent HA that comes on daily and lasts 48-72 hours with throbbing quality, aura, photophobia, and nausea. Most likely this is a migraine HA, other considerations were shingles, trigeminal neuralgia, cluster HA, stroke, or other intracranial mass. Ears were unremarkable, no rash on skin, not tender to palpation, no red flag sx, no focal neuro deficits, and hx + demographics are most c/w migraine. - sumatriptan 6mg  [place] injection given in clinic with some relief - sumatriptain 50 mg x30 pills q2h prn sent to pharmacy, patient can pick up if sumatriptan injection helps - return precautions given, see AVS - f/u in 1 week, can consider migraine ppx

## 2020-01-16 NOTE — Assessment & Plan Note (Signed)
Patient has 1 week of headache that comes and goes.  Headache symptoms are consistent with both tension headache (bilateral frontal and wrapping around to the occipital region) and migraine (photophobia/phonophobia/nausea).  Unlikely to be autonomic cephalgia type or cervicogenic based on lack of corroborating physical exam findings and history.  Discussed with patient various options including initially treating with naproxen (as this is effective for both migraines and tension type headache) and contacting our clinic if that does not work to initiate migraine therapy with triptan versus starting triptan now.  Decision was made to try naproxen first.  Advised patient to call our office in the next few days if naproxen does not help alleviate symptoms.

## 2020-01-21 ENCOUNTER — Ambulatory Visit: Payer: 59 | Admitting: Family Medicine

## 2020-02-03 ENCOUNTER — Other Ambulatory Visit (HOSPITAL_COMMUNITY)
Admission: RE | Admit: 2020-02-03 | Discharge: 2020-02-03 | Disposition: A | Discharge status: Home / Self Care | Payer: 59 | Source: Ambulatory Visit | Attending: Family Medicine | Admitting: Family Medicine

## 2020-02-03 ENCOUNTER — Ambulatory Visit (INDEPENDENT_AMBULATORY_CARE_PROVIDER_SITE_OTHER): Payer: 59 | Admitting: Family Medicine

## 2020-02-03 ENCOUNTER — Other Ambulatory Visit: Payer: Self-pay

## 2020-02-03 ENCOUNTER — Encounter: Payer: Self-pay | Admitting: Family Medicine

## 2020-02-03 VITALS — BP 118/78 | HR 79 | Ht 62.0 in | Wt 186.4 lb

## 2020-02-03 DIAGNOSIS — Z0000 Encounter for general adult medical examination without abnormal findings: Secondary | ICD-10-CM

## 2020-02-03 DIAGNOSIS — Z124 Encounter for screening for malignant neoplasm of cervix: Secondary | ICD-10-CM | POA: Diagnosis present

## 2020-02-03 DIAGNOSIS — Z23 Encounter for immunization: Secondary | ICD-10-CM

## 2020-02-03 DIAGNOSIS — Z1159 Encounter for screening for other viral diseases: Secondary | ICD-10-CM | POA: Diagnosis not present

## 2020-02-03 DIAGNOSIS — I10 Essential (primary) hypertension: Secondary | ICD-10-CM | POA: Diagnosis not present

## 2020-02-03 NOTE — Progress Notes (Signed)
SUBJECTIVE:   Chief compliant/HPI: annual examination  Brooke Marks is a 39 y.o. who presents today for an annual exam.   Review of systems form notable for headaches, negative for all other review of system.   Updated history tabs and problem list.  OBJECTIVE:   BP 118/78    Pulse 79    Ht _0  (1.575 m)    Wt 186 lb 6.4 oz (84.6 kg)    LMP 01/21/2020    SpO2 98%    BMI 34.09 kg/m   Physical Exam Vitals reviewed. Exam conducted with a chaperone present.  Constitutional:      General: She is not in acute distress.    Appearance: Normal appearance. She is obese. She is not ill-appearing.  HENT:     Head: Normocephalic and atraumatic.     Right Ear: Tympanic membrane normal.     Left Ear: Tympanic membrane normal.     Nose: Nose normal. No congestion or rhinorrhea.     Mouth/Throat:     Mouth: Mucous membranes are moist.     Pharynx: No oropharyngeal exudate or posterior oropharyngeal erythema.  Eyes:     Extraocular Movements: Extraocular movements intact.     Pupils: Pupils are equal, round, and reactive to light.  Cardiovascular:     Rate and Rhythm: Normal rate and regular rhythm.     Pulses: Normal pulses.     Heart sounds: Normal heart sounds.  Pulmonary:     Effort: Pulmonary effort is normal.     Breath sounds: Normal breath sounds.  Abdominal:     General: Abdomen is flat. Bowel sounds are normal.     Palpations: Abdomen is soft.     Tenderness: There is no abdominal tenderness.  Genitourinary:    General: Normal vulva.     Vagina: Normal.     Cervix: No friability, lesion or erythema.     Adnexa: Right adnexa normal and left adnexa normal.  Musculoskeletal:        General: Normal range of motion.     Cervical back: Normal range of motion and neck supple.  Skin:    General: Skin is warm and dry.     Capillary Refill: Capillary refill takes less than 2 seconds.     Findings: No rash.  Neurological:     General: No focal deficit present.      Mental Status: She is alert.      ASSESSMENT/PLAN:   No problem-specific Assessment & Plan notes found for this encounter.    Annual Examination  See AVS for age appropriate recommendations.   PHQ score 0, reviewed and discussed. Blood pressure reviewed and at goal .  Asked about intimate partner violence and patient reports no concerns.  The patient currently uses  for contraception. Folate recommended as appropriate, minimum of 400 mcg per day.     Considered the following items based upon USPSTF recommendations: HIV testing: already completed Hepatitis C: ordered Hepatitis B: already completed  Syphilis if at high risk: discussed GC/CT not at high risk and not ordered. Lipid panel (nonfasting or fasting) discussed based upon AHA recommendations and ordered.  Consider repeat every 4-6 years.  Reviewed risk factors for latent tuberculosis and not indicated  Discussed family history, BRCA testing not indicated. Cervical cancer screening: Overdue for Pap, completed today and cotesting ordered Immunizations Received 2nd Covid vaccine today. Declined flu.    Follow up in 1 year or sooner if indicated.  Gifford Shave, MD [place]

## 2020-02-03 NOTE — Patient Instructions (Signed)
It was a pleasure to meet you today.  I found nothing concerning from your physical exam.  We also completed a Pap smear today and those results will come back and I will post him to your MyChart.  You are also collecting some lab work and I will call you if there are any abnormalities but if they are normal I will post him to your MyChart.  If you have any questions or concerns please feel free to call the clinic.  We will see you again in 1 year for another annual physical or sooner if needed.  I hope you have a wonderful afternoon!

## 2020-02-04 LAB — HEPATITIS C ANTIBODY: Hep C Virus Ab: <0.1 s/co ratio (ref 0.0–0.9)

## 2020-02-05 LAB — CYTOLOGY - PAP
Comment: NEGATIVE
Diagnosis: NEGATIVE
High risk HPV: NEGATIVE

## 2020-02-16 ENCOUNTER — Other Ambulatory Visit: Payer: Self-pay | Admitting: Family Medicine

## 2020-02-16 DIAGNOSIS — I10 Essential (primary) hypertension: Secondary | ICD-10-CM

## 2020-08-03 ENCOUNTER — Ambulatory Visit: Payer: 59

## 2020-08-05 ENCOUNTER — Ambulatory Visit (INDEPENDENT_AMBULATORY_CARE_PROVIDER_SITE_OTHER): Payer: Self-pay

## 2020-08-05 ENCOUNTER — Other Ambulatory Visit: Payer: Self-pay

## 2020-08-05 DIAGNOSIS — Z23 Encounter for immunization: Secondary | ICD-10-CM

## 2020-09-08 ENCOUNTER — Ambulatory Visit: Payer: Self-pay | Admitting: Student in an Organized Health Care Education/Training Program

## 2020-09-15 ENCOUNTER — Encounter: Payer: Self-pay | Admitting: Student in an Organized Health Care Education/Training Program

## 2020-09-15 ENCOUNTER — Other Ambulatory Visit: Payer: Self-pay

## 2020-09-15 ENCOUNTER — Ambulatory Visit (INDEPENDENT_AMBULATORY_CARE_PROVIDER_SITE_OTHER): Payer: Self-pay | Admitting: Student in an Organized Health Care Education/Training Program

## 2020-09-15 DIAGNOSIS — F4321 Adjustment disorder with depressed mood: Secondary | ICD-10-CM

## 2020-09-15 MED ORDER — CITALOPRAM HYDROBROMIDE 20 MG PO TABS: 20.0000 mg | ORAL_TABLET | Freq: Every day | ORAL | 0 refills | Status: DC

## 2020-09-15 MED ORDER — ALPRAZOLAM 0.25 MG PO TABS: 0.2500 mg | ORAL_TABLET | Freq: Every day | ORAL | 0 refills | Status: DC | PRN

## 2020-09-15 NOTE — Progress Notes (Addendum)
   SUBJECTIVE:   CHIEF COMPLAINT / HPI: anxiety  Anxiety/acute grief- patient endorses chronic history of anxiety/depression not on treatment. The anniversary of her fathers passing was a few days ago and her mother passed away the next day. She had been ill from covid for months and passed away in the hospital here. [place] endorses having a panic attack on her way to appointment today. She is seeking help with medication and counseling. She is fearful that something will happen to her and leave her daughter without a mother if she does not get treatment now. She denies SI or self-harm thoughts.  She feels that she has a good support system with her husband and daughter.  She is staying active as she has to take care of her young daughter. But she is frequently having panic attacks and crying most of the time since the passing of her mother. Sleep has been difficult. Appetite increased. She has had to take time off work as she is too distressed over the passing of her mother.  OBJECTIVE:   BP 122/74   Pulse 100 Comment: nails too long  Ht 5\' 2"  (1.575 m)   Wt 184 lb 9.6 oz (83.7 kg)   LMP 09/09/2020   SpO2 97% Comment: nails too long  BMI 33.76 kg/m   Physical Exam Vitals and nursing note reviewed.  Constitutional:      General: She is in acute distress (due to emotional distress).     Appearance: She is not ill-appearing or toxic-appearing.  HENT:     Head: Normocephalic.  Cardiovascular:     Rate and Rhythm: Normal rate and regular rhythm.  Pulmonary:     Effort: Pulmonary effort is normal.     Breath sounds: Normal breath sounds.  Neurological:     Mental Status: She is alert.  Psychiatric:        Attention and Perception: Attention normal.        Mood and Affect: Mood is depressed. Affect is tearful.        Speech: Speech normal.        Behavior: Behavior is cooperative.        Thought Content: Thought content does not include suicidal ideation. Thought content does not  include suicidal plan.    ASSESSMENT/PLAN:   Grief reaction Patient has acute on chronic depression/anxiety related to recent passing of her mother. She is seeking medication as well as counseling assistance.  Dr. Hartford Poli was able to come for a warm handoff and set up a counseling session. Started on citalopram.  Very short course of xanax was provided and discussed with patient this is only a temporary measure and not long term beneficial. She has tolerated well in the past when her father passed away. F/u with PCP in 2 weeks Hotline information provided     Richarda Osmond, [place]

## 2020-09-15 NOTE — Patient Instructions (Signed)
It was a pleasure to meet you, To summarize our discussion for this visit:  I'm sorry for your loss and am glad that you're coming in for help.  Dr. Hartford Poli will be able to help with counseling  I'm sending in a depression medication to take daily and then a few xanax to take as needed for panic attacks but this is only a short term medication.   Please come back in 2 weeks or less to see [place] so we can monitor how everything is working for you   Call the clinic at [phone] if your symptoms worsen or you have any concerns.   Thank you for allowing me to take part in your care,  Dr. Doristine Mango

## 2020-09-15 NOTE — Progress Notes (Signed)
Engaged in quick warm-hand off to introduce myself to pt.  Pt deined SI   Schedule pt for 5/5 at 2pm

## 2020-09-16 DIAGNOSIS — F4320 Adjustment disorder, unspecified: Secondary | ICD-10-CM | POA: Insufficient documentation

## 2020-09-16 DIAGNOSIS — F4321 Adjustment disorder with depressed mood: Secondary | ICD-10-CM | POA: Insufficient documentation

## 2020-09-16 NOTE — Assessment & Plan Note (Addendum)
Patient has acute on chronic depression/anxiety related to recent passing of her mother. She is seeking medication as well as counseling assistance.  Dr. Hartford Poli was able to come for a warm handoff and set up a counseling session. Started on citalopram.  Very short course of xanax was provided and discussed with patient this is only a temporary measure and not long term beneficial. She has tolerated well in the past when her father passed away. F/u with PCP in 2 weeks Hotline information provided

## 2020-09-22 ENCOUNTER — Telehealth: Payer: Self-pay | Admitting: Family Medicine

## 2020-09-22 NOTE — Telephone Encounter (Signed)
FML form dropped off for at front desk for completion.  Verified that patient section of form has been completed.  Last DOS/WCC with PCP was 02/03/20  Placed form in team folder to be completed by clinical staff.  Brooke Marks

## 2020-09-23 ENCOUNTER — Ambulatory Visit: Payer: Self-pay | Admitting: Psychology

## 2020-09-23 NOTE — Telephone Encounter (Signed)
Clinical info completed on FMLA form.  Place form in Dr. Hulen Luster box for completion.  Raynette Arras, CMA

## 2020-09-27 NOTE — Telephone Encounter (Signed)
Called patient regarding FMLA paperwork.  Discussed the questions answered of the paperwork to the best of my ability.  She is going to come pick it up from the front office on her child has an appointment tomorrow.  No further questions or concerns at this time.

## 2020-09-27 NOTE — Telephone Encounter (Signed)
Patient calls nurse line stating that she is returning missed phone call. Advised of upcoming appointment on 5/16. Patient wanted to check status of FMLA paperwork.   Forwarding to PCP  Talbot Grumbling, RN

## 2020-10-04 ENCOUNTER — Ambulatory Visit: Payer: Self-pay | Admitting: Family Medicine

## 2020-10-04 ENCOUNTER — Other Ambulatory Visit: Payer: Self-pay

## 2020-10-04 ENCOUNTER — Telehealth: Payer: Self-pay | Admitting: Family Medicine

## 2020-10-04 NOTE — Telephone Encounter (Signed)
Contacted patient to inquire about below and she said that there is no problem with the paperwork.  She wanted to know if there is anyway that she can do virtual visit at her next visit.  She said that even though she has FMLA that her job still counts anytime away against her, if her next visit can be change to virtual please let us know and we can change it and notify her of this. Jarreau Callanan Zimmerman Rumple, CMA

## 2020-10-04 NOTE — Telephone Encounter (Signed)
Contacted pt and informed her that Dr. Caron Presume said that her next visit could be virtual.  This has been changed to a MyChart video visit.Enmanuel Zufall Zimmerman Rumple, CMA

## 2020-10-04 NOTE — Telephone Encounter (Signed)
Patient is calling to reschedule her follow up appointment. She is scheduled for June but would like to know if Dr. Caron Presume will please call her as soon as possible to discuss issues with fmla paper work.   The best call back is [phone].

## 2020-10-08 ENCOUNTER — Other Ambulatory Visit: Payer: Self-pay | Admitting: Student in an Organized Health Care Education/Training Program

## 2020-11-01 ENCOUNTER — Telehealth (INDEPENDENT_AMBULATORY_CARE_PROVIDER_SITE_OTHER): Payer: Self-pay | Admitting: Family Medicine

## 2020-11-01 ENCOUNTER — Other Ambulatory Visit: Payer: Self-pay

## 2020-11-01 DIAGNOSIS — F4321 Adjustment disorder with depressed mood: Secondary | ICD-10-CM

## 2020-11-01 DIAGNOSIS — E669 Obesity, unspecified: Secondary | ICD-10-CM

## 2020-11-01 MED ORDER — POLYETHYLENE GLYCOL 3350 17 GM/SCOOP PO POWD: 17.0000 g | Freq: Every day | ORAL | 5 refills | Status: DC

## 2020-11-01 MED ORDER — HYDROXYZINE HCL 10 MG PO TABS: 10.0000 mg | ORAL_TABLET | Freq: Every day | ORAL | 0 refills | Status: DC

## 2020-11-01 NOTE — Assessment & Plan Note (Signed)
Patient currently on citalopram and reports that it is helping considerably with her depression.  She is concerned because she has gained 10 pounds in the last month.  We discussed healthy coping mechanisms for stress and depression.  Patient is interested in switching to a medication that may not cause weight gain.  We will work on healthy dietary changes and I placed a referral for healthy weight and wellness.  We discussed switching medications but given the citalopram is working so well with her depression I am hesitant to do so at this time.  We will reevaluate in 1 month and consider switching to a different medication at that time.

## 2020-11-01 NOTE — Assessment & Plan Note (Signed)
Patient's most recent weight in our clinic was 184 pounds.  She reports that she now weighs 194 pounds.  She reports that poor dietary habits related to stress.  Is asking for methods to help lose weight.  She reports that she walks a lot at work.  We discussed options and I have provided her with information to contact and schedule an appointment with the healthy weight and wellness clinic.  We will follow-up in 1 month for weight check and further discussion on the topic.

## 2020-11-01 NOTE — Progress Notes (Addendum)
[facility] Tele[facility]icine Visit  Patient consented to have virtual visit and was identified by name and date of birth. Method of visit: Video  Encounter participants: Patient: Brooke Marks - located at work Provider: Gifford Shave - located at [facility]  Chief Complaint: depression follow-up   HPI:  Patient presents for follow-up for grief.  She reports that her depression is doing well at this time but she is extremely stressed.  She does feel like she has not had time to grieve over the loss of her family member.  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  She reports that she stays up until 2-3 AM and goes to sleep and has to wake up at 630 to go to work.  She takes 10-15 mg of melatonin at night usually at 9:30 PM and will lay in bed until around midnight when she turns on the TV and watches TV until she falls asleep.  She is also concerned because she reports that she is eating her stress.  She reports that yesterday she ate 8 separate meals.  She will pack healthy options but then decide that she wants to eat the unhealthy options instead.  She reports 10 pounds of weight gain in the last month.  She is requesting medications for weight loss.  ROS: per HPI  Pertinent PMHx: Depression  Exam:  There were no vitals taken for this visit.  Respiratory: Speaking in clear sentences, no respiratory distress Psych: Anxious mood, intermittently sad  Assessment/Plan:  OBESITY, NOS Patient's most recent weight in our clinic was 184 pounds.  She reports that she now weighs 194 pounds.  She reports that poor dietary habits related to stress.  Is asking for methods to help lose weight.  She reports that she walks a lot at work.  We discussed options and I have provided her with information to contact and schedule an appointment with the healthy weight and wellness clinic.  We will follow-up in 1 month for  weight check and further discussion on the topic.  Grief reaction Patient currently on citalopram and reports that it is helping considerably with her depression.  She is concerned because she has gained 10 pounds in the last month.  We discussed healthy coping mechanisms for stress and depression.  Patient is interested in switching to a medication that may not cause weight gain.  We will work on healthy dietary changes and I placed a referral for healthy weight and wellness.  We discussed switching medications but given the citalopram is working so well with her depression I am hesitant to do so at this time.  We will reevaluate in 1 month and consider switching to a different medication at that time.    Time spent during visit with patient: 17 minutes

## 2020-11-01 NOTE — Patient Instructions (Addendum)
It was great talking to you today.  I am sorry you are having these issues with stress.

## 2020-12-01 ENCOUNTER — Ambulatory Visit: Payer: Self-pay | Admitting: Family Medicine

## 2021-01-04 ENCOUNTER — Ambulatory Visit (INDEPENDENT_AMBULATORY_CARE_PROVIDER_SITE_OTHER): Payer: Self-pay | Admitting: Family Medicine

## 2021-01-04 ENCOUNTER — Other Ambulatory Visit: Payer: Self-pay

## 2021-01-04 DIAGNOSIS — F4321 Adjustment disorder with depressed mood: Secondary | ICD-10-CM

## 2021-01-04 MED ORDER — SERTRALINE HCL 50 MG PO TABS: 50.0000 mg | ORAL_TABLET | Freq: Every day | ORAL | 0 refills | Status: DC

## 2021-01-04 NOTE — Patient Instructions (Signed)
It was great seeing you today.  I am sorry you are still having issues with the stress and anxiety.  I will extend your FMLA paperwork for 1 month but please bring me the paperwork.  Regarding your antidepressant medications I want to switch you to Zoloft 50 mg daily.  We can increase this dose after you have taken it for a few weeks if needed.  I would like to see you in 3-4 weeks for a follow-up and we can decide on titrating up the medication at that time.  If you have any issues or concerns please let us know.  I hope you have a wonderful afternoon!

## 2021-01-04 NOTE — Assessment & Plan Note (Signed)
Patient currently on citalopram.  Reports very little benefit from the citalopram and we discussed increasing the dose versus switching to another medication and patient would like to transition to another medication.  Transitioning to Zoloft 50 mg daily.  We can titrate this up as needed.  We will follow-up in 3-4 weeks to determine need for titration.  SI precautions given.  Regarding work patient is requesting a note saying that she can work from home.  I do not think that this is in her best interest at this time but do feel a continuation of her FMLA paperwork is necessary until we get the proper dose of her medication figured out.  She is going to provide the paperwork and I will extend it for 1 month.  Strict return precautions given and patient is agreeable to this.  No further questions or concerns at this time.

## 2021-01-04 NOTE — Progress Notes (Signed)
    SUBJECTIVE:   CHIEF COMPLAINT / HPI:   Depression follow-up Patient reports that her depression is doing terrible at this time.  She reports that she started the citalopram a few months ago and she is really not noticing any difference at this time.  She gets extremely anxious and gets a headache whenever she gets to work.  Reports that she was working from home but is now having to go into work every day and this is causing her immense anxiety and depression.  Reports good compliance with her citalopram.  Denies any SI or HI.  Would like a letter so that she can work from home  OBJECTIVE:   BP (!) 118/97   Pulse 88   Ht '5\' 2"'$  (1.575 m)   Wt 187 lb 6.4 oz (85 kg)   SpO2 99%   BMI 34.28 kg/m   General: Well-appearing 40 year old female, no acute distress Cardiac: Regular rate and rhythm, no murmurs appreciated Respiratory: Normal for breathing, speaking in full sentences Psych: Patient intermittently anxious.  Intermittently depressed.  Is not crying.  Denies SI or HI  PHQ9 SCORE ONLY 01/04/2021 09/15/2020 02/03/2020  PHQ-9 Total Score 24 23 0     ASSESSMENT/PLAN:   Grief reaction Patient currently on citalopram.  Reports very little benefit from the citalopram and we discussed increasing the dose versus switching to another medication and patient would like to transition to another medication.  Transitioning to Zoloft 50 mg daily.  We can titrate this up as needed.  We will follow-up in 3-4 weeks to determine need for titration.  SI precautions given.  Regarding work patient is requesting a note saying that she can work from home.  I do not think that this is in her best interest at this time but do feel a continuation of her FMLA paperwork is necessary until we get the proper dose of her medication figured out.  She is going to provide the paperwork and I will extend it for 1 month.  Strict return precautions given and patient is agreeable to this.  No further questions or concerns  at this time.     Gifford Shave, MD [place]

## 2021-02-17 ENCOUNTER — Other Ambulatory Visit: Payer: Self-pay | Admitting: Family Medicine

## 2021-02-17 DIAGNOSIS — I10 Essential (primary) hypertension: Secondary | ICD-10-CM

## 2021-03-06 NOTE — Progress Notes (Signed)
    SUBJECTIVE:   CHIEF COMPLAINT / HPI: f/u anxiety and depression   Patient presents for anxiety and depression. She reports she has been tolerating Zoloft 50mg . She reports that she has not been working from home recently due to work requirements to be in the office. She reports that she is experiencing panic attacks due to bullying and intimidation at work 3-4 times per week. She works in [place] and feels unable to report the negative experiences due to working in the dept that would normally deal with these types of issues.  Panic attacks include having palpitations, feeling SOB, HA, chest pain, anxiety and feeling "trapped".  Patient has been working with [facility] at work for counseling and saw a therapist here at [facility] in the past. She reports she has been dealing with anxiety and depression since her father passed in 2003. She reports having triggering events during work hours on  Mon-Fri and has been working at this job since 2014. Patient expresses concern that her job is trying to gaslight her so that she can be fired or feel forced to quit.   Patient requesting extended FMLA as she has probation period until 03/28/21 when she will be able to work from home. She would like accomodations for remote working but needs FMLA to leave when she has panic attacks soon. She also requests a refill for the Xanax as it helps when she is having panic attacks.  PERTINENT  PMH / PSH:  HTN   OBJECTIVE:   BP 110/70   Pulse (!) 108   Ht 5\' 2"  (1.575 m)   Wt 180 lb (81.6 kg)   LMP 02/19/2021 (Exact Date)   SpO2 98%   BMI 32.92 kg/m   General: female alert, making appropriate eye contact, well groomed, calm appearing Motor activity: no psychomotor agitation or slowing Speech: normal rate, volume and fluency  Mood: patient states mood is "ok"   Thought Content: coherent, no delusions, no A/V hallucincations, demonstrates normal insight & judgement Cognition: oriented to time, place and  person   ASSESSMENT/PLAN:   Anxiety state Patient reports experiencing 3-4 panic attacks per week while working. Anxiety and depressed symptoms occur primarily in relation to work and have been present since 2003. No SI/HI nor A/V hallucinations.  Patient was counseled on benefits of regular therapy, psychiatry referral submitted today. Patient also recommended to follow up with PCP in next 1-2 weeks to discuss formal paperwork for FMLA vs accommodations to work from home.  Refilled limited supply of Xanax to help with panic attacks.  Patient also given list of therapists in the event she would like to set up her on therapy sessions.    GAD 7 : Generalized Anxiety Score 03/07/2021 09/15/2020 10/13/2016 04/09/2015  Nervous, Anxious, on Edge 3 3 3 3   Control/stop worrying 3 3 3 3   Worry too much - different things 3 3 3 3   Trouble relaxing 3 3 3 3   Restless 0 3 0 3  Easily annoyed or irritable 3 3 3 3   Afraid - awful might happen 3 3 3 3   Total GAD 7 Score 18 21 18 21   Anxiety Difficulty Extremely difficult Extremely difficult Extremely difficult Extremely difficult      Eulis Foster, MD [place]

## 2021-03-07 ENCOUNTER — Encounter: Payer: Self-pay | Admitting: Family Medicine

## 2021-03-07 ENCOUNTER — Other Ambulatory Visit: Payer: Self-pay

## 2021-03-07 ENCOUNTER — Ambulatory Visit (INDEPENDENT_AMBULATORY_CARE_PROVIDER_SITE_OTHER): Payer: Self-pay | Admitting: Family Medicine

## 2021-03-07 VITALS — BP 110/70 | HR 108 | Ht 62.0 in | Wt 180.0 lb

## 2021-03-07 DIAGNOSIS — F411 Generalized anxiety disorder: Secondary | ICD-10-CM

## 2021-03-07 DIAGNOSIS — F339 Major depressive disorder, recurrent, unspecified: Secondary | ICD-10-CM

## 2021-03-07 MED ORDER — ALPRAZOLAM 0.25 MG PO TABS: 0.2500 mg | ORAL_TABLET | Freq: Every day | ORAL | 0 refills | Status: DC | PRN

## 2021-03-07 NOTE — Patient Instructions (Signed)
  In addition to the list below, I will send a referral to psychiatry as we discussed therapy would be hugely beneficial to help with coping skills as you deal with your daily stressors.   I will also provide a refill of your Xanax.   I will also recommend follow up with Dr. Caron Presume in the next few weeks to discuss extending your formal paperwork to discuss further leave. The psychiatry referral will help [place] with providing evidence to support accommodations at work.     Psychiatry Resource List (Adults and Children) Most of these providers will take Medicaid. please consult your insurance for a complete and updated list of available providers. When calling to make an appointment have your insurance information available to confirm you are covered.   [facility]:Psychiatry and Counseling [address] [place], [place] [address] [phone]  [facility]  [place], [place]:   [facility]: [address]     [phone]   Linna Hoff: [place]. [place],        [phone] [place]: [place],    [place]: [phone] [address],                   [phone] Children: [place] [place] [address]         [phone]  [place] (virtual only) [phone]    [facility]  (Psychiatry only; Adults /children 12 and over, will take Medicaid)  [place], [place], [place] [address]       [phone]   [facility] (Psychiatry & counseling ; adults & children ; will take Medicaid [address]  [place] [address]  Go on-line to complete referral ( [email or web address] [phone]    (Spanish speaking therapists)  [facility]  Psychiatry & counseling; Adults and  children;  Call Registration prior to scheduling an appointment [phone] [address]    [place], [place] [address]    [phone]  [facility] (Psychiatry & counseling; adults & children; Medicare no Medicaid)  [place], [place]  [address]      [phone]    [facility] (up to age 72)  Psychiatry & counseling ,will take Medicaid, must do counseling to receive psychiatry services  [address] [place] [address]        ([facility] (Psychiatry & counseling; adults & children; will take Medicaid) Will need a referral from provider [address],  [place], [place]  [phone]   [facility] --- Walk-In Mon-Friday 8am-3pm ( will take Medicaid, Psychiatry, Adults & children,  [address] [place], [place]   [phone]   [facility]--, Walk-in M-F 8am-12pm and 1pm -3pm   (Counseling, Psychiatry, will take Medicaid, adults & children)  [address], [place], [place]  [phone]

## 2021-03-07 NOTE — Assessment & Plan Note (Signed)
Patient reports experiencing 3-4 panic attacks per week while working. Anxiety and depressed symptoms occur primarily in relation to work and have been present since 2003. No SI/HI nor A/V hallucinations.  Patient was counseled on benefits of regular therapy, psychiatry referral submitted today. Patient also recommended to follow up with PCP in next 1-2 weeks to discuss formal paperwork for FMLA vs accommodations to work from home.  Refilled limited supply of Xanax to help with panic attacks.  Patient also given list of therapists in the event she would like to set up her on therapy sessions.

## 2021-03-11 ENCOUNTER — Telehealth: Payer: Self-pay | Admitting: Family Medicine

## 2021-03-11 NOTE — Telephone Encounter (Signed)
Patient dropped off forms at front desk for completion.  Verified that patient section of form has been completed.  Last DOS with PCP was 03/07/2021.  Placed form in white team folder to be completed by clinical staff.  [place] Silos

## 2021-03-14 NOTE — Telephone Encounter (Signed)
Clinical info completed on FMLA form.  Place form in Dr. Hulen Luster box for completion.  Ottis Stain, CMA

## 2021-03-22 ENCOUNTER — Encounter: Payer: Self-pay | Admitting: Family Medicine

## 2021-03-25 ENCOUNTER — Ambulatory Visit: Payer: Federal, State, Local not specified - PPO | Admitting: Family Medicine

## 2021-03-30 DIAGNOSIS — N390 Urinary tract infection, site not specified: Secondary | ICD-10-CM | POA: Diagnosis not present

## 2021-10-25 ENCOUNTER — Encounter: Payer: Self-pay | Admitting: *Deleted

## 2021-11-14 ENCOUNTER — Ambulatory Visit (INDEPENDENT_AMBULATORY_CARE_PROVIDER_SITE_OTHER): Payer: Federal, State, Local not specified - PPO | Admitting: Family Medicine

## 2021-11-14 VITALS — BP 124/90 | HR 111 | Temp 99.0°F | Ht 62.0 in | Wt 183.4 lb

## 2021-11-14 DIAGNOSIS — A09 Infectious gastroenteritis and colitis, unspecified: Secondary | ICD-10-CM | POA: Diagnosis not present

## 2021-11-14 MED ORDER — LEVOFLOXACIN 500 MG PO TABS: 500.0000 mg | ORAL_TABLET | Freq: Every day | ORAL | 0 refills | Status: DC

## 2021-11-14 NOTE — Progress Notes (Signed)
   SUBJECTIVE:   CHIEF COMPLAINT / HPI:   Brooke Marks is a 41 y.o. female here for persistent diarrhea.  Patient went to [place] in the early part of this month.  When she returned she started having loose stools.  Endorses chills and an elevated temperature around 100 F.  Notes that she ate the food and drink the water during her vacation in [place].  She endorses epigastric abdominal pain.  Denies blood in stool, melena, vomiting, floating stools.  She does note malodorous stools.     PERTINENT  PMH / PSH: reviewed and updated as appropriate   OBJECTIVE:   BP 124/90   Pulse (!) 111   Temp 99 F (37.2 C) (Oral)   Ht '5\' 2"'$  (1.575 m)   Wt 183 lb 6 oz (83.2 kg)   LMP 10/30/2021   SpO2 100%   BMI 33.54 kg/m    GEN: well appearing female in no acute distress  CVS: well perfused, RRR RESP: speaking in full sentences without pause, CTAB  ABD: soft, non-tender, non-distended, no palpable masses  SKIN: warm, dry    ASSESSMENT/PLAN:    Travelers diarrhea Patient returning from recent international trip after consuming the water and food there.  Suspect traveler's diarrhea.  Treat with Levaquin 500 mg daily for 7 days. ED/return precautions provided.    Lyndee Hensen, DO PGY-3, [facility] 11/15/2021

## 2021-11-15 ENCOUNTER — Encounter: Payer: Self-pay | Admitting: Family Medicine

## 2022-02-13 NOTE — Progress Notes (Unsigned)
    SUBJECTIVE:   Chief compliant/HPI: annual examination  Brooke Marks is a 41 y.o. who presents today for an annual exam.   Updated history tabs and problem list.   Right breast concern: Concerned of lump in right breast, she noticed it a couple months ago and then a couple weeks ago she felt it more prominently. Feels as if it is a bump underneath the surface. Denies drainage from the area, nipple discharge, fever. Unsure if it changes during menstrual cycle. Denies family history of breast cancer or genetic conditions.  OBJECTIVE:   BP (!) 135/94   Pulse (!) 105   Ht _0  (1.575 m)   Wt 188 lb 3.2 oz (85.4 kg)   LMP  (Within Days)   BMI 34.42 kg/m   General: Well-developed well-nourished, no acute distress, tearful and anxious about discussion CV: RRR, no murmurs auscultated Pulm: CTAB, normal WOB Breasts: left breast normal without mass, skin or nipple changes or axillary nodes; right breast abnormal mass palpable: 6 mm firm nodule at 10 o'clock position of right breast, no axillary lymph nodes appreciated Abdomen: Soft, nontender, normoactive bowel sounds  ASSESSMENT/PLAN:  Annual physical exam Assessment & Plan: Advanced directive booklet given.  Counseled on multivitamin with folate given sexually active and no methods of birth control.  Up-to-date for Pap, due next in 2026.  Declined flu vaccination today.    Breast lump fixed to skin Assessment & Plan: New right breast lump, no family history of breast cancer or genetic conditions which involved breast cancer.  Nonmobile mass potentially fibroadenoma, cystic structure, cancerous changes.  Discussed with patient who has been very anxious regarding this diagnosis but advised that she did the best thing by coming in today and we will explore with imaging.  Orders: -     MM DIAG BREAST TOMO BILATERAL; Future  Screening cholesterol level Assessment & Plan: Screening per general recommendations during annual  physical every 4 to 6 years.  Orders: -     Lipid panel  Primary hypertension Assessment & Plan: Currently on amlodipine 10 mg daily.  Endorses compliance.  Elevated blood pressure today although during anxious and stressful conversation regarding new breast lump.  Follow-up in 1 month.   Annual Examination  See AVS for age appropriate recommendations.   PHQ score 16, reviewed and discussed. Blood pressure reviewed and at not at goal, likely related to stress of breast finding.  Asked about intimate partner violence and patient reports none.  The patient currently uses nothing for contraception. Folate recommended as appropriate, minimum of 400 mcg per day.  Advanced directives, booklet given today.  Considered the following items based upon USPSTF recommendations: HIV testing: discussed Hepatitis C: discussed Hepatitis B: discussed Syphilis if at high risk: discussed GC/CT not at high risk and not ordered. Lipid panel (nonfasting or fasting) discussed based upon AHA recommendations and ordered.  Consider repeat every 4-6 years.  Reviewed risk factors for latent tuberculosis and not indicated  Discussed family history, BRCA testing not indicated. Cervical cancer screening: prior Pap reviewed, repeat due in 2026 Immunizations, declined flu vaccine   Return in about 1 month (around 03/16/2022) for Follow-up HTN, anxiety.   [name], [place]

## 2022-02-14 ENCOUNTER — Ambulatory Visit (INDEPENDENT_AMBULATORY_CARE_PROVIDER_SITE_OTHER): Payer: Federal, State, Local not specified - PPO | Admitting: Student

## 2022-02-14 ENCOUNTER — Other Ambulatory Visit: Payer: Self-pay | Admitting: Family Medicine

## 2022-02-14 ENCOUNTER — Encounter: Payer: Self-pay | Admitting: Student

## 2022-02-14 ENCOUNTER — Other Ambulatory Visit: Payer: Self-pay | Admitting: Student

## 2022-02-14 VITALS — BP 135/94 | HR 105 | Ht 62.0 in | Wt 188.2 lb

## 2022-02-14 DIAGNOSIS — N63 Unspecified lump in unspecified breast: Secondary | ICD-10-CM

## 2022-02-14 DIAGNOSIS — Z0000 Encounter for general adult medical examination without abnormal findings: Secondary | ICD-10-CM

## 2022-02-14 DIAGNOSIS — C50911 Malignant neoplasm of unspecified site of right female breast: Secondary | ICD-10-CM | POA: Insufficient documentation

## 2022-02-14 DIAGNOSIS — I10 Essential (primary) hypertension: Secondary | ICD-10-CM | POA: Diagnosis not present

## 2022-02-14 DIAGNOSIS — Z13220 Encounter for screening for lipoid disorders: Secondary | ICD-10-CM

## 2022-02-14 NOTE — Assessment & Plan Note (Signed)
Currently on amlodipine 10 mg daily.  Endorses compliance.  Elevated blood pressure today although during anxious and stressful conversation regarding new breast lump.  Follow-up in 1 month.

## 2022-02-14 NOTE — Patient Instructions (Addendum)
It was great to see you today! Thank you for choosing [facility] for your primary care. Brooke Marks was seen for annual physical and breast concern.  Today we addressed: Breast lump: We are scheduling you for a breast mammogram. Things to do to Keep yourself Healthy - Exercise at least 30-45 minutes a day, 3-4 days a week. >150 min of moderate intensity per week is advised. - Eat a low-fat diet with lots of fruits and vegetables, up to 7-9 servings per day. - Seatbelts can save your life. Wear them always. - Smoke detectors on every level of your home, check batteries every year. - Eye Doctor - have an eye exam every 1-2 years - Safe sex - if you may be exposed to STDs, use a condom. - Alcohol If you drink, do it moderately, less than 1 drink per day. - [place].  Choose someone to speak for you if you are not able. - Depression is common in our stressful world.If you're feeling down or losing interest in things you normally enjoy, please come in for a visit. - Violence - If anyone is threatening or hurting you, please call immediately.   If you haven't already, sign up for My Chart to have easy access to your labs results, and communication with your primary care physician.  We are checking some labs today. If they are abnormal, I will call you. If they are normal, I will send you a MyChart message (if it is active) or a letter in the mail. If you do not hear about your labs in the next 2 weeks, please call the office. Call the clinic at [phone] if your symptoms worsen or you have any concerns.  You should return to our clinic Return in about 1 month (around 03/16/2022) for Follow-up HTN, anxiety. Please arrive 15 minutes before your appointment to ensure smooth check in process.  We appreciate your efforts in making this happen.  Thank you for allowing me to participate in your care, [name], DO 02/14/2022, 9:07 AM PGY-2, [place]

## 2022-02-14 NOTE — Assessment & Plan Note (Addendum)
Advanced directive booklet given.  Counseled on multivitamin with folate given sexually active and no methods of birth control.  Up-to-date for Pap, due next in 2026.  Declined flu vaccination today.

## 2022-02-14 NOTE — Assessment & Plan Note (Signed)
New right breast lump, no family history of breast cancer or genetic conditions which involved breast cancer.  Nonmobile mass potentially fibroadenoma, cystic structure, cancerous changes.  Discussed with patient who has been very anxious regarding this diagnosis but advised that she did the best thing by coming in today and we will explore with imaging.

## 2022-02-14 NOTE — Assessment & Plan Note (Signed)
Screening per general recommendations during annual physical every 4 to 6 years.

## 2022-02-15 LAB — LIPID PANEL
Chol/HDL Ratio: 3.7 ratio (ref 0.0–4.4)
Cholesterol, Total: 173 mg/dL (ref 100–199)
HDL: 47 mg/dL (ref >39)
LDL Chol Calc (NIH): 111 mg/dL — ABNORMAL HIGH (ref 0–99)
Triglycerides: 80 mg/dL (ref 0–149)
VLDL Cholesterol Cal: 15 mg/dL (ref 5–40)

## 2022-02-16 ENCOUNTER — Encounter: Payer: Self-pay | Admitting: Student

## 2022-02-16 ENCOUNTER — Telehealth: Payer: Self-pay | Admitting: Student

## 2022-02-16 NOTE — Telephone Encounter (Signed)
Called to discuss cholesterol results.  Left voice message stating we would need to have risks versus benefits conversation which is more than appropriate to have on her next visit in October.

## 2022-02-22 ENCOUNTER — Ambulatory Visit
Admission: RE | Admit: 2022-02-22 | Discharge: 2022-02-22 | Disposition: A | Discharge status: Home / Self Care | Payer: Federal, State, Local not specified - PPO | Source: Ambulatory Visit | Attending: Family Medicine | Admitting: Family Medicine

## 2022-02-22 ENCOUNTER — Other Ambulatory Visit: Payer: Self-pay | Admitting: Student

## 2022-02-22 DIAGNOSIS — N63 Unspecified lump in unspecified breast: Secondary | ICD-10-CM

## 2022-02-22 DIAGNOSIS — R928 Other abnormal and inconclusive findings on diagnostic imaging of breast: Secondary | ICD-10-CM | POA: Diagnosis not present

## 2022-02-22 DIAGNOSIS — R921 Mammographic calcification found on diagnostic imaging of breast: Secondary | ICD-10-CM

## 2022-02-22 DIAGNOSIS — R599 Enlarged lymph nodes, unspecified: Secondary | ICD-10-CM

## 2022-02-22 DIAGNOSIS — N6310 Unspecified lump in the right breast, unspecified quadrant: Secondary | ICD-10-CM

## 2022-02-22 DIAGNOSIS — N6311 Unspecified lump in the right breast, upper outer quadrant: Secondary | ICD-10-CM | POA: Diagnosis not present

## 2022-02-27 ENCOUNTER — Other Ambulatory Visit: Payer: Self-pay | Admitting: Student

## 2022-02-27 DIAGNOSIS — F411 Generalized anxiety disorder: Secondary | ICD-10-CM

## 2022-02-27 MED ORDER — HYDROXYZINE PAMOATE 25 MG PO CAPS: 25.0000 mg | ORAL_CAPSULE | Freq: Three times a day (TID) | ORAL | 0 refills | Status: DC | PRN

## 2022-03-08 ENCOUNTER — Ambulatory Visit
Admission: RE | Admit: 2022-03-08 | Discharge: 2022-03-08 | Disposition: A | Discharge status: Home / Self Care | Payer: Federal, State, Local not specified - PPO | Source: Ambulatory Visit | Attending: Family Medicine | Admitting: Family Medicine

## 2022-03-08 DIAGNOSIS — N6310 Unspecified lump in the right breast, unspecified quadrant: Secondary | ICD-10-CM

## 2022-03-08 DIAGNOSIS — R590 Localized enlarged lymph nodes: Secondary | ICD-10-CM | POA: Diagnosis not present

## 2022-03-08 DIAGNOSIS — R599 Enlarged lymph nodes, unspecified: Secondary | ICD-10-CM

## 2022-03-08 DIAGNOSIS — R921 Mammographic calcification found on diagnostic imaging of breast: Secondary | ICD-10-CM | POA: Diagnosis not present

## 2022-03-08 DIAGNOSIS — C50411 Malignant neoplasm of upper-outer quadrant of right female breast: Secondary | ICD-10-CM | POA: Diagnosis not present

## 2022-03-08 DIAGNOSIS — C773 Secondary and unspecified malignant neoplasm of axilla and upper limb lymph nodes: Secondary | ICD-10-CM | POA: Diagnosis not present

## 2022-03-08 DIAGNOSIS — C50211 Malignant neoplasm of upper-inner quadrant of right female breast: Secondary | ICD-10-CM | POA: Diagnosis not present

## 2022-03-08 DIAGNOSIS — N6311 Unspecified lump in the right breast, upper outer quadrant: Secondary | ICD-10-CM | POA: Diagnosis not present

## 2022-03-10 ENCOUNTER — Telehealth: Payer: Self-pay | Admitting: Hematology and Oncology

## 2022-03-10 NOTE — Telephone Encounter (Signed)
Called pt to sch appt per 10/19 staff msg. No answer. Left msg for pt to call back to sch appt.  

## 2022-03-13 ENCOUNTER — Telehealth: Payer: Self-pay | Admitting: Hematology and Oncology

## 2022-03-13 ENCOUNTER — Encounter: Payer: Self-pay | Admitting: Student

## 2022-03-13 NOTE — Progress Notes (Signed)
  SUBJECTIVE:   CHIEF COMPLAINT / HPI:   Hypertension: BP: (!) 128/100 today. Home medications include: Amlodipine 10 mg daily. She does not endorse taking these medications as prescribed for at least the last 5 days. Patient has not had a BMP in the past 1 year.  Breast cancer: will be undergoing genetic testing and meeting with surgeon on Friday.   Anxiety/depression: She has lost 15 pounds since being seen 1 month ago because she has such a drastic loss of appetite.  She does not have trouble sleeping but lays in bed all day because she does not have energy given the recent diagnosis of breast cancer.  Prediabetes: She brought up that she had an A1c in prediabetic range in 2020.  This is true.  PERTINENT  PMH / PSH: HTN, invasive ductal carcinoma of right breast  OBJECTIVE:  BP (!) 128/100   Pulse 98   Wt 173 lb (78.5 kg)   LMP 02/19/2022 (Within Days)   SpO2 99%   BMI 31.64 kg/m  Physical Exam Psychiatric:        Attention and Perception: Attention normal.        Mood and Affect: Mood is anxious and depressed. Affect is tearful.        Speech: Speech normal.        Behavior: Behavior normal.        Thought Content: Thought content normal.        Judgment: Judgment normal.    ASSESSMENT/PLAN:  Anxiety state Assessment & Plan: Significant, poorly controlled, does have good support system at home in the form of her husband and has a counselor that she speaks with regularly.  Hydroxyzine was not beneficial.  Initiate SSRI today, increase dosage monthly based on response.  Orders: -     FLUoxetine HCl; Take 1 capsule (20 mg total) by mouth daily.  Dispense: 30 capsule; Refill: 1  Primary hypertension Assessment & Plan: BP: (!) 128/100 today. Poorly controlled, due to medication nonadherence. Goal of <130/80. Follow up in 1 month. Medication regimen: Continue amlodipine 10 mg daily    Prediabetes Assessment & Plan: A1c 5.9 in 2020.  Recheck 6.3, still prediabetic  range.  Orders: -     POCT glycosylated hemoglobin (Hb A1C)  Return in about 1 month (around 04/14/2022) for Anxiety and HTN follow-up. [name], DO 03/14/2022, 2:18 PM PGY-2, [place]

## 2022-03-13 NOTE — Telephone Encounter (Signed)
Scheduled appt per 10/19 staff msg from navigator. Pt is aware of appt date and time. Pt is aware to arrive 15 mins prior to appt time and to bring and updated insurance card. Pt is aware of appt location.

## 2022-03-13 NOTE — Telephone Encounter (Signed)
Called pt to sch appt per 10/19 staff msg. No answer. Left msg for pt to call back to sch appt.  

## 2022-03-14 ENCOUNTER — Other Ambulatory Visit: Payer: Self-pay | Admitting: General Surgery

## 2022-03-14 ENCOUNTER — Encounter: Payer: Self-pay | Admitting: Student

## 2022-03-14 ENCOUNTER — Other Ambulatory Visit: Payer: Self-pay | Admitting: *Deleted

## 2022-03-14 ENCOUNTER — Other Ambulatory Visit: Payer: Self-pay

## 2022-03-14 ENCOUNTER — Ambulatory Visit (INDEPENDENT_AMBULATORY_CARE_PROVIDER_SITE_OTHER): Payer: Federal, State, Local not specified - PPO | Admitting: Student

## 2022-03-14 VITALS — BP 128/100 | HR 98 | Wt 173.0 lb

## 2022-03-14 DIAGNOSIS — R7303 Prediabetes: Secondary | ICD-10-CM | POA: Diagnosis not present

## 2022-03-14 DIAGNOSIS — C50911 Malignant neoplasm of unspecified site of right female breast: Secondary | ICD-10-CM

## 2022-03-14 DIAGNOSIS — C773 Secondary and unspecified malignant neoplasm of axilla and upper limb lymph nodes: Secondary | ICD-10-CM | POA: Diagnosis not present

## 2022-03-14 DIAGNOSIS — I10 Essential (primary) hypertension: Secondary | ICD-10-CM | POA: Diagnosis not present

## 2022-03-14 DIAGNOSIS — Z170 Estrogen receptor positive status [ER+]: Secondary | ICD-10-CM

## 2022-03-14 DIAGNOSIS — F411 Generalized anxiety disorder: Secondary | ICD-10-CM

## 2022-03-14 LAB — POCT GLYCOSYLATED HEMOGLOBIN (HGB A1C): HbA1c, POC (prediabetic range): 6.3 % (ref 5.7–6.4)

## 2022-03-14 MED ORDER — FLUOXETINE HCL 20 MG PO CAPS: 20.0000 mg | ORAL_CAPSULE | Freq: Every day | ORAL | 1 refills | Status: DC

## 2022-03-14 NOTE — Patient Instructions (Addendum)
It was great to see you today! Thank you for choosing [facility] for your primary care. Brooke Marks was seen for follow-up for hypertension and breast cancer.  Today we addressed: Hypertension: Please take your amlodipine 10 mg daily. Breast cancer/anxiety: We have started you on Prozac 20 mg daily.  This should improve both your appetite and energy level but please take it every day as it does need a little time to work.   If you haven't already, sign up for My Chart to have easy access to your labs results, and communication with your primary care physician.  I recommend that you always bring your medications to each appointment as this makes it easy to ensure you are on the correct medications and helps [place] not miss refills when you need them. Call the clinic at [phone] if your symptoms worsen or you have any concerns.  You should return to our clinic Return in about 1 month (around 04/14/2022) for Anxiety and HTN follow-up. Please arrive 15 minutes before your appointment to ensure smooth check in process.  We appreciate your efforts in making this happen.  Thank you for allowing me to participate in your care, [name], DO 03/14/2022, 1:51 PM PGY-2, [place]

## 2022-03-14 NOTE — Assessment & Plan Note (Addendum)
Significant, poorly controlled, does have good support system at home in the form of her husband and has a counselor that she speaks with regularly.  Hydroxyzine was not beneficial.  Initiate SSRI today, increase dosage monthly based on response.

## 2022-03-14 NOTE — Assessment & Plan Note (Addendum)
A1c 5.9 in 2020.  Recheck 6.3, still prediabetic range.

## 2022-03-14 NOTE — Assessment & Plan Note (Addendum)
BP: (!) 128/100 today. Poorly controlled, due to medication nonadherence. Goal of <130/80. Follow up in 1 month. Medication regimen: Continue amlodipine 10 mg daily

## 2022-03-15 ENCOUNTER — Encounter: Payer: Self-pay | Admitting: Physical Therapy

## 2022-03-15 ENCOUNTER — Ambulatory Visit: Payer: Federal, State, Local not specified - PPO | Attending: General Surgery | Admitting: Physical Therapy

## 2022-03-15 ENCOUNTER — Other Ambulatory Visit: Payer: Self-pay

## 2022-03-15 DIAGNOSIS — R293 Abnormal posture: Secondary | ICD-10-CM | POA: Diagnosis not present

## 2022-03-15 DIAGNOSIS — C50911 Malignant neoplasm of unspecified site of right female breast: Secondary | ICD-10-CM | POA: Diagnosis not present

## 2022-03-15 DIAGNOSIS — C773 Secondary and unspecified malignant neoplasm of axilla and upper limb lymph nodes: Secondary | ICD-10-CM | POA: Insufficient documentation

## 2022-03-15 NOTE — Therapy (Signed)
OUTPATIENT PHYSICAL THERAPY BREAST CANCER BASELINE EVALUATION   Patient Name: Brooke Marks MRN: [id number] DOB:July 27, 1980, 41 y.o., female Today's Date: 03/15/2022   PT End of Session - 03/15/22 0943     Visit Number 1    Number of Visits 2    Date for PT Re-Evaluation 05/10/22    PT Start Time 0858    PT Stop Time 0941    PT Time Calculation (min) 43 min    Activity Tolerance Patient tolerated treatment well    Behavior During Therapy [facility] for tasks assessed/performed             Past Medical History:  Diagnosis Date   Anxiety    Chlamydia    Depression    Diabetes mellitus without complication ([place])    Gestational   Gestational diabetes mellitus 12/17/2018   Hypertension    Panic attack with specific phobia 04/11/2010   Panic attacks specifically with telephone ring. Rarely uses her cell phone for calls. Usually communicates via text. Affecting her job. Works with [employer]. 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 Concerned that her employer is planning to return her previous position. Denies agoraphobia.      PIH (pregnancy induced hypertension) 01/27/2016   S/P cesarean section 01/27/2016   Past Surgical History:  Procedure Laterality Date   CESAREAN SECTION     CESAREAN SECTION N/A 01/27/2016   Procedure: CESAREAN SECTION;  Surgeon: Janyth Contes, MD;  Location: [place];  Service: Obstetrics;  Laterality: N/A;  MD requests RNFA   COLON RESECTION N/A 03/30/2019   Procedure: LAPAROSCOPIC RIGHT COLECTOMY WITH RIGHT SALPINGO OOPHORECTOMY TAP BLOCK;  Surgeon: Michael Boston, MD;  [facility];  Service: General;  Laterality: N/A;   LAPAROSCOPIC LYSIS OF ADHESIONS N/A 03/30/2019   Procedure: LAPAROSCOPIC LYSIS OF ADHESIONS;  Surgeon: Michael Boston, MD;  [facility];  Service: General;  Laterality: N/A;   WISDOM TOOTH EXTRACTION     Patient Active Problem List   Diagnosis Date Noted   Malignant neoplasm of overlapping sites  of right breast in female, estrogen receptor positive ([place]) 03/14/2022   Prediabetes 03/14/2022   Invasive ductal carcinoma of breast, stage 2, right ([place]) 02/14/2022   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/30/2019 03/30/2019   Hypertension 01/27/2016   S/P cesarean section 01/27/2016   Anxiety state 04/13/2015   CARPAL TUNNEL SYNDROME, LEFT 04/11/2010   CHEST PAIN, ATYPICAL 04/11/2010   HERPES SIMPLEX INFECTION, RECURRENT 02/26/2009   OBESITY, NOS 07/19/2006    PCP: [name], DO  REFERRING PROVIDER: Rolm Bookbinder, MD   REFERRING DIAG: [phone] (ICD-10-CM) - Malignant neoplasm of unspecified site of right female breast C77.3 (ICD-10-CM) - Secondary and unspecified malignant neoplasm of axilla and upper limb lymph nodes   THERAPY DIAG:  Abnormal posture  Malignant neoplasm of right female breast, unspecified estrogen receptor status, unspecified site of breast ([place])  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 It is ER/PR +, HER2-. Ki67 20%. It is located in different locations in the R breast.   PATIENT GOALS:   reduce lymphedema risk and learn post op HEP.   PAIN:  Are you having pain? No  PRECAUTIONS: Active CA None  HAND DOMINANCE: left  WEIGHT BEARING RESTRICTIONS: No  FALLS:  Has patient fallen in last 6 months? 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[employer], computer work, pt works from home  LEISURE: barely, walks weekly  PRIOR LEVEL OF FUNCTION: Independent   OBJECTIVE:  COGNITION: Overall cognitive status: Within functional limits for tasks assessed    POSTURE:  Forward head and rounded shoulders posture  UPPER EXTREMITY AROM/PROM:  A/PROM RIGHT   eval   Shoulder extension 85  Shoulder flexion 169  Shoulder abduction 167  Shoulder internal rotation 43  Shoulder external rotation 80    (Blank rows = not tested)  A/PROM LEFT   eval  Shoulder extension 85  Shoulder flexion 160  Shoulder abduction 172  Shoulder internal rotation 51  Shoulder external rotation 93    (Blank rows = not tested)   CERVICAL AROM: All within normal limits:    Percent limited  Flexion WFL  Extension WFL  Right lateral flexion WFL  Left lateral flexion WFL  Right rotation WFL  Left rotation WFL    UPPER EXTREMITY STRENGTH: 5/5  LYMPHEDEMA ASSESSMENTS:   LANDMARK RIGHT   eval  10 cm proximal to olecranon process 33.5  Olecranon process 27.5  10 cm proximal to ulnar styloid process 23.9  Just proximal to ulnar styloid process 16.5  Across hand at thumb web space 19.5  At base of 2nd digit 6.2  (Blank rows = not tested)  LANDMARK LEFT   eval  10 cm proximal to olecranon process 33  Olecranon process 27  10 cm proximal to ulnar styloid process 23  Just proximal to ulnar styloid process 16.3  Across hand at thumb web space 19.9  At base of 2nd digit 6.2  (Blank rows = not tested)   L-DEX LYMPHEDEMA SCREENING:  The patient was assessed using the L-Dex machine today to produce a lymphedema index baseline score. The patient will be reassessed on a regular basis (typically every 3 months) to obtain new L-Dex scores. If the score is > 6.5 points away from his/her baseline score indicating onset of subclinical lymphedema, it will be recommended  to wear a compression garment for 4 weeks, 12 hours per day and then be reassessed. If the score continues to be > 6.5 points from baseline at reassessment, we will initiate lymphedema treatment. Assessing in this manner has a 95% rate of preventing clinically significant lymphedema.   L-DEX FLOWSHEETS - 03/15/22 0900       L-DEX LYMPHEDEMA SCREENING   Measurement Type Unilateral    L-DEX MEASUREMENT EXTREMITY Upper Extremity    POSITION  Standing    DOMINANT SIDE Left    At Risk Side Right    BASELINE SCORE (UNILATERAL) 9.9              QUICK DASH SURVEY:  Katina Dung - 03/15/22 0001  Open a tight or new jar No difficulty    Do heavy household chores (wash walls, wash floors) No difficulty    Carry a shopping bag or briefcase No difficulty    Wash your back No difficulty    Use a knife to cut food No difficulty    Recreational activities in which you take some force or impact through your arm, shoulder, or hand (golf, hammering, tennis) No difficulty    During the past week, to what extent has your arm, shoulder or hand problem interfered with your normal social activities with family, friends, neighbors, or groups? Not at all    During the past week, to what extent has your arm, shoulder or hand problem limited your work or other regular daily activities Not at all    Arm, shoulder, or hand pain. Moderate    Tingling (pins and needles) in your arm, shoulder, or hand Mild    Difficulty Sleeping Mild difficulty    DASH Score 9.09 %              PATIENT EDUCATION:  Education details: Lymphedema risk reduction and post op shoulder/posture HEP Person educated: Patient Education method: Explanation, Demonstration, Handout Education comprehension: Patient verbalized understanding and returned demonstration  HOME EXERCISE PROGRAM: Patient was instructed today in a home exercise program today for post op shoulder range of motion. These included active assist shoulder  flexion in sitting, scapular retraction, wall walking with shoulder abduction, and hands behind head external rotation.  She was encouraged to do these twice a day, holding 3 seconds and repeating 5 times when permitted by her physician.   ASSESSMENT:  CLINICAL IMPRESSION: Pt reports to PT with recently diagnosed R breast cancer with mets to her axillary nodes. She is not sure what her surgical plan is because she is awaiting to have an MRI. She will require radiation either way but she is not sure if she will need chemo. At baseline her shoulder and cervical ROM are [facility]. She will benefit from a post op PT reassessment to determine needs and from L-Dex screens every 3 months for 2 years to detect subclinical lymphedema.  Pt will benefit from skilled therapeutic intervention to improve on the following deficits: Decreased knowledge of precautions, impaired UE functional use, pain, decreased ROM, postural dysfunction.   PT treatment/interventions: ADL/self-care home management, pt/family education, therapeutic exercise  REHAB POTENTIAL: Good  CLINICAL DECISION MAKING: Stable/uncomplicated  EVALUATION COMPLEXITY: Low   GOALS: Goals reviewed with patient? YES  LONG TERM GOALS: (STG=LTG)    Name Target Date Goal status  1 Pt will be able to verbalize understanding of pertinent lymphedema risk reduction practices relevant to her dx specifically related to skin care.  Baseline:  No knowledge 03/15/2022 Achieved at eval  2 Pt will be able to return demo and/or verbalize understanding of the post op HEP related to regaining shoulder ROM. Baseline:  No knowledge 03/15/2022 Achieved at eval  3 Pt will be able to verbalize understanding of the importance of attending the post op After Breast CA Class for further lymphedema risk reduction education and therapeutic exercise.  Baseline:  No knowledge 03/15/2022 Achieved at eval  4 Pt will demo she has regained full shoulder ROM and function post  operatively compared to baselines.  Baseline: See objective measurements taken today. 05/10/22 NEW     PLAN:  PT FREQUENCY/DURATION: EVAL and 1 follow up appointment.   PLAN FOR NEXT SESSION: will reassess 3-4 weeks post op to determine needs.  Patient will follow up at outpatient cancer rehab 3-4 weeks following surgery.  If the patient requires physical therapy at that time, a specific plan will be dictated and sent to the referring physician for approval. The patient was educated today on appropriate basic range of motion exercises to begin post operatively and the importance of attending the After Breast Cancer class following surgery.  Patient was educated today on lymphedema risk reduction practices as it pertains to recommendations that will benefit the patient immediately following surgery.  She verbalized good understanding.    Physical Therapy Information for After Breast Cancer Surgery/Treatment:  Lymphedema is a swelling condition that you may be at risk for in your arm if you have lymph nodes removed from the armpit area.  After a sentinel node biopsy, the risk is approximately 5-9% and is higher after an axillary node dissection.  There is treatment available for this condition and it is not life-threatening.  Contact your physician or physical therapist with concerns. You may begin the 4 shoulder/posture exercises (see additional sheet) when permitted by your physician (typically a week after surgery).  If you have drains, you may need to wait until those are removed before beginning range of motion exercises.  A general recommendation is to not lift your arms above shoulder height until drains are removed.  These exercises should be done to your tolerance and gently.  This is not a "no pain/no gain" type of recovery so listen to your body and stretch into the range of motion that you can tolerate, stopping if you have pain.  If you are having immediate reconstruction, ask your  plastic surgeon about doing exercises as he or she may want you to wait. We encourage you to attend the free one time ABC (After Breast Cancer) class offered by [facility].  You will learn information related to lymphedema risk, prevention and treatment and additional exercises to regain mobility following surgery.  You can call [phone] for more information.  This is offered the 1st and 3rd Monday of each month.  You only attend the class one time. While undergoing any medical procedure or treatment, try to avoid blood pressure being taken or needle sticks from occurring on the arm on the side of cancer.   This recommendation begins after surgery and continues for the rest of your life.  This may help reduce your risk of getting lymphedema (swelling in your arm). An excellent resource for those seeking information on lymphedema is the National Lymphedema Network's web site. It can be accessed at [place].org If you notice swelling in your hand, arm or breast at any time following surgery (even if it is many years from now), please contact your doctor or physical therapist to discuss this.  Lymphedema can be treated at any time but it is easier for you if it is treated early on.  If you feel like your shoulder motion is not returning to normal in a reasonable amount of time, please contact your surgeon or physical therapist.  [place] [phone]. [address], [address], [place] [address]  ABC CLASS After Breast Cancer Class  After Breast Cancer Class is a specially designed exercise class to assist you in a safe recover after having breast cancer surgery.  In this class you will learn how to get back to full function whether your drains were just removed or if you had surgery a month ago.  This one-time class is held  the 1st and 3rd Monday of every month from 11:00 a.m. until 12:00 noon virtually.  This class is FREE and space  is limited. For more information or to register for the next available class, call [phone].  Class Goals  Understand specific stretches to improve the flexibility of you chest and shoulder. Learn ways to safely strengthen your upper body and improve your posture. Understand the warning signs of infection and why you may be at risk for an arm infection. Learn about Lymphedema and prevention.  ** You do not attend this class until after surgery.  Drains must be removed to participate  Patient was instructed today in a home exercise program today for post op shoulder range of motion. These included active assist shoulder flexion in sitting, scapular retraction, wall walking with shoulder abduction, and hands behind head external rotation.  She was encouraged to do these twice a day, holding 3 seconds and repeating 5 times when permitted by her physician.    [facility] [place], PT 03/15/2022, 9:44 AM

## 2022-03-16 ENCOUNTER — Encounter: Payer: Self-pay | Admitting: Student

## 2022-03-16 ENCOUNTER — Ambulatory Visit
Admission: RE | Admit: 2022-03-16 | Discharge: 2022-03-16 | Disposition: A | Discharge status: Home / Self Care | Payer: Federal, State, Local not specified - PPO | Source: Ambulatory Visit | Attending: General Surgery | Admitting: General Surgery

## 2022-03-16 DIAGNOSIS — N6311 Unspecified lump in the right breast, upper outer quadrant: Secondary | ICD-10-CM | POA: Diagnosis not present

## 2022-03-16 DIAGNOSIS — C50911 Malignant neoplasm of unspecified site of right female breast: Secondary | ICD-10-CM

## 2022-03-16 MED ORDER — GADOPICLENOL 0.5 MMOL/ML IV SOLN
8.0000 mL | Freq: Once | INTRAVENOUS | Status: AC | PRN
  Administered 2022-03-16: 8 mL via INTRAVENOUS

## 2022-03-17 ENCOUNTER — Inpatient Hospital Stay: Payer: Federal, State, Local not specified - PPO

## 2022-03-17 ENCOUNTER — Inpatient Hospital Stay (HOSPITAL_BASED_OUTPATIENT_CLINIC_OR_DEPARTMENT_OTHER): Payer: Federal, State, Local not specified - PPO | Admitting: Genetic Counselor

## 2022-03-17 ENCOUNTER — Other Ambulatory Visit: Payer: Self-pay | Admitting: Genetic Counselor

## 2022-03-17 ENCOUNTER — Other Ambulatory Visit: Payer: Self-pay | Admitting: Hematology and Oncology

## 2022-03-17 ENCOUNTER — Inpatient Hospital Stay
Payer: Federal, State, Local not specified - PPO | Attending: Hematology and Oncology | Admitting: Hematology and Oncology

## 2022-03-17 ENCOUNTER — Telehealth: Payer: Self-pay | Admitting: Student

## 2022-03-17 ENCOUNTER — Other Ambulatory Visit: Payer: Self-pay | Admitting: *Deleted

## 2022-03-17 ENCOUNTER — Encounter: Payer: Self-pay | Admitting: Genetic Counselor

## 2022-03-17 DIAGNOSIS — Z170 Estrogen receptor positive status [ER+]: Secondary | ICD-10-CM

## 2022-03-17 DIAGNOSIS — C50811 Malignant neoplasm of overlapping sites of right female breast: Secondary | ICD-10-CM

## 2022-03-17 DIAGNOSIS — C773 Secondary and unspecified malignant neoplasm of axilla and upper limb lymph nodes: Secondary | ICD-10-CM | POA: Diagnosis not present

## 2022-03-17 DIAGNOSIS — Z90721 Acquired absence of ovaries, unilateral: Secondary | ICD-10-CM

## 2022-03-17 DIAGNOSIS — R2232 Localized swelling, mass and lump, left upper limb: Secondary | ICD-10-CM

## 2022-03-17 DIAGNOSIS — Z79899 Other long term (current) drug therapy: Secondary | ICD-10-CM

## 2022-03-17 DIAGNOSIS — Z800 Family history of malignant neoplasm of digestive organs: Secondary | ICD-10-CM | POA: Diagnosis not present

## 2022-03-17 DIAGNOSIS — F419 Anxiety disorder, unspecified: Secondary | ICD-10-CM | POA: Diagnosis not present

## 2022-03-17 DIAGNOSIS — Z8249 Family history of ischemic heart disease and other diseases of the circulatory system: Secondary | ICD-10-CM

## 2022-03-17 DIAGNOSIS — I10 Essential (primary) hypertension: Secondary | ICD-10-CM | POA: Insufficient documentation

## 2022-03-17 DIAGNOSIS — Z833 Family history of diabetes mellitus: Secondary | ICD-10-CM | POA: Diagnosis not present

## 2022-03-17 DIAGNOSIS — Z8042 Family history of malignant neoplasm of prostate: Secondary | ICD-10-CM | POA: Diagnosis not present

## 2022-03-17 DIAGNOSIS — Z9049 Acquired absence of other specified parts of digestive tract: Secondary | ICD-10-CM | POA: Insufficient documentation

## 2022-03-17 LAB — GENETIC SCREENING ORDER

## 2022-03-17 NOTE — Telephone Encounter (Signed)
Patient dropped off FMLA paperwork to be completed. Last DOS was 03/14/22. Placed in [employer]

## 2022-03-17 NOTE — Assessment & Plan Note (Addendum)
03/08/2022:Palpable lump in the right breast, mammogram and ultrasound revealed 0.8 cm and 0.8 cm and 0.5 cm masses, 10 cm suspicious calcifications, single enlarged right axillary lymph node 11:00: Grade 2 IDC with IG DCIS ER 70%, PR 90%, Ki-67 20%, HER2 1+ negative Lymph node: Positive UIQ and UOQ right breast: Grade 2 IDC with IG DCIS  Pathology and radiology counseling:Discussed with the patient, the details of pathology including the type of breast cancer,the clinical staging, the significance of ER, PR and HER-2/neu receptors and the implications for treatment. After reviewing the pathology in detail, we proceeded to discuss the different treatment options between surgery, radiation, chemotherapy, antiestrogen therapies.  Recommendations: 1.  Right mastectomy with targeted node dissection 2. MammaPrint testing to determine if chemotherapy would be of any benefit followed by 3. Adjuvant radiation therapy followed by 4. Adjuvant antiestrogen therapy 5.  Genetic testing 6.  Staging scans  Mammaprint counseling: MINDACT is a prospective, randomized phase III controlled trial that investigates the clinical utility of MammaPrint, when compared to standard clinical pathological criteria, with 6,693 patients enrolled from over 111 institutions. Clinical high-risk patients with a Low Risk MammaPrint result, including 48% node-positive, had 5-year distant metastasis-free survival rate in excess of 94 percent, whether randomized to receive adjuvant chemotherapy or not proving MammaPrint's ability to safely identify Low Risk patients.  Breast MRI showed a left axillary lymph node: She is getting ultrasound-guided biopsy on that as well.  Return to clinic after surgery to discuss final pathology report

## 2022-03-17 NOTE — Progress Notes (Signed)
REFERRING PROVIDER: Rolm Bookbinder, MD [address] [place],  [place] [address]  PRIMARY PROVIDER:  [name], DO  PRIMARY REASON FOR VISIT:  Encounter Diagnoses  Name Primary?   Malignant neoplasm of overlapping 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, estrogen receptor positive ([place]) Yes   Family history of prostate cancer    Family history of stomach cancer     HISTORY OF PRESENT ILLNESS:   Ms. Geanine Vandekamp, a 41 y.o. female, was seen for a [place] cancer genetics consultation at the request of Dr. Donne Hazel due to a personal history of breast cancer.  Ms. Suleika Donavan presents to clinic today to discuss the possibility of a hereditary predisposition to cancer, to discuss genetic testing, and to further clarify her future cancer risks, as well as potential cancer risks for family members.   In October 2023, at the age of 26, Ms. Shandria Clinch was diagnosed with invasive ductal carcinoma of the right breast (ER+/PR+/HER2-). The treatment plan is pending.   CANCER HISTORY:  Oncology History  Malignant neoplasm of overlapping 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, estrogen receptor positive ([place])  03/08/2022 Initial Diagnosis   Palpable lump in the right breast, mammogram and ultrasound revealed 0.8 cm and 0.8 cm and 0.5 cm masses, 10 cm suspicious calcifications, single enlarged right axillary lymph node 11:00: Grade 2 IDC with IG DCIS ER 70%, PR 90%, Ki-67 20%, HER2 1+ negative Lymph node: Positive UIQ and UOQ right breast: Grade 2 IDC with IG DCIS   03/17/2022 Cancer Staging   Staging form: Breast, AJCC 8th Edition - Clinical stage from 03/17/2022: Stage IB (cT1b, cN1, cM0, G2, ER+, PR+, HER2-) - Signed by Nicholas Lose, MD on 03/17/2022 Stage prefix: Initial diagnosis Histologic grading system: 3 grade system      RISK FACTORS:  Colonoscopy: no; not examined; R colectomy  Hysterectomy: no.  Ovaries intact: R ovary and FT removed in 2020  Past Medical History:   Diagnosis Date   Anxiety    Chlamydia    Depression    Diabetes mellitus without complication ([place])    Gestational   Gestational diabetes mellitus 12/17/2018   Hypertension    Panic attack with specific phobia 04/11/2010   PIH (pregnancy induced hypertension) 01/27/2016   S/P cesarean section 01/27/2016    Past Surgical History:  Procedure Laterality Date   CESAREAN SECTION     CESAREAN SECTION N/A 01/27/2016   Procedure: CESAREAN SECTION;  Surgeon: Janyth Contes, MD;  Location: [place];  Service: Obstetrics;  Laterality: N/A;  MD requests RNFA   COLON RESECTION N/A 03/30/2019   Procedure: LAPAROSCOPIC RIGHT COLECTOMY WITH RIGHT SALPINGO OOPHORECTOMY TAP BLOCK;  Surgeon: Michael Boston, MD;  [facility];  Service: General;  Laterality: N/A;   LAPAROSCOPIC LYSIS OF ADHESIONS N/A 03/30/2019   Procedure: LAPAROSCOPIC LYSIS OF ADHESIONS;  Surgeon: Michael Boston, MD;  [facility];  Service: General;  Laterality: N/A;   WISDOM TOOTH EXTRACTION       FAMILY HISTORY:  We obtained a detailed, 4-generation family history.  Significant diagnoses are listed below: Family History  Problem Relation Age of Onset   Lung cancer Maternal Aunt        dx after 74   Stomach cancer Maternal Uncle        dx 70s-80s   Prostate cancer Maternal Grandfather 60       d. 95; mets   Cancer Cousin        unknown type; dx  after 31; pat female cousin     Ms. Martina Brodbeck is unaware of previous family history of genetic testing for hereditary cancer risks. There is no reported Ashkenazi Jewish ancestry. There is no known consanguinity.  GENETIC COUNSELING ASSESSMENT: Ms. Jasmain Ahlberg is a 41 y.o. female with a personal history which is somewhat suggestive of a hereditary cancer syndrome and predisposition to cancer given her age of diagnosis. We, therefore, discussed and recommended the following at today's visit.   DISCUSSION: We discussed that 5 - 10% of cancer is hereditary, with most  cases of hereditary breast cancer associated with mutations in BRCA1/2.  There are other genes that can be associated with hereditary breast, prostate, or gastric cancer syndromes.  Type of cancer risk and level of risk are gene-specific. We discussed that testing is beneficial for several reasons including knowing how to follow individuals after completing their treatment, identifying whether potential treatment options would be beneficial, and understanding if other family members could be at risk for cancer and allowing them to undergo genetic testing.   We reviewed the characteristics, features and inheritance patterns of hereditary cancer syndromes. We also discussed genetic testing, including the appropriate family members to test, the process of testing, insurance coverage and turn-around-time for results. We discussed the implications of a negative, positive and/or variant of uncertain significant result. In order to get genetic test results in a timely manner so that Ms. Indea Dearman can use these genetic test results for surgical decisions, we recommended Ms. Leonette Nutting pursue genetic testing for the [employer].  The BRCAplus panel offered by [employer] and includes sequencing and deletion/duplication analysis for the following 8 genes: ATM, BRCA1, BRCA2, CDH1, CHEK2, PALB2, PTEN, and TP53. Once complete, we recommend Ms. Leonette Nutting pursue reflex genetic testing to a more comprehensive gene panel.   The CustomNext-Cancer +RNAinsight (CancerNext + CTNNA1) gene panel offered by [employer] includes sequencing, rearrangement analysis, and RNA analysis for the following 37 genes:   APC, ATM, AXIN2, BARD1, BMPR1A, BRCA1, BRCA2, BRIP1, CDH1, CDK4, CDKN2A, CHEK2, CTNNA1, DICER1, HOXB13, EPCAM, GREM1, MLH1, MSH2, MSH3, MSH6, MUTYH, NBN, NF1, NTHL1, PALB2, PMS2, POLD1, POLE, PTEN, RAD51C, RAD51D, RECQL, SMAD4, SMARCA4, STK11, and TP53.   Based on Ms. Shon Baton Bess's personal history of breast  cancer at age 32, she meets medical criteria for genetic testing. Despite that she meets criteria, she may still have an out of pocket cost. We discussed that if her out of pocket cost for testing is over $100, the laboratory should contact them to discuss self-pay prices, patient pay assistance programs, if applicable, and other billing options.   PLAN: After considering the risks, benefits, and limitations, Ms. Leonette Nutting provided informed consent to pursue genetic testing and the blood sample was sent to [facility] for analysis of the [facility] and CustomNext-Cancer +RNAinsight Panel. Results should be available within approximately 1-2 weeks' time, at which point they will be disclosed by telephone to Ms. Leonette Nutting, as will any additional recommendations warranted by these results. Ms. Camesha Farooq will receive a summary of her genetic counseling visit and a copy of her results once available. This information will also be available in Epic.   Ms. Joetta Delprado questions were answered to her satisfaction today. Our contact information was provided should additional questions or concerns arise. Thank you for the referral and allowing [place] to share in the care of your patient.   Savvy Peeters M. Joette Catching, [place], [facility] Genetic Counselor [email or web address] (P) [phone]  The  patient was seen for a total of 30 minutes in face-to-face genetic counseling.  The patient was seen alone.  Dr. Lindi Adie was available to discuss this case as needed.  _______________________________________________________________________ For Office Staff:  Number of people involved in session: 1 Was an Intern/ student involved with case: no

## 2022-03-17 NOTE — Progress Notes (Signed)
[facility] CONSULT NOTE  Patient Care Team: [name], DO as PCP - General (Family Medicine) Shan Levans, Alcario Drought, MD (Inactive) (Family Medicine)  CHIEF COMPLAINTS/PURPOSE OF CONSULTATION:  Newly diagnosed breast cancer  HISTORY OF PRESENTING ILLNESS:  Brooke Marks 41 y.o. female is here because of recent diagnosis of right breast cancer.  She had a lump in the right breast which was evaluated by mammogram and ultrasound and she had multiple masses measuring 0.8 cm and 0.5 cm.  In addition there was 10 cm of suspicious calcifications and an enlarged right axilla lymph node.  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  She had a breast MRI yesterday which revealed that in addition to the previously known findings in the right breast there was a left axillary lymph node as well.  She was seen by Dr. Donne Hazel and she is here today to discuss adjuvant treatment plan.  I reviewed her records extensively and collaborated the history with the patient.  SUMMARY OF ONCOLOGIC HISTORY: Oncology History  Malignant neoplasm of overlapping 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, estrogen receptor positive ([place])  03/08/2022 Initial Diagnosis   Palpable lump in the right breast, mammogram and ultrasound revealed 0.8 cm and 0.8 cm and 0.5 cm masses, 10 cm suspicious calcifications, single enlarged right axillary lymph node 11:00: Grade 2 IDC with IG DCIS ER 70%, PR 90%, Ki-67 20%, HER2 1+ negative Lymph node: Positive UIQ and UOQ right breast: Grade 2 IDC with IG DCIS   03/17/2022 Cancer Staging   Staging form: Breast, AJCC 8th Edition - Clinical stage from 03/17/2022: Stage IB (cT1b, cN1, cM0, G2, ER+, PR+, HER2-) - Signed by Nicholas Lose, MD on 03/17/2022 Stage prefix: Initial diagnosis Histologic grading system: 3 grade system      MEDICAL HISTORY:  Past Medical History:  Diagnosis  Date   Anxiety    Chlamydia    Depression    Diabetes mellitus without complication ([place])    Gestational   Gestational diabetes mellitus 12/17/2018   Hypertension    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 Rarely uses her cell phone for calls. Usually communicates via text. Affecting her job. Works with [employer]. 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 Concerned that her employer is planning to return her previous position. Denies agoraphobia.      PIH (pregnancy induced hypertension) 01/27/2016   S/P cesarean section 01/27/2016    SURGICAL HISTORY: Past Surgical History:  Procedure Laterality Date   CESAREAN SECTION     CESAREAN SECTION N/A 01/27/2016   Procedure: CESAREAN SECTION;  Surgeon: Janyth Contes, MD;  Location: [facility];  Service: Obstetrics;  Laterality: N/A;  MD requests RNFA   COLON RESECTION N/A 03/30/2019   Procedure: LAPAROSCOPIC RIGHT COLECTOMY WITH RIGHT SALPINGO OOPHORECTOMY TAP BLOCK;  Surgeon: Michael Boston, MD;  Location: [facility];  Service: General;  Laterality: N/A;   LAPAROSCOPIC LYSIS OF ADHESIONS N/A 03/30/2019   Procedure: LAPAROSCOPIC LYSIS OF ADHESIONS;  Surgeon: Michael Boston, MD;  Location: [facility];  Service: General;  Laterality: N/A;   WISDOM TOOTH EXTRACTION      SOCIAL HISTORY: Social History   Socioeconomic History   Marital status: Married    Spouse name: Not on file   Number of children: Not on file   Years of education: Not on  file   Highest education level: Not on file  Occupational History   Not on file  Tobacco Use   Smoking status: Never   Smokeless tobacco: Never  Vaping Use   Vaping Use: Never used  Substance and Sexual Activity   Alcohol use: No   Drug use: No   Sexual activity: Yes    Birth control/protection: None  Other Topics Concern   Not on file  Social History Narrative   ** Merged History Encounter **       Social Determinants of  Health   Financial Resource Strain: Not on file  Food Insecurity: Not on file  Transportation Needs: Not on file  Physical Activity: Not on file  Stress: Not on file  Social Connections: Not on file  Intimate Partner Violence: Not on file    FAMILY HISTORY: Family History  Problem Relation Age of Onset   Diabetes Mother    Hypertension Father     ALLERGIES:  has No Known Allergies.  MEDICATIONS:  Current Outpatient Medications  Medication Sig Dispense Refill   amLODipine (NORVASC) 10 MG tablet TAKE 1 TABLET BY MOUTH EVERY DAY 90 tablet 3   FLUoxetine (PROZAC) 20 MG capsule Take 1 capsule (20 mg total) by mouth daily. 30 capsule 1   Multiple Vitamin (MULTIVITAMIN) tablet Take 1 tablet by mouth daily.     No current facility-administered medications for this visit.    REVIEW OF SYSTEMS:   Constitutional: Denies fevers, chills or abnormal night sweats   All other systems were reviewed with the patient and are negative.  PHYSICAL EXAMINATION: ECOG PERFORMANCE STATUS: 1 - Symptomatic but completely ambulatory  Vitals:   03/17/22 1209  BP: 139/84  Pulse: 98  Resp: 18  Temp: 97.7 F (36.5 C)  SpO2: 100%   Filed Weights   03/17/22 1209  Weight: 173 lb 4.8 oz (78.6 kg)    GENERAL:alert, no distress and comfortable    LABORATORY DATA:  I have reviewed the data as listed Lab Results  Component Value Date   WBC 6.9 04/09/2019   HGB 10.1 (L) 04/09/2019   HCT 30.9 (L) 04/09/2019   MCV 77 (L) 04/09/2019   PLT 445 04/09/2019   Lab Results  Component Value Date   NA 141 04/09/2019   K 3.9 04/09/2019   CL 104 04/09/2019   CO2 26 04/09/2019    RADIOGRAPHIC STUDIES: I have personally reviewed the radiological reports and agreed with the findings in the report.  ASSESSMENT AND PLAN:  Malignant neoplasm of overlapping 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, estrogen receptor positive ([place]) 03/08/2022:Palpable lump in the right breast, mammogram and ultrasound revealed  0.8 cm and 0.8 cm and 0.5 cm masses, 10 cm suspicious calcifications, single enlarged right axillary lymph node 11:00: Grade 2 IDC with IG DCIS ER 70%, PR 90%, Ki-67 20%, HER2 1+ negative Lymph node: Positive UIQ and UOQ right breast: Grade 2 IDC with IG DCIS  Pathology and radiology counseling:Discussed with the patient, the details of pathology including the type of breast cancer,the clinical staging, the significance of ER, PR and HER-2/neu receptors and the implications for treatment. After reviewing the pathology in detail, we proceeded to discuss the different treatment options between surgery, radiation, chemotherapy, antiestrogen therapies.  Recommendations: 1.  Right mastectomy with targeted node dissection 2. MammaPrint testing to determine if chemotherapy would be of any benefit followed by 3. Adjuvant radiation therapy followed by 4. Adjuvant antiestrogen therapy 5.  Genetic testing 6.  Staging scans  Mammaprint counseling: MINDACT is a prospective, randomized phase III controlled trial that investigates the clinical utility of MammaPrint, when compared to standard clinical pathological criteria, with 6,693 patients enrolled from over 111 institutions. Clinical high-risk patients with a Low Risk MammaPrint result, including 48% node-positive, had 5-year distant metastasis-free survival rate in excess of 94 percent, whether randomized to receive adjuvant chemotherapy or not proving MammaPrint's ability to safely identify Low Risk patients.  Breast MRI showed a left axillary lymph node: She is getting ultrasound-guided biopsy on that as well.  Return to clinic after surgery to discuss final pathology report   All questions were answered. The patient knows to call the clinic with any problems, questions or concerns.    Harriette Ohara, MD 03/17/22

## 2022-03-20 ENCOUNTER — Other Ambulatory Visit: Payer: Federal, State, Local not specified - PPO

## 2022-03-20 ENCOUNTER — Encounter: Payer: Self-pay | Admitting: *Deleted

## 2022-03-21 ENCOUNTER — Telehealth: Payer: Self-pay | Admitting: *Deleted

## 2022-03-21 NOTE — Telephone Encounter (Signed)
Spoke with patient to follow up from new patient appt and discuss navigation resources.  Patient denies any questions or concerns at this time. Encouraged her to call should anything arise. Patient verbalized understanding.

## 2022-03-21 NOTE — Telephone Encounter (Signed)
Form was in Teams folder up front.   I have grabbed this and placed in PCP box for review.

## 2022-03-22 ENCOUNTER — Inpatient Hospital Stay
Payer: Federal, State, Local not specified - PPO | Attending: Hematology and Oncology | Admitting: Licensed Clinical Social Worker

## 2022-03-22 ENCOUNTER — Encounter: Payer: Self-pay | Admitting: Student

## 2022-03-22 NOTE — Progress Notes (Signed)
Brooke Marks  Clinical Social Marks was referred by new patient protocol for assessment of psychosocial needs.  Clinical Social Worker contacted patient by phone  to offer support and assess for needs.   Pt reports that she has been adjusting and has had a couple of days now where she has not cried. She has had counseling in the past, not currently, but does have a strong support system of family and friends. Pt lives with her spouse and 39 year old daughter. She has a friend who just finished her own treatment for breast cancer in August and she has been helpful as well.  CSW and patient discussed common feeling and emotions when being diagnosed with cancer, and the importance of support during treatment.  CSW informed patient of the support team and support services at [facility].  CSW provided contact information and encouraged patient to call with any questions or concerns.     [place], [place] Worker [employer]

## 2022-03-24 NOTE — Telephone Encounter (Signed)
Form placed up front for pick up.   Copy made for batch scanning.   Attempted to call patient, however no answer or VM option.   If she calls back please let her know she can pick up forms.

## 2022-03-24 NOTE — Telephone Encounter (Signed)
Patient is calling to see if she would be able to come into the office and pick up the FMLA paper work so she could take it to her Oncologist to complete. She said she was told someone would call her and let her know when she could come get it.I am not sure if Dr. Madison Hickman or a nurse has this paper work.   If someone could please place it at the front desk and call to let her know when it is available.

## 2022-03-28 ENCOUNTER — Ambulatory Visit
Admission: RE | Admit: 2022-03-28 | Discharge: 2022-03-28 | Disposition: A | Discharge status: Home / Self Care | Payer: Federal, State, Local not specified - PPO | Source: Ambulatory Visit | Attending: Hematology and Oncology | Admitting: Hematology and Oncology

## 2022-03-28 ENCOUNTER — Encounter: Payer: Self-pay | Admitting: Genetic Counselor

## 2022-03-28 ENCOUNTER — Other Ambulatory Visit: Payer: Self-pay | Admitting: Hematology and Oncology

## 2022-03-28 ENCOUNTER — Ambulatory Visit (HOSPITAL_COMMUNITY)
Admission: RE | Admit: 2022-03-28 | Discharge: 2022-03-28 | Disposition: A | Discharge status: Home / Self Care | Payer: Federal, State, Local not specified - PPO | Source: Ambulatory Visit | Attending: Hematology and Oncology | Admitting: Hematology and Oncology

## 2022-03-28 ENCOUNTER — Telehealth: Payer: Self-pay | Admitting: Genetic Counselor

## 2022-03-28 DIAGNOSIS — Z1379 Encounter for other screening for genetic and chromosomal anomalies: Secondary | ICD-10-CM | POA: Insufficient documentation

## 2022-03-28 DIAGNOSIS — R2232 Localized swelling, mass and lump, left upper limb: Secondary | ICD-10-CM

## 2022-03-28 DIAGNOSIS — R590 Localized enlarged lymph nodes: Secondary | ICD-10-CM | POA: Diagnosis not present

## 2022-03-28 DIAGNOSIS — Z0389 Encounter for observation for other suspected diseases and conditions ruled out: Secondary | ICD-10-CM | POA: Diagnosis not present

## 2022-03-28 DIAGNOSIS — C50811 Malignant neoplasm of overlapping sites of right female breast: Secondary | ICD-10-CM | POA: Diagnosis not present

## 2022-03-28 DIAGNOSIS — C773 Secondary and unspecified malignant neoplasm of axilla and upper limb lymph nodes: Secondary | ICD-10-CM | POA: Diagnosis not present

## 2022-03-28 DIAGNOSIS — Z170 Estrogen receptor positive status [ER+]: Secondary | ICD-10-CM | POA: Diagnosis not present

## 2022-03-28 DIAGNOSIS — C50919 Malignant neoplasm of unspecified site of unspecified female breast: Secondary | ICD-10-CM | POA: Diagnosis not present

## 2022-03-28 DIAGNOSIS — N6489 Other specified disorders of breast: Secondary | ICD-10-CM | POA: Diagnosis not present

## 2022-03-28 MED ORDER — TECHNETIUM TC 99M MEDRONATE IV KIT
20.0000 | PACK | Freq: Once | INTRAVENOUS | Status: AC | PRN
  Administered 2022-03-28: 20.1 via INTRAVENOUS

## 2022-03-28 NOTE — Telephone Encounter (Signed)
Revealed negative BRCAPlus panel.  Discussed no changes to surgery plan indicated based on this result.  Pan-cancer panel is pending.     Patient expressed interesting in counseling services.  Counseling intern, Jaclyn Shaggy, contacted by in-basket.

## 2022-03-28 NOTE — Telephone Encounter (Signed)
Contacted patient in attempt to disclose results of genetic testing.  LVM with contact information requesting a call back.  

## 2022-03-30 ENCOUNTER — Telehealth: Payer: Self-pay

## 2022-03-30 ENCOUNTER — Ambulatory Visit (HOSPITAL_COMMUNITY)
Admission: RE | Admit: 2022-03-30 | Discharge: 2022-03-30 | Disposition: A | Discharge status: Home / Self Care | Payer: Federal, State, Local not specified - PPO | Source: Ambulatory Visit | Attending: Hematology and Oncology | Admitting: Hematology and Oncology

## 2022-03-30 DIAGNOSIS — R188 Other ascites: Secondary | ICD-10-CM | POA: Diagnosis not present

## 2022-03-30 DIAGNOSIS — C50919 Malignant neoplasm of unspecified site of unspecified female breast: Secondary | ICD-10-CM | POA: Diagnosis not present

## 2022-03-30 DIAGNOSIS — Z170 Estrogen receptor positive status [ER+]: Secondary | ICD-10-CM | POA: Insufficient documentation

## 2022-03-30 DIAGNOSIS — N200 Calculus of kidney: Secondary | ICD-10-CM | POA: Diagnosis not present

## 2022-03-30 DIAGNOSIS — C50811 Malignant neoplasm of overlapping sites of right female breast: Secondary | ICD-10-CM | POA: Diagnosis not present

## 2022-03-30 LAB — POCT I-STAT CREATININE: Creatinine, Ser: 0.5 mg/dL (ref 0.44–1.00)

## 2022-03-30 MED ORDER — SODIUM CHLORIDE (PF) 0.9 % IJ SOLN
INTRAMUSCULAR | Status: AC
  Filled 2022-03-30: qty 50

## 2022-03-30 MED ORDER — IOHEXOL 300 MG/ML  SOLN
100.0000 mL | Freq: Once | INTRAMUSCULAR | Status: AC | PRN
  Administered 2022-03-30: 100 mL via INTRAVENOUS

## 2022-03-30 NOTE — Telephone Encounter (Signed)
[name], counseling intern, called patient after receiving a referral.   The patient reported they are feeling overwhelmed with the diagnosis and are trying to keep things normal for their 39 year old daughter. The patient reported they are still getting tests back and would like to schedule a counseling appointment when they have a better idea of what their treatment schedule will look like.   The counselor told they patient they will call them in two weeks.   [name],  Counseling Intern [phone] [email or web address]

## 2022-03-31 ENCOUNTER — Encounter: Payer: Self-pay | Admitting: *Deleted

## 2022-04-05 ENCOUNTER — Telehealth: Payer: Self-pay | Admitting: *Deleted

## 2022-04-05 ENCOUNTER — Encounter: Payer: Self-pay | Admitting: *Deleted

## 2022-04-05 NOTE — Telephone Encounter (Signed)
Spoke with patient today regarding her scans and the need for a pelvic u/s.  She states she has not heard anything from the surgeon's office. I informed her that I just received a message  that they have been trying to reach her.  She is going to call their office. Informed her we would get the u/s set up. Patient verbalized understanding

## 2022-04-06 ENCOUNTER — Other Ambulatory Visit: Payer: Self-pay | Admitting: Student

## 2022-04-06 ENCOUNTER — Other Ambulatory Visit: Payer: Self-pay | Admitting: *Deleted

## 2022-04-06 ENCOUNTER — Encounter: Payer: Self-pay | Admitting: *Deleted

## 2022-04-06 DIAGNOSIS — F411 Generalized anxiety disorder: Secondary | ICD-10-CM

## 2022-04-06 DIAGNOSIS — C50811 Malignant neoplasm of overlapping sites of right female breast: Secondary | ICD-10-CM

## 2022-04-06 DIAGNOSIS — R935 Abnormal findings on diagnostic imaging of other abdominal regions, including retroperitoneum: Secondary | ICD-10-CM

## 2022-04-07 ENCOUNTER — Ambulatory Visit: Payer: Self-pay | Admitting: Genetic Counselor

## 2022-04-07 ENCOUNTER — Telehealth: Payer: Self-pay | Admitting: Genetic Counselor

## 2022-04-07 DIAGNOSIS — C50811 Malignant neoplasm of overlapping sites of right female breast: Secondary | ICD-10-CM

## 2022-04-07 DIAGNOSIS — Z800 Family history of malignant neoplasm of digestive organs: Secondary | ICD-10-CM

## 2022-04-07 DIAGNOSIS — Z1379 Encounter for other screening for genetic and chromosomal anomalies: Secondary | ICD-10-CM

## 2022-04-07 DIAGNOSIS — Z8042 Family history of malignant neoplasm of prostate: Secondary | ICD-10-CM

## 2022-04-07 NOTE — Progress Notes (Signed)
HPI:   [name] was previously seen in the [facility] clinic due to a personal history of breast cancer and concerns regarding a hereditary predisposition to cancer. Please refer to our prior cancer genetics clinic note for more information regarding our discussion, assessment and recommendations, at the time. [name] recent genetic test results were disclosed to her, as were recommendations warranted by these results. These results and recommendations are discussed in more detail below.  CANCER HISTORY:  Oncology History  Malignant neoplasm of overlapping 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, estrogen receptor positive ([place])  03/08/2022 Initial Diagnosis   Palpable lump in the right breast, mammogram and ultrasound revealed 0.8 cm and 0.8 cm and 0.5 cm masses, 10 cm suspicious calcifications, single enlarged right axillary lymph node 11:00: Grade 2 IDC with IG DCIS ER 70%, PR 90%, Ki-67 20%, HER2 1+ negative Lymph node: Positive UIQ and UOQ right breast: Grade 2 IDC with IG DCIS   03/17/2022 Cancer Staging   Staging form: Breast, AJCC 8th Edition - Clinical stage from 03/17/2022: Stage IB (cT1b, cN1, cM0, G2, ER+, PR+, HER2-) - Signed by Nicholas Lose, MD on 03/17/2022 Stage prefix: Initial diagnosis Histologic grading system: 3 grade system   03/30/2022 Genetic Testing   Negative [facility] CustomNext-Cancer +RNAinsight Panel.  Report date is 03/30/2022.   The CustomNext-Cancer+RNAinsight panel offered by Althia Forts includes sequencing and rearrangement analysis for the following 37 genes:  APC, ATM, BARD1, BMPR1A, BRCA1, BRCA2, BRIP1, CDH1, CDK4, CDKN2A, CHEK2, DICER1, MLH1, MSH2, MSH6, MUTYH, NBN, NF1, NTHL1, PALB2, PMS2, PTEN, RAD51C, RAD51D, RECQL, SMAD4, SMARCA4, STK11 and TP53 (sequencing and deletion/duplication); AXIN2, CTNNA1, HOXB13, MSH3, POLD1 and POLE (sequencing only); EPCAM and GREM1 (deletion/duplication only). RNA data is routinely analyzed for use in  variant interpretation for all genes     FAMILY HISTORY:  We obtained a detailed, 4-generation family history.  Significant diagnoses are listed below:      Family History  Problem Relation Age of Onset   Lung cancer Maternal Aunt          dx after 22   Stomach cancer Maternal Uncle          dx 70s-80s   Prostate cancer Maternal Grandfather 60        d. 9; mets   Cancer Cousin          unknown type; dx after 40; pat female cousin       [name] is unaware of previous family history of genetic testing for hereditary cancer risks. There is no reported Ashkenazi Jewish ancestry. There is no known consanguinity.  GENETIC TEST RESULTS:  The [facility] CustomNext-Cancer +RNAinsight Panel found no pathogenic mutations.   The CustomNext-Cancer+RNAinsight panel offered by Althia Forts includes sequencing and rearrangement analysis for the following 37 genes:  APC, ATM, BARD1, BMPR1A, BRCA1, BRCA2, BRIP1, CDH1, CDK4, CDKN2A, CHEK2, DICER1, MLH1, MSH2, MSH6, MUTYH, NBN, NF1, NTHL1, PALB2, PMS2, PTEN, RAD51C, RAD51D, RECQL, SMAD4, SMARCA4, STK11 and TP53 (sequencing and deletion/duplication); AXIN2, CTNNA1, HOXB13, MSH3, POLD1 and POLE (sequencing only); EPCAM and GREM1 (deletion/duplication only). RNA data is routinely analyzed for use in variant interpretation for all genes .   The test report has been scanned into EPIC and is located under the Molecular Pathology section of the Results Review tab.  A portion of the result report is included below for reference. Genetic testing reported out on March 30, 2022.      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  At this time, it is unknown if this variant is associated with an increased risk for cancer or if it is benign, but most uncertain variants are reclassified to benign. It should not be used to make medical management decisions. With time, we suspect the  laboratory will determine the significance of this variant, if any. If the laboratory reclassifies this variant, we will attempt to contact Brooke Marks to discuss it further.   Even though a pathogenic variant was not identified, possible explanations for the cancer in the family may include: There may be no hereditary risk for cancer in the family. The cancers in Brooke Marks and/or her family may be sporadic/familial or due to other genetic and environmental factors. There may be a gene mutation in one of these genes that current testing methods cannot detect but that chance is small. There could be another gene that has not yet been discovered, or that we have not yet tested, that is responsible for the cancer diagnoses in the family.  It is also possible there is a hereditary cause for the cancer in the family that Brooke Marks did not inherit.   Therefore, it is important to remain in touch with cancer genetics in the future so that we can continue to offer [name] the most up to date genetic testing.      ADDITIONAL GENETIC TESTING:  We discussed with [name] that her genetic testing was fairly extensive.  If there are additional relevant genes identified to increase cancer risk that can be analyzed in the future, we would be happy to discuss and coordinate this testing at that time.    CANCER SCREENING RECOMMENDATIONS:  [name] test result is considered negative (normal).  This means that we have not identified a hereditary cause for her personal history of breast cancer at this time.   An individual's cancer risk and medical management are not determined by genetic test results alone. Overall cancer risk assessment incorporates additional factors, including personal medical history, family history, and any available genetic information that may result in a personalized plan for cancer prevention and surveillance. Therefore, it is recommended she continue  to follow the cancer management and screening guidelines provided by her oncology and primary healthcare provider.    RECOMMENDATIONS FOR FAMILY MEMBERS:   Since she did not inherit a identifiable mutation in a cancer predisposition gene included on this panel, her children could not have inherited a known mutation from her in one of these genes. Individuals in this family might be at some increased risk of developing cancer, over the general population risk, due to the family history of cancer.  Individuals in the family should notify their providers of the family history of cancer. We recommend women in this family have a yearly mammogram beginning at age 21, or 72 years younger than the earliest onset of cancer, an annual clinical breast exam, and perform monthly breast self-exams.   We do not recommend familial testing for the RECQL variant of uncertain significance (VUS).  FOLLOW-UP:  Lastly, we discussed with [name] that cancer genetics is a rapidly advancing field and it is possible that new genetic tests will be appropriate for her and/or her family members in the future. We encouraged her to remain in contact with cancer genetics on an annual basis so we can update her personal and family histories and let her  know of advances in cancer genetics that may benefit this family.   Our contact number was provided. [name]'s questions were answered to her satisfaction, and she knows she is welcome to call us at anytime with additional questions or concerns.   Ahijah Devery M. Joette Catching, [place], [facility] Genetic Counselor [email or web address] (P) [phone]

## 2022-04-07 NOTE — Telephone Encounter (Signed)
Revealed negative pan-cancer panel and VUS in RECQL.

## 2022-04-11 ENCOUNTER — Ambulatory Visit (INDEPENDENT_AMBULATORY_CARE_PROVIDER_SITE_OTHER): Payer: Federal, State, Local not specified - PPO | Admitting: Student

## 2022-04-11 ENCOUNTER — Encounter: Payer: Self-pay | Admitting: *Deleted

## 2022-04-11 ENCOUNTER — Encounter: Payer: Self-pay | Admitting: Student

## 2022-04-11 VITALS — BP 114/62 | HR 78 | Wt 170.0 lb

## 2022-04-11 DIAGNOSIS — E7849 Other hyperlipidemia: Secondary | ICD-10-CM | POA: Diagnosis not present

## 2022-04-11 DIAGNOSIS — F411 Generalized anxiety disorder: Secondary | ICD-10-CM | POA: Diagnosis not present

## 2022-04-11 DIAGNOSIS — I10 Essential (primary) hypertension: Secondary | ICD-10-CM | POA: Diagnosis not present

## 2022-04-11 DIAGNOSIS — E785 Hyperlipidemia, unspecified: Secondary | ICD-10-CM | POA: Insufficient documentation

## 2022-04-11 NOTE — Assessment & Plan Note (Signed)
Discontinued SSRI. Excellent support at home in the form of her husband and she is not interested in taking medications at this time.  Follow-up as needed.

## 2022-04-11 NOTE — Assessment & Plan Note (Signed)
BP: 114/62 today. Well controlled. Goal of <130/80. Continue to work on healthy dietary habits and exercise. Medication regimen: Amlodipine 10 mg daily

## 2022-04-11 NOTE — Progress Notes (Signed)
  SUBJECTIVE:   CHIEF COMPLAINT / HPI:   Hypertension: BP: 114/62 today. Home medications include: amlodipine '10mg'$  daily. She endorses taking these medications as prescribed. Does not check blood pressure at home. She walks and dances with her daughter. Additionally, has made dietary changes because she was told she had a fatty liver on her most recent CT abdomen. Patient has had a BMP in the past 1 year.  Anxiety/depression: 2/2 diagnosis of breast cancer. She states she is doing better now that she is closer to getting her cancer treatment underway. She is smiling and laughing more. GAD score of 9. She has not been taking the Prozac because she has good family support at home and does not want to be taking medication for this.  Hyperlipidemia: LDL of 111.   PERTINENT  PMH / PSH: HTN, invasive ductal carcinoma of right breast  OBJECTIVE:  BP 114/62   Pulse 78   Wt 170 lb (77.1 kg)   LMP 04/07/2022   BMI 31.09 kg/m  General: Awake, alert, NAD, in bright spirits CV: RRR, no murmurs auscultated Pulm: CTAB, normal WOB  ASSESSMENT/PLAN:  Primary hypertension Assessment & Plan: BP: 114/62 today. Well controlled. Goal of <130/80. Continue to work on healthy dietary habits and exercise. Medication regimen: Amlodipine 10 mg daily   Anxiety state Assessment & Plan: Discontinued SSRI. Excellent support at home in the form of her husband and she is not interested in taking medications at this time.  Follow-up as needed.   Other hyperlipidemia Assessment & Plan: LDL 111 on 02/14/2022.  She has made recent dietary and exercise changes in her life.  Discussed statin medication and fetal toxicity related to this should she get pregnant. Her mother is on Crestor. Not currently on method of birth control.  Plan to continue lifestyle changes and recheck LDL in January. Patient agreeable with plan.   Return in about 2 months (around 06/11/2022) for Hyperlipidemia, check in. [name],  DO 04/11/2022, 8:59 AM PGY-2, [place]

## 2022-04-11 NOTE — Assessment & Plan Note (Signed)
LDL 111 on 02/14/2022.  She has made recent dietary and exercise changes in her life.  Discussed statin medication and fetal toxicity related to this should she get pregnant. Her mother is on Crestor. Not currently on method of birth control.  Plan to continue lifestyle changes and recheck LDL in January. Patient agreeable with plan.

## 2022-04-11 NOTE — Patient Instructions (Signed)
It was great to see you today! Thank you for choosing [facility] for your primary care. Charlize Leonette Nutting was seen for hypertension, anxiety.  Today we addressed: Hypertension: Please continue to take your amlodipine.  Blood pressure looks great. Anxiety: I am glad to hear that you have gotten good answers regarding your cancer treatment.  Should you be interested in discussing this further, I am always here. High cholesterol: Great job on the dietary and exercise changes in your life.  We will recheck your cholesterol in the new year before considering starting a statin medication.  If you haven't already, sign up for My Chart to have easy access to your labs results, and communication with your primary care physician.  Call the clinic at [phone] if your symptoms worsen or you have any concerns.  You should return to our clinic Return in about 2 months (around 06/11/2022) for Hyperlipidemia, check in. Please arrive 15 minutes before your appointment to ensure smooth check in process.  We appreciate your efforts in making this happen.  Thank you for allowing me to participate in your care, [name], DO 04/11/2022, 8:50 AM PGY-2, [place]

## 2022-04-12 DIAGNOSIS — C773 Secondary and unspecified malignant neoplasm of axilla and upper limb lymph nodes: Secondary | ICD-10-CM | POA: Diagnosis not present

## 2022-04-12 DIAGNOSIS — C50911 Malignant neoplasm of unspecified site of right female breast: Secondary | ICD-10-CM | POA: Diagnosis not present

## 2022-04-17 ENCOUNTER — Telehealth: Payer: Self-pay

## 2022-04-17 NOTE — Telephone Encounter (Signed)
Brooke Marks, counseling intern, called patient to schedule counseling appointment.   Patient scheduled a counseling session for Thursday, December 7th at [facility],  Counseling Intern  [phone] [email or web address]

## 2022-04-18 ENCOUNTER — Ambulatory Visit (HOSPITAL_COMMUNITY)
Admission: RE | Admit: 2022-04-18 | Discharge: 2022-04-18 | Disposition: A | Discharge status: Home / Self Care | Payer: Federal, State, Local not specified - PPO | Source: Ambulatory Visit | Attending: Hematology and Oncology | Admitting: Hematology and Oncology

## 2022-04-18 DIAGNOSIS — N83202 Unspecified ovarian cyst, left side: Secondary | ICD-10-CM | POA: Diagnosis not present

## 2022-04-18 DIAGNOSIS — Z170 Estrogen receptor positive status [ER+]: Secondary | ICD-10-CM | POA: Insufficient documentation

## 2022-04-18 DIAGNOSIS — R935 Abnormal findings on diagnostic imaging of other abdominal regions, including retroperitoneum: Secondary | ICD-10-CM | POA: Insufficient documentation

## 2022-04-18 DIAGNOSIS — Z90722 Acquired absence of ovaries, bilateral: Secondary | ICD-10-CM | POA: Diagnosis not present

## 2022-04-18 DIAGNOSIS — C50811 Malignant neoplasm of overlapping sites of right female breast: Secondary | ICD-10-CM | POA: Diagnosis not present

## 2022-04-19 ENCOUNTER — Encounter: Payer: Self-pay | Admitting: *Deleted

## 2022-04-19 DIAGNOSIS — C50811 Malignant neoplasm of overlapping sites of right female breast: Secondary | ICD-10-CM | POA: Diagnosis not present

## 2022-04-19 DIAGNOSIS — Z170 Estrogen receptor positive status [ER+]: Secondary | ICD-10-CM | POA: Diagnosis not present

## 2022-04-21 ENCOUNTER — Other Ambulatory Visit: Payer: Self-pay | Admitting: General Surgery

## 2022-04-21 DIAGNOSIS — Z170 Estrogen receptor positive status [ER+]: Secondary | ICD-10-CM

## 2022-04-26 DIAGNOSIS — C50911 Malignant neoplasm of unspecified site of right female breast: Secondary | ICD-10-CM | POA: Diagnosis not present

## 2022-05-01 ENCOUNTER — Other Ambulatory Visit: Payer: Self-pay

## 2022-05-01 ENCOUNTER — Encounter (HOSPITAL_BASED_OUTPATIENT_CLINIC_OR_DEPARTMENT_OTHER): Payer: Self-pay | Admitting: General Surgery

## 2022-05-05 ENCOUNTER — Ambulatory Visit
Admission: RE | Admit: 2022-05-05 | Discharge: 2022-05-05 | Disposition: A | Discharge status: Home / Self Care | Payer: Federal, State, Local not specified - PPO | Source: Ambulatory Visit | Attending: General Surgery | Admitting: General Surgery

## 2022-05-05 ENCOUNTER — Encounter (HOSPITAL_BASED_OUTPATIENT_CLINIC_OR_DEPARTMENT_OTHER)
Admission: RE | Admit: 2022-05-05 | Discharge: 2022-05-05 | Disposition: A | Discharge status: Home / Self Care | Payer: Federal, State, Local not specified - PPO | Source: Ambulatory Visit | Attending: General Surgery | Admitting: General Surgery

## 2022-05-05 ENCOUNTER — Other Ambulatory Visit: Payer: Self-pay | Admitting: General Surgery

## 2022-05-05 DIAGNOSIS — Z01810 Encounter for preprocedural cardiovascular examination: Secondary | ICD-10-CM | POA: Diagnosis not present

## 2022-05-05 DIAGNOSIS — C50811 Malignant neoplasm of overlapping sites of right female breast: Secondary | ICD-10-CM

## 2022-05-05 DIAGNOSIS — R590 Localized enlarged lymph nodes: Secondary | ICD-10-CM | POA: Diagnosis not present

## 2022-05-05 DIAGNOSIS — I10 Essential (primary) hypertension: Secondary | ICD-10-CM | POA: Insufficient documentation

## 2022-05-05 HISTORY — PX: BREAST BIOPSY: SHX20

## 2022-05-05 MED ORDER — ENSURE PRE-SURGERY PO LIQD: 296.0000 mL | Freq: Once | ORAL | Status: DC

## 2022-05-05 NOTE — Progress Notes (Signed)
       Patient Instructions  The night before surgery:  No food after midnight. ONLY clear liquids after midnight  The day of surgery (if you do NOT have diabetes):  Drink ONE (1) Pre-Surgery Clear Ensure as directed.   This drink was given to you during your hospital  pre-op appointment visit. The pre-op nurse will instruct you on the time to drink the  Pre-Surgery Ensure depending on your surgery time. Finish the drink at the designated time by the pre-op nurse.  Nothing else to drink after completing the  Pre-Surgery Clear Ensure.  The day of surgery (if you have diabetes): Drink ONE (1) Gatorade 2 (G2) as directed. This drink was given to you during your hospital  pre-op appointment visit.  The pre-op nurse will instruct you on the time to drink the   Gatorade 2 (G2) depending on your surgery time. Color of the Gatorade may vary. Red is not allowed. Nothing else to drink after completing the  Gatorade 2 (G2).         If you have questions, please contact your surgeon's office.  Patient given CHG soap with written and verbal instruction. Patient verbalized understanding.

## 2022-05-07 NOTE — Anesthesia Preprocedure Evaluation (Signed)
Anesthesia Evaluation  Patient identified by MRN, date of birth, ID band Patient awake    Reviewed: Allergy & Precautions, NPO status , Patient's Chart, lab work & pertinent test results  History of Anesthesia Complications Negative for: history of anesthetic complications  Airway Mallampati: II  TM Distance: >3 FB Neck ROM: Full    Dental no notable dental hx. (+) Dental Advisory Given   Pulmonary neg pulmonary ROS   Pulmonary exam normal        Cardiovascular hypertension, Pt. on medications Normal cardiovascular exam     Neuro/Psych  PSYCHIATRIC DISORDERS Anxiety Depression    negative neurological ROS     GI/Hepatic negative GI ROS, Neg liver ROS,,,  Endo/Other  diabetes    Renal/GU negative Renal ROS     Musculoskeletal negative musculoskeletal ROS (+)    Abdominal   Peds  Hematology negative hematology ROS (+)   Anesthesia Other Findings   Reproductive/Obstetrics                             Anesthesia Physical Anesthesia Plan  ASA: 2  Anesthesia Plan: General   Post-op Pain Management: Celebrex PO (pre-op)*, Tylenol PO (pre-op)* and Regional block*   Induction:   PONV Risk Score and Plan: 4 or greater and Ondansetron, Dexamethasone, TIVA, Midazolam and Scopolamine patch - Pre-op  Airway Management Planned: LMA  Additional Equipment:   Intra-op Plan:   Post-operative Plan: Extubation in OR  Informed Consent: I have reviewed the patients History and Physical, chart, labs and discussed the procedure including the risks, benefits and alternatives for the proposed anesthesia with the patient or authorized representative who has indicated his/her understanding and acceptance.     Dental advisory given  Plan Discussed with: Anesthesiologist and CRNA  Anesthesia Plan Comments:        Anesthesia Quick Evaluation

## 2022-05-08 ENCOUNTER — Observation Stay (HOSPITAL_BASED_OUTPATIENT_CLINIC_OR_DEPARTMENT_OTHER)
Admission: RE | Admit: 2022-05-08 | Discharge: 2022-05-09 | Disposition: A | Discharge status: Home / Self Care | Payer: Federal, State, Local not specified - PPO | Attending: General Surgery | Admitting: General Surgery

## 2022-05-08 ENCOUNTER — Ambulatory Visit (HOSPITAL_BASED_OUTPATIENT_CLINIC_OR_DEPARTMENT_OTHER): Payer: Federal, State, Local not specified - PPO | Admitting: Anesthesiology

## 2022-05-08 ENCOUNTER — Encounter (HOSPITAL_BASED_OUTPATIENT_CLINIC_OR_DEPARTMENT_OTHER): Payer: Self-pay | Admitting: General Surgery

## 2022-05-08 ENCOUNTER — Encounter (HOSPITAL_BASED_OUTPATIENT_CLINIC_OR_DEPARTMENT_OTHER)
Admission: RE | Disposition: A | Discharge status: Home / Self Care | Payer: Self-pay | Source: Home / Self Care | Attending: General Surgery

## 2022-05-08 ENCOUNTER — Other Ambulatory Visit: Payer: Self-pay

## 2022-05-08 ENCOUNTER — Ambulatory Visit
Admission: RE | Admit: 2022-05-08 | Discharge: 2022-05-08 | Disposition: A | Discharge status: Home / Self Care | Payer: Federal, State, Local not specified - PPO | Source: Ambulatory Visit | Attending: General Surgery | Admitting: General Surgery

## 2022-05-08 DIAGNOSIS — C50811 Malignant neoplasm of overlapping sites of right female breast: Secondary | ICD-10-CM

## 2022-05-08 DIAGNOSIS — Z01818 Encounter for other preprocedural examination: Secondary | ICD-10-CM

## 2022-05-08 DIAGNOSIS — I10 Essential (primary) hypertension: Secondary | ICD-10-CM | POA: Insufficient documentation

## 2022-05-08 DIAGNOSIS — C50911 Malignant neoplasm of unspecified site of right female breast: Secondary | ICD-10-CM | POA: Diagnosis not present

## 2022-05-08 DIAGNOSIS — R928 Other abnormal and inconclusive findings on diagnostic imaging of breast: Secondary | ICD-10-CM | POA: Diagnosis not present

## 2022-05-08 DIAGNOSIS — Z79899 Other long term (current) drug therapy: Secondary | ICD-10-CM | POA: Diagnosis not present

## 2022-05-08 DIAGNOSIS — C773 Secondary and unspecified malignant neoplasm of axilla and upper limb lymph nodes: Secondary | ICD-10-CM | POA: Diagnosis not present

## 2022-05-08 DIAGNOSIS — Z9011 Acquired absence of right breast and nipple: Secondary | ICD-10-CM

## 2022-05-08 DIAGNOSIS — G8918 Other acute postprocedural pain: Secondary | ICD-10-CM | POA: Diagnosis not present

## 2022-05-08 DIAGNOSIS — N641 Fat necrosis of breast: Secondary | ICD-10-CM | POA: Diagnosis not present

## 2022-05-08 DIAGNOSIS — R921 Mammographic calcification found on diagnostic imaging of breast: Secondary | ICD-10-CM | POA: Diagnosis not present

## 2022-05-08 HISTORY — PX: MASTECTOMY W/ SENTINEL NODE BIOPSY: SHX2001

## 2022-05-08 HISTORY — PX: RADIOACTIVE SEED GUIDED AXILLARY SENTINEL LYMPH NODE: SHX6735

## 2022-05-08 HISTORY — DX: Malignant (primary) neoplasm, unspecified: C80.1

## 2022-05-08 HISTORY — DX: Nausea with vomiting, unspecified: R11.2

## 2022-05-08 HISTORY — DX: Other specified postprocedural states: Z98.890

## 2022-05-08 LAB — POCT PREGNANCY, URINE: Preg Test, Ur: NEGATIVE

## 2022-05-08 SURGERY — MASTECTOMY WITH SENTINEL LYMPH NODE BIOPSY
Anesthesia: General | Site: Breast | Laterality: Right

## 2022-05-08 MED ORDER — PROPOFOL 500 MG/50ML IV EMUL
INTRAVENOUS | Status: DC | PRN
  Administered 2022-05-08: 175 ug/kg/min via INTRAVENOUS

## 2022-05-08 MED ORDER — MIDAZOLAM HCL 2 MG/2ML IJ SOLN
INTRAMUSCULAR | Status: AC
  Filled 2022-05-08: qty 2

## 2022-05-08 MED ORDER — FENTANYL CITRATE (PF) 100 MCG/2ML IJ SOLN
INTRAMUSCULAR | Status: DC | PRN
  Administered 2022-05-08 (×2): 50 ug via INTRAVENOUS
  Administered 2022-05-08 (×4): 25 ug via INTRAVENOUS

## 2022-05-08 MED ORDER — ACETAMINOPHEN 500 MG PO TABS: 1000.0000 mg | ORAL_TABLET | Freq: Once | ORAL | Status: DC

## 2022-05-08 MED ORDER — ONDANSETRON 4 MG PO TBDP: 4.0000 mg | ORAL_TABLET | Freq: Four times a day (QID) | ORAL | Status: DC | PRN

## 2022-05-08 MED ORDER — CELECOXIB 200 MG PO CAPS
200.0000 mg | ORAL_CAPSULE | Freq: Once | ORAL | Status: AC
  Administered 2022-05-08: 200 mg via ORAL

## 2022-05-08 MED ORDER — ONDANSETRON HCL 4 MG/2ML IJ SOLN: 4.0000 mg | Freq: Four times a day (QID) | INTRAMUSCULAR | Status: DC | PRN

## 2022-05-08 MED ORDER — SODIUM CHLORIDE 0.9 % IV SOLN: INTRAVENOUS | Status: DC

## 2022-05-08 MED ORDER — MORPHINE SULFATE (PF) 4 MG/ML IV SOLN: 1.0000 mg | INTRAVENOUS | Status: DC | PRN

## 2022-05-08 MED ORDER — PROPOFOL 10 MG/ML IV BOLUS
INTRAVENOUS | Status: DC | PRN
  Administered 2022-05-08: 200 mg via INTRAVENOUS

## 2022-05-08 MED ORDER — DEXAMETHASONE SODIUM PHOSPHATE 10 MG/ML IJ SOLN
INTRAMUSCULAR | Status: AC
  Filled 2022-05-08: qty 1

## 2022-05-08 MED ORDER — ACETAMINOPHEN 500 MG PO TABS
1000.0000 mg | ORAL_TABLET | ORAL | Status: AC
  Administered 2022-05-08: 1000 mg via ORAL

## 2022-05-08 MED ORDER — HEPARIN SOD (PORK) LOCK FLUSH 100 UNIT/ML IV SOLN
INTRAVENOUS | Status: AC
  Filled 2022-05-08: qty 5

## 2022-05-08 MED ORDER — CEFAZOLIN SODIUM-DEXTROSE 2-4 GM/100ML-% IV SOLN
INTRAVENOUS | Status: AC
  Filled 2022-05-08: qty 100

## 2022-05-08 MED ORDER — ACETAMINOPHEN 500 MG PO TABS
ORAL_TABLET | ORAL | Status: AC
  Filled 2022-05-08: qty 2

## 2022-05-08 MED ORDER — HEMOSTATIC AGENTS (NO CHARGE) OPTIME
TOPICAL | Status: DC | PRN
  Administered 2022-05-08: 1 via TOPICAL

## 2022-05-08 MED ORDER — AMISULPRIDE (ANTIEMETIC) 5 MG/2ML IV SOLN: 10.0000 mg | Freq: Once | INTRAVENOUS | Status: DC | PRN

## 2022-05-08 MED ORDER — BUPIVACAINE HCL (PF) 0.5 % IJ SOLN
INTRAMUSCULAR | Status: DC | PRN
  Administered 2022-05-08: 15 mL

## 2022-05-08 MED ORDER — SIMETHICONE 80 MG PO CHEW: 40.0000 mg | CHEWABLE_TABLET | Freq: Four times a day (QID) | ORAL | Status: DC | PRN

## 2022-05-08 MED ORDER — CHLORHEXIDINE GLUCONATE CLOTH 2 % EX PADS: 6.0000 | MEDICATED_PAD | Freq: Once | CUTANEOUS | Status: DC

## 2022-05-08 MED ORDER — HEPARIN (PORCINE) IN NACL 1000-0.9 UT/500ML-% IV SOLN
INTRAVENOUS | Status: AC
  Filled 2022-05-08: qty 500

## 2022-05-08 MED ORDER — PROMETHAZINE HCL 25 MG/ML IJ SOLN: 6.2500 mg | INTRAMUSCULAR | Status: DC | PRN

## 2022-05-08 MED ORDER — FENTANYL CITRATE (PF) 100 MCG/2ML IJ SOLN
INTRAMUSCULAR | Status: AC
  Filled 2022-05-08: qty 2

## 2022-05-08 MED ORDER — DEXAMETHASONE SODIUM PHOSPHATE 10 MG/ML IJ SOLN
INTRAMUSCULAR | Status: DC | PRN
  Administered 2022-05-08: 5 mg via INTRAVENOUS

## 2022-05-08 MED ORDER — LACTATED RINGERS IV SOLN: INTRAVENOUS | Status: DC

## 2022-05-08 MED ORDER — MIDAZOLAM HCL 5 MG/5ML IJ SOLN
INTRAMUSCULAR | Status: DC | PRN
  Administered 2022-05-08: 2 mg via INTRAVENOUS

## 2022-05-08 MED ORDER — AMLODIPINE BESYLATE 10 MG PO TABS
10.0000 mg | ORAL_TABLET | Freq: Every day | ORAL | Status: DC
  Filled 2022-05-08: qty 1

## 2022-05-08 MED ORDER — PROPOFOL 10 MG/ML IV BOLUS
INTRAVENOUS | Status: AC
  Filled 2022-05-08: qty 20

## 2022-05-08 MED ORDER — ONDANSETRON HCL 4 MG/2ML IJ SOLN
INTRAMUSCULAR | Status: AC
  Filled 2022-05-08: qty 2

## 2022-05-08 MED ORDER — METHOCARBAMOL 1000 MG/10ML IJ SOLN
500.0000 mg | Freq: Three times a day (TID) | INTRAVENOUS | Status: DC | PRN
  Filled 2022-05-08: qty 5

## 2022-05-08 MED ORDER — ACETAMINOPHEN 500 MG PO TABS
1000.0000 mg | ORAL_TABLET | Freq: Four times a day (QID) | ORAL | Status: DC
  Filled 2022-05-08 (×2): qty 2

## 2022-05-08 MED ORDER — OXYCODONE HCL 5 MG PO TABS
5.0000 mg | ORAL_TABLET | ORAL | Status: DC | PRN
  Administered 2022-05-08: 5 mg via ORAL
  Filled 2022-05-08: qty 1

## 2022-05-08 MED ORDER — SCOPOLAMINE 1 MG/3DAYS TD PT72
1.0000 | MEDICATED_PATCH | TRANSDERMAL | Status: DC
  Administered 2022-05-08: 1.5 mg via TRANSDERMAL

## 2022-05-08 MED ORDER — FENTANYL CITRATE (PF) 100 MCG/2ML IJ SOLN: 100.0000 ug | Freq: Once | INTRAMUSCULAR | Status: DC

## 2022-05-08 MED ORDER — SCOPOLAMINE 1 MG/3DAYS TD PT72
MEDICATED_PATCH | TRANSDERMAL | Status: AC
  Filled 2022-05-08: qty 1

## 2022-05-08 MED ORDER — PROPOFOL 500 MG/50ML IV EMUL
INTRAVENOUS | Status: AC
  Filled 2022-05-08: qty 50

## 2022-05-08 MED ORDER — BUPIVACAINE LIPOSOME 1.3 % IJ SUSP
INTRAMUSCULAR | Status: DC | PRN
  Administered 2022-05-08: 10 mL

## 2022-05-08 MED ORDER — CELECOXIB 200 MG PO CAPS
ORAL_CAPSULE | ORAL | Status: AC
  Filled 2022-05-08: qty 1

## 2022-05-08 MED ORDER — METHOCARBAMOL 500 MG PO TABS: 500.0000 mg | ORAL_TABLET | Freq: Three times a day (TID) | ORAL | Status: DC | PRN

## 2022-05-08 MED ORDER — BUPIVACAINE HCL (PF) 0.25 % IJ SOLN
INTRAMUSCULAR | Status: AC
  Filled 2022-05-08: qty 210

## 2022-05-08 MED ORDER — ONDANSETRON HCL 4 MG/2ML IJ SOLN
INTRAMUSCULAR | Status: DC | PRN
  Administered 2022-05-08: 4 mg via INTRAVENOUS

## 2022-05-08 MED ORDER — MAGTRACE LYMPHATIC TRACER
INTRAMUSCULAR | Status: DC | PRN
  Administered 2022-05-08: 2 mL via INTRAMUSCULAR

## 2022-05-08 MED ORDER — FENTANYL CITRATE (PF) 100 MCG/2ML IJ SOLN
25.0000 ug | INTRAMUSCULAR | Status: DC | PRN
  Administered 2022-05-08: 50 ug via INTRAVENOUS
  Administered 2022-05-08: 25 ug via INTRAVENOUS

## 2022-05-08 MED ORDER — MIDAZOLAM HCL 2 MG/2ML IJ SOLN: 2.0000 mg | Freq: Once | INTRAMUSCULAR | Status: DC

## 2022-05-08 MED ORDER — CEFAZOLIN SODIUM-DEXTROSE 2-4 GM/100ML-% IV SOLN
2.0000 g | INTRAVENOUS | Status: AC
  Administered 2022-05-08: 2 g via INTRAVENOUS

## 2022-05-08 MED ORDER — LIDOCAINE 2% (20 MG/ML) 5 ML SYRINGE
INTRAMUSCULAR | Status: AC
  Filled 2022-05-08: qty 5

## 2022-05-08 SURGICAL SUPPLY — 64 items
APPLIER CLIP 11 MED OPEN (CLIP) ×2
BINDER BREAST LRG (GAUZE/BANDAGES/DRESSINGS) IMPLANT
BINDER BREAST XLRG (GAUZE/BANDAGES/DRESSINGS) IMPLANT
BIOPATCH RED 1 DISK 7.0 (GAUZE/BANDAGES/DRESSINGS) IMPLANT
BLADE HEX COATED 2.75 (ELECTRODE) IMPLANT
BLADE SURG 10 STRL SS (BLADE) ×2 IMPLANT
BLADE SURG 15 STRL LF DISP TIS (BLADE) ×2 IMPLANT
BLADE SURG 15 STRL SS (BLADE) ×2
CANISTER SUCT 1200ML W/VALVE (MISCELLANEOUS) ×2 IMPLANT
CHLORAPREP W/TINT 26 (MISCELLANEOUS) ×2 IMPLANT
CLIP APPLIE 11 MED OPEN (CLIP) IMPLANT
COVER BACK TABLE 60X90IN (DRAPES) ×2 IMPLANT
COVER MAYO STAND STRL (DRAPES) ×2 IMPLANT
COVER PROBE CYLINDRICAL 5X96 (MISCELLANEOUS) ×2 IMPLANT
DERMABOND ADVANCED .7 DNX12 (GAUZE/BANDAGES/DRESSINGS) ×2 IMPLANT
DRAIN CHANNEL 19F RND (DRAIN) ×2 IMPLANT
DRAPE TOP ARMCOVERS (MISCELLANEOUS) ×2 IMPLANT
DRAPE U-SHAPE 76X120 STRL (DRAPES) ×2 IMPLANT
DRAPE UTILITY XL STRL (DRAPES) ×2 IMPLANT
DRSG TEGADERM 4X4.75 (GAUZE/BANDAGES/DRESSINGS) IMPLANT
ELECT BLADE 4.0 EZ CLEAN MEGAD (MISCELLANEOUS)
ELECT REM PT RETURN 9FT ADLT (ELECTROSURGICAL) ×2
ELECTRODE BLDE 4.0 EZ CLN MEGD (MISCELLANEOUS) IMPLANT
ELECTRODE REM PT RTRN 9FT ADLT (ELECTROSURGICAL) ×2 IMPLANT
EVACUATOR SILICONE 100CC (DRAIN) ×2 IMPLANT
GAUZE PAD ABD 8X10 STRL (GAUZE/BANDAGES/DRESSINGS) ×2 IMPLANT
GAUZE SPONGE 4X4 12PLY STRL LF (GAUZE/BANDAGES/DRESSINGS) IMPLANT
GLOVE BIO SURGEON STRL SZ 6.5 (GLOVE) IMPLANT
GLOVE BIO SURGEON STRL SZ7 (GLOVE) ×2 IMPLANT
GLOVE BIOGEL PI IND STRL 6.5 (GLOVE) IMPLANT
GLOVE BIOGEL PI IND STRL 7.5 (GLOVE) ×2 IMPLANT
GOWN STRL REUS W/ TWL LRG LVL3 (GOWN DISPOSABLE) ×6 IMPLANT
GOWN STRL REUS W/TWL LRG LVL3 (GOWN DISPOSABLE) ×6
HEMOSTAT ARISTA ABSORB 3G PWDR (HEMOSTASIS) IMPLANT
LIGHT WAVEGUIDE WIDE FLAT (MISCELLANEOUS) ×2 IMPLANT
NDL HYPO 25X1 1.5 SAFETY (NEEDLE) IMPLANT
NDL SAFETY ECLIP 18X1.5 (MISCELLANEOUS) IMPLANT
NEEDLE HYPO 25X1 1.5 SAFETY (NEEDLE) ×2 IMPLANT
NS IRRIG 1000ML POUR BTL (IV SOLUTION) ×2 IMPLANT
PACK BASIN DAY SURGERY FS (CUSTOM PROCEDURE TRAY) ×2 IMPLANT
PENCIL SMOKE EVACUATOR (MISCELLANEOUS) ×2 IMPLANT
PIN SAFETY STERILE (MISCELLANEOUS) ×2 IMPLANT
RETRACTOR ONETRAX LX 90X20 (MISCELLANEOUS) IMPLANT
SLEEVE SCD COMPRESS KNEE MED (STOCKING) ×2 IMPLANT
SPIKE FLUID TRANSFER (MISCELLANEOUS) IMPLANT
SPONGE T-LAP 18X18 ~~LOC~~+RFID (SPONGE) ×2 IMPLANT
SPONGE T-LAP 4X18 ~~LOC~~+RFID (SPONGE) IMPLANT
STAPLER VISISTAT 35W (STAPLE) IMPLANT
STRIP CLOSURE SKIN 1/2X4 (GAUZE/BANDAGES/DRESSINGS) IMPLANT
SUT ETHILON 2 0 FS 18 (SUTURE) ×2 IMPLANT
SUT MNCRL AB 4-0 PS2 18 (SUTURE) IMPLANT
SUT SILK 2 0 SH (SUTURE) IMPLANT
SUT VIC AB 2-0 SH 27 (SUTURE) ×2
SUT VIC AB 2-0 SH 27XBRD (SUTURE) ×4 IMPLANT
SUT VIC AB 3-0 54X BRD REEL (SUTURE) IMPLANT
SUT VIC AB 3-0 BRD 54 (SUTURE)
SUT VIC AB 3-0 SH 27 (SUTURE)
SUT VIC AB 3-0 SH 27X BRD (SUTURE) IMPLANT
SUT VICRYL 3-0 CR8 SH (SUTURE) ×2 IMPLANT
SYR CONTROL 10ML LL (SYRINGE) IMPLANT
TOWEL GREEN STERILE FF (TOWEL DISPOSABLE) ×4 IMPLANT
TRACER MAGTRACE VIAL (MISCELLANEOUS) IMPLANT
TUBE CONNECTING 20X1/4 (TUBING) ×2 IMPLANT
YANKAUER SUCT BULB TIP NO VENT (SUCTIONS) ×2 IMPLANT

## 2022-05-08 NOTE — Progress Notes (Signed)
Assisted Dr. Tobias Alexander with right, pectoralis ultrasound guided block. Side rails up, monitors on throughout procedure. See vital signs in flow sheet. Tolerated Procedure well.

## 2022-05-08 NOTE — Transfer of Care (Signed)
Immediate Anesthesia Transfer of Care Note  Patient: Brooke Marks  Procedure(s) Performed: RIGHT MASTECTOMY WITH AXILLARY SENTINEL LYMPH NODE BIOPSY (Right: Breast) RADIOACTIVE SEED GUIDED RIGHT AXILLARY SENTINEL LYMPH NODE EXCISION (Right)  Patient Location: PACU  Anesthesia Type:GA combined with regional for post-op pain  Level of Consciousness: drowsy and patient cooperative  Airway & Oxygen Therapy: Patient Spontanous Breathing and Patient connected to face mask oxygen  Post-op Assessment: Report given to RN and Post -op Vital signs reviewed and stable  Post vital signs: Reviewed and stable  Last Vitals:  Vitals Value Taken Time  BP 120/82 05/08/22 0907  Temp    Pulse 99 05/08/22 0908  Resp 17 05/08/22 0908  SpO2 100 % 05/08/22 0908  Vitals shown include unvalidated device data.  Last Pain:  Vitals:   05/08/22 0034  TempSrc: Oral  PainSc: 0-No pain      Patients Stated Pain Goal: 5 (91/79/15 0569)  Complications: No notable events documented.

## 2022-05-08 NOTE — Op Note (Signed)
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  Right total mastectomy 2.  Injection of mag trace for sentinel lymph node identification 3.  Right axillary seed guided axillary node excision 4.  Right axillary sentinel lymph node biopsy Surgeon: Dr. Serita Grammes Anesthesia: General with a pectoral block Estimated blood loss: 50 cc Specimens: 1.  Right breast tissue marked short superior, long lateral 2.  Right radioactive seed containing axillary lymph node confirmed by mammography 3.  Additional right axillary lymph nodes Complications: None Drains: 19 French Blake drain placed Sponge count was correct completion Disposition to recovery stable condition  Indications:41 year old female who is otherwise fairly healthy. She palpated a right breast mass. She has no discharge. She underwent mammogram and ultrasound. At the palpable site there was a suspicious mass measuring 1 cm. There are multiple small subcentimeter irregular masses in the upper outer and upper inner right breast. There are suspicious calcifications measuring 10 cm craniocaudal as well. The left breast is negative. She had a single enlarged right axillary lymph node. Ultrasound confirmed all of this as well. She has undergone biopsy of the upper outer right breast calcifications, upper inner right breast calcifications, the mass at 11:00 is well as an axillary lymph node. 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 These are ER and PR positive, HER2 negative, Ki-67 is 20%.since then she has had neg genetics, neg staging scans (although due a pelvic US to follow up), MRI that shows extensive involvement of the right breast and one abnormal ax node on right. Also had some cortical thickening on left that had biopsy and benign.  She elected  to proceed with mastectomy and targeted lymph node dissection.  She did not want to pursue reconstruction at the same time.  Procedure: After informed consent was obtained she first underwent a pectoral block.  Antibiotics were given.  SCDs were in place.  She was placed under general anesthesia without complication.  She was then prepped and draped in the standard sterile surgical fashion.  Surgical timeout was then performed.  I injected 2 cc of mag trace in the subareolar position and massages first.  I then made a large elliptical incision to encompass the nipple and the areola.  I carried flaps of the parasternal area, clavicle, latissimus and the inframammary fold inferiorly.  I obliterated the inframammary fold so that I can close this in a flat nature.  I then remove the breast as well as the fascia and passed this off the table as a specimen.  I then was able to identify the radioactive seed containing node.  The.  There were a couple others immediately adjacent to this.  I remove this.  Mammogram confirmed removal of the clip and the seed.  There were a number of other nodes that I was concerned about that I removed all of these as well.  She really has essentially a low axillary dissection at this point.  There is no other palpable tissue or abnormal tissue in her axilla.  I then obtained hemostasis.  I placed some Arista in the axilla.  I placed a 48 [place] Blake drain and secured this with 2-0 nylon suture.  I then closed this down to the chest wall with 3-0 Vicryl suture.  I then closed the skin with 4-0 Monocryl.  Glue Steri-Strips were applied.  What brought  her arm down flat and there was no extra tissue that was present and appeared this was going to be a flat closure.  She tolerated this well was extubated and transferred recovery stable.

## 2022-05-08 NOTE — H&P (Signed)
  41 year old female who is otherwise fairly healthy. She has a family history of breast cancer in a maternal first cousin in her 60s, maternal grandfather prostate cancer and uncle with gastric cancer. She has undergone a laparoscopic right colectomy in the past for what appeared to be endometriosis. She has a 56-year-old. She works in [employer] and lives in [place]. She palpated a right breast mass. She has no discharge. She underwent mammogram and ultrasound. She has B density breast. At the palpable site there was a suspicious mass measuring 1 cm. There are multiple small subcentimeter irregular masses in the upper outer and upper inner right breast. There are suspicious calcifications measuring 10 cm craniocaudal as well. The left breast is negative. She had a single enlarged right axillary lymph node. Ultrasound confirmed all of this as well. She is undergone several biopsies. She has undergone biopsy of the upper outer right breast calcifications, upper inner right breast calcifications, the mass at 11:00 is well as an axillary lymph node. 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 These are ER and PR positive, HER2 negative, Ki-67 is 20%.since then she has had neg genetics, neg staging scans (although due a pelvic US to follow up), MRI that shows extensive involvement of the right breast and one abnormal ax node on right. Also had some cortical thickening on left that had biopsy and benign. She is here to discuss surgery  Medical History: Past Medical History: Diagnosis Date Anxiety Cancer (CMS-HCC) GERD (gastroesophageal reflux disease) Hypertension  There is no problem list on file for this patient.  Past Surgical History: Procedure Laterality Date CESAREAN SECTION 01/27/2016 abdominal M-Judd APPENDECTOMY 03/2019 LAPAROSCOPIC COLON RESECTION Right 03/30/2019 s. gross SALPINGO  OOPHORECTOMY Right 03/30/2019  No Known Allergies  Current Outpatient Medications on File Prior to Visit Medication Sig Dispense Refill amLODIPine (NORVASC) 10 MG tablet Take 1 tablet by mouth once daily  Family History Problem Relation Age of Onset Stroke Mother High blood pressure (Hypertension) Mother Diabetes Mother High blood pressure (Hypertension) Father Hyperlipidemia (Elevated cholesterol) Father Diabetes Sister Diabetes Brother   Social History  Tobacco Use Smoking Status Never Smokeless Tobacco Never Marital status: Married Tobacco Use Smoking status: Never Smokeless tobacco: Never Substance and Sexual Activity Alcohol use: Not Currently Drug use: Never  Objective: Physical Exam Vitals reviewed. Constitutional: Appearance: Normal appearance. Chest: Breasts: Right: No inverted nipple, mass or nipple discharge. Lymphadenopathy: Upper Body: Right upper body: No supraclavicular or axillary adenopathy. Neurological: Mental Status: She is alert.   Assessment and Plan:  Right mastectomy, Right TAD  Will talk to plastics first but she would like to just move forward with surgery and due this delayed.  We discussed a sentinel lymph node biopsy/seed guided node excision (TAD) due to one involved node. Based on additional testing this may change. We discussed the performance of that with injection of tracer. Will send for sozo now also. We discussed risk lifetime of chronic shoulder pain as well as lymphedema associated with a sentinel lymph node biopsy.  We discussed mastectomy and the postoperative care for that as well. Mastectomy can be followed by reconstruction. We discussed incision type, drain placement, appearance, attempt to be flat postop, ability to use inserts as well.

## 2022-05-08 NOTE — Anesthesia Procedure Notes (Signed)
Procedure Name: LMA Insertion Date/Time: 05/08/2022 7:31 AM  Performed by: Glory Buff, CRNAPre-anesthesia Checklist: Patient identified, Emergency Drugs available, Suction available and Patient being monitored Patient Re-evaluated:Patient Re-evaluated prior to induction Oxygen Delivery Method: Circle system utilized Preoxygenation: Pre-oxygenation with 100% oxygen Induction Type: IV induction LMA: LMA inserted LMA Size: 4.0 Number of attempts: 1 Placement Confirmation: positive ETCO2 Tube secured with: Tape Dental Injury: Teeth and Oropharynx as per pre-operative assessment

## 2022-05-08 NOTE — Interval H&P Note (Signed)
History and Physical Interval Note:  05/08/2022 6:56 AM  Brooke Marks  has presented today for surgery, with the diagnosis of RIGHT BREAST CANCER.  The various methods of treatment have been discussed with the patient and family. After consideration of risks, benefits and other options for treatment, the patient has consented to  Procedure(s): RIGHT MASTECTOMY WITH AXILLARY SENTINEL LYMPH NODE BIOPSY (Right) RADIOACTIVE SEED GUIDED RIGHT AXILLARY SENTINEL LYMPH NODE EXCISION (Right) as a surgical intervention.  The patient's history has been reviewed, patient examined, no change in status, stable for surgery.  I have reviewed the patient's chart and labs.  Questions were answered to the patient's satisfaction.     Rolm Bookbinder

## 2022-05-08 NOTE — Anesthesia Procedure Notes (Signed)
Anesthesia Regional Block: Pectoralis block   Pre-Anesthetic Checklist: , timeout performed,  Correct Patient, Correct Site, Correct Laterality,  Correct Procedure, Correct Position, site marked,  Risks and benefits discussed,  Surgical consent,  Pre-op evaluation,  At surgeon's request and post-op pain management  Laterality: Right  Prep: chloraprep       Needles:  Injection technique: Single-shot  Needle Type: Echogenic Stimulator Needle     Needle Length: 10cm  Needle Gauge: 21     Additional Needles:   Procedures:,,,, ultrasound used (permanent image in chart),,    Narrative:  Start time: 05/08/2022 6:51 AM End time: 05/08/2022 7:01 AM Injection made incrementally with aspirations every 5 mL.  Performed by: Personally

## 2022-05-08 NOTE — Anesthesia Postprocedure Evaluation (Signed)
Anesthesia Post Note  Patient: Brooke Marks  Procedure(s) Performed: RIGHT MASTECTOMY WITH AXILLARY SENTINEL LYMPH NODE BIOPSY (Right: Breast) RADIOACTIVE SEED GUIDED RIGHT AXILLARY SENTINEL LYMPH NODE EXCISION (Right)     Patient location during evaluation: PACU Anesthesia Type: General Level of consciousness: sedated Pain management: pain level controlled Vital Signs Assessment: post-procedure vital signs reviewed and stable Respiratory status: spontaneous breathing and respiratory function stable Cardiovascular status: stable Postop Assessment: no apparent nausea or vomiting Anesthetic complications: no  No notable events documented.  Last Vitals:  Vitals:   05/08/22 0945 05/08/22 1000  BP: 131/87 136/81  Pulse: 98 97  Resp: 10 12  Temp:    SpO2: 98% 97%    Last Pain:  Vitals:   05/08/22 0955  TempSrc:   PainSc: [place]

## 2022-05-08 NOTE — Discharge Instructions (Signed)
[facility] [phone]  MASTECTOMY: POST OP INSTRUCTIONS Take 400 mg of ibuprofen every 8 hours or 650 mg tylenol every 6 hours for next 72 hours then as needed. Use ice several times daily also. Always review your discharge instruction sheet given to you by the facility where your surgery was performed.  A prescription for pain medication may be given to you upon discharge.  Take your pain medication as prescribed, if needed.  If narcotic pain medicine is not needed, then you may take acetaminophen (Tylenol), naprosyn (Alleve) or ibuprofen (Advil) as needed. Take your usually prescribed medications unless otherwise directed. If you need a refill on your pain medication, please contact your pharmacy.  They will contact our office to request authorization.  Prescriptions will not be filled after 5pm or on week-ends. You should follow a light diet the first 24 hours after surgery.  Resume your normal diet the day after surgery. Most patients will experience some swelling and bruising on the chest and underarm.  Ice packs will help.  Swelling and bruising can take several days to resolve. Wear the binder or [place] bra for 72 hours day and night. After night please wear during the day.  It is common to experience some constipation if taking pain medication after surgery.  Increasing fluid intake and taking a stool softener (such as Colace) will usually help or prevent this problem from occurring.  A mild laxative (Milk of Magnesia or Miralax) should be taken according to package instructions if there are no bowel movements after 48 hours. There is glue and steristrips on your incision. They will come off in the next few weeks.  You may take a shower 48 hours after surgery.  Any sutures will be removed at an office visit DRAINS:  If you have drains in place, it is important to keep a list of the amount of drainage produced each day in your drains.  Before leaving the hospital, you  should be instructed on drain care.  Call our office if you have any questions about your drains. I will remove your drains when they put out less than 30 cc or ml for 2 consecutive days. ACTIVITIES:  You may resume regular (light) daily activities beginning the next day--such as daily self-care, walking, climbing stairs--gradually increasing activities as tolerated.  You may have sexual intercourse when it is comfortable.  Refrain from any heavy lifting or straining until approved by your doctor. You may drive when you are no longer taking prescription pain medication, you can comfortably wear a seatbelt, and you can safely maneuver your car and apply brakes. RETURN TO WORK:  __________________________________________________________ You should see your doctor in the office for a follow-up appointment approximately 3-5 days after your surgery.  Your doctor's nurse will typically make your follow-up appointment when she calls you with your pathology report.  Expect your pathology report 3-4business days after surgery. OTHER INSTRUCTIONS: ______________________________________________________________________________________________ ____________________________________________________________________________________________ WHEN TO CALL YOUR DR Schuyler Behan: Fever over 101.0 Nausea and/or vomiting Extreme swelling or bruising Continued bleeding from incision. Increased pain, redness, or drainage from the incision. The clinic staff is available to answer your questions during regular business hours.  Please don't hesitate to call and ask to speak to one of the nurses for clinical concerns.  If you have a medical emergency, go to the nearest emergency room or call 911.  A surgeon from [facility] is always on call at the hospital. [address], [address], [place], [place]    [address] ? [address] [address], [place], [place]   [address] [phone] ? [phone] ? FAX [phone] Web site:  [email or web address]    About my Jackson-Pratt Bulb Drain  What is a Jackson-Pratt bulb? A Jackson-Pratt is a soft, round device used to collect drainage. It is connected to a long, thin drainage catheter, which is held in place by one or two small stiches near your surgical incision site. When the bulb is squeezed, it forms a vacuum, forcing the drainage to empty into the bulb.  Emptying the Jackson-Pratt bulb- To empty the bulb: 1. Release the plug on the top of the bulb. 2. Pour the bulb's contents into a measuring container which your nurse will provide. 3. Record the time emptied and amount of drainage. Empty the drain(s) as often as your     doctor or nurse recommends.  Date                  Time                    Amount (Drain 1)                 Amount (Drain 2)  _____________________________________________________________________  _____________________________________________________________________  _____________________________________________________________________  _____________________________________________________________________  _____________________________________________________________________  _____________________________________________________________________  _____________________________________________________________________  _____________________________________________________________________  Squeezing the Jackson-Pratt Bulb- To squeeze the bulb: 1. Make sure the plug at the top of the bulb is open. 2. Squeeze the bulb tightly in your fist. You will hear air squeezing from the bulb. 3. Replace the plug while the bulb is squeezed. 4. Use a safety pin to attach the bulb to your clothing. This will keep the catheter from     pulling at the bulb insertion site.  When to call your doctor- Call your doctor if: Drain site becomes red, swollen or hot. You have a fever greater than 101 degrees F. There is oozing at the drain  site. Drain falls out (apply a guaze bandage over the drain hole and secure it with tape). Drainage increases daily not related to activity patterns. (You will usually have more drainage when you are active than when you are resting.) Drainage has a bad odor.   

## 2022-05-09 MED ORDER — TRAMADOL HCL 50 MG PO TABS: 50.0000 mg | ORAL_TABLET | Freq: Four times a day (QID) | ORAL | 0 refills | Status: DC | PRN

## 2022-05-09 NOTE — Discharge Summary (Signed)
Physician Discharge Summary  Patient ID: Brooke Marks MRN: [id number] DOB/AGE: Sep 11, 1980 41 y.o.  Admit date: 05/08/2022 Discharge date: 05/09/2022  Admission Diagnoses: Breast cancer, right  Discharge Diagnoses:  Principal Problem:   S/P mastectomy, right   Discharged Condition: good  Hospital Course: 81 yof underwent right mastectomy/TAD. Doing well following am and will be discharged home.   Consults: None  Significant Diagnostic Studies: none  Treatments: surgery: right mastectomy, TAD  Discharge Exam: Blood pressure 129/86, pulse 67, temperature 98.6 F (37 C), resp. rate 18, height '5\' 2"'$  (1.575 m), weight 80.8 kg, last menstrual period 05/06/2022, SpO2 100 %, unknown if currently breastfeeding. Drain serosang, no hematoma  Disposition:    Allergies as of 05/09/2022   No Known Allergies      Medication List     TAKE these medications    amLODipine 10 MG tablet Commonly known as: NORVASC TAKE 1 TABLET BY MOUTH EVERY DAY   multivitamin tablet Take 1 tablet by mouth daily.   traMADol 50 MG tablet Commonly known as: ULTRAM Take 1 tablet (50 mg total) by mouth every 6 (six) hours as needed.        Follow-up Information     Rolm Bookbinder, MD Follow up in 3 week(s).   Specialty: General Surgery Contact information: [address] [place] [address] [phone]                 Signed: Rolm Bookbinder 05/09/2022, 7:29 AM

## 2022-05-10 ENCOUNTER — Encounter (HOSPITAL_BASED_OUTPATIENT_CLINIC_OR_DEPARTMENT_OTHER): Payer: Self-pay | Admitting: General Surgery

## 2022-05-10 LAB — SURGICAL PATHOLOGY

## 2022-05-17 ENCOUNTER — Encounter: Payer: Self-pay | Admitting: *Deleted

## 2022-05-20 NOTE — Progress Notes (Incomplete)
Patient Care Team: [name], DO as PCP - General (Family Medicine) Kathrene Alu, MD (Inactive) (Family Medicine) Nicholas Lose, MD as Consulting Physician (Hematology and Oncology) Rolm Bookbinder, MD as Consulting Physician (General Surgery) Mauro Kaufmann, RN as Oncology Nurse Navigator Rockwell Germany, RN as Oncology Nurse Navigator  DIAGNOSIS: No diagnosis found.  SUMMARY OF ONCOLOGIC HISTORY: Oncology History  Malignant neoplasm of overlapping 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, estrogen receptor positive ([place])  03/08/2022 Initial Diagnosis   Palpable lump in the right breast, mammogram and ultrasound revealed 0.8 cm and 0.8 cm and 0.5 cm masses, 10 cm suspicious calcifications, single enlarged right axillary lymph node 11:00: Grade 2 IDC with IG DCIS ER 70%, PR 90%, Ki-67 20%, HER2 1+ negative Lymph node: Positive UIQ and UOQ right breast: Grade 2 IDC with IG DCIS   03/17/2022 Cancer Staging   Staging form: Breast, AJCC 8th Edition - Clinical stage from 03/17/2022: Stage IB (cT1b, cN1, cM0, G2, ER+, PR+, HER2-) - Signed by Nicholas Lose, MD on 03/17/2022 Stage prefix: Initial diagnosis Histologic grading system: 3 grade system   03/30/2022 Genetic Testing   Negative [facility] CustomNext-Cancer +RNAinsight Panel.  Report date is 03/30/2022.   The CustomNext-Cancer+RNAinsight panel offered by [facility] includes sequencing and rearrangement analysis for the following 37 genes:  APC, ATM, BARD1, BMPR1A, BRCA1, BRCA2, BRIP1, CDH1, CDK4, CDKN2A, CHEK2, DICER1, MLH1, MSH2, MSH6, MUTYH, NBN, NF1, NTHL1, PALB2, PMS2, PTEN, RAD51C, RAD51D, RECQL, SMAD4, SMARCA4, STK11 and TP53 (sequencing and deletion/duplication); AXIN2, CTNNA1, HOXB13, MSH3, POLD1 and POLE (sequencing only); EPCAM and GREM1 (deletion/duplication only). RNA data is routinely analyzed for use in variant interpretation for all genes     CHIEF COMPLIANT: Follow-up after surgery  INTERVAL HISTORY: Brooke  Dalaya Marks is a 41 y.o. female is here because of recent diagnosis of right breast cancer. She presents to the clinic for a follow-up.    ALLERGIES:  has No Known Allergies.  MEDICATIONS:  Current Outpatient Medications  Medication Sig Dispense Refill   amLODipine (NORVASC) 10 MG tablet TAKE 1 TABLET BY MOUTH EVERY DAY 90 tablet 3   Multiple Vitamin (MULTIVITAMIN) tablet Take 1 tablet by mouth daily.     traMADol (ULTRAM) 50 MG tablet Take 1 tablet (50 mg total) by mouth every 6 (six) hours as needed. 10 tablet 0   No current facility-administered medications for this visit.    PHYSICAL EXAMINATION: ECOG PERFORMANCE STATUS: {CHL ONC ECOG PS:[phone]}  There were no vitals filed for this visit. There were no vitals filed for this visit.  BREAST:*** No palpable masses or nodules in either right or left breasts. No palpable axillary supraclavicular or infraclavicular adenopathy no breast tenderness or nipple discharge. (exam performed in the presence of a chaperone)  LABORATORY DATA:  I have reviewed the data as listed    Latest Ref Rng & Units 03/30/2022    3:05 PM 04/09/2019   12:30 PM 04/04/2019    1:15 PM  CMP  Glucose 65 - 99 mg/dL  93  107   BUN 6 - 20 mg/dL  12  14   Creatinine 0.44 - 1.00 mg/dL 0.50  0.59  0.55   Sodium 134 - 144 mmol/L  141  138   Potassium 3.5 - 5.2 mmol/L  3.9  3.5   Chloride 96 - 106 mmol/L  104  103   CO2 20 - 29 mmol/L  26  23   Calcium 8.7 - 10.2 mg/dL  9.1  8.8  Total Protein 6.5 - 8.1 g/dL   8.1   Total Bilirubin 0.3 - 1.2 mg/dL   0.6   Alkaline Phos 38 - 126 U/L   52   AST 15 - 41 U/L   34   ALT 0 - 44 U/L   26     Lab Results  Component Value Date   WBC 6.9 04/09/2019   HGB 10.1 (L) 04/09/2019   HCT 30.9 (L) 04/09/2019   MCV 77 (L) 04/09/2019   PLT 445 04/09/2019   NEUTROABS 4.1 01/14/2014    ASSESSMENT & PLAN:  No problem-specific Assessment & Plan notes found for this encounter.    No orders of the defined types were  placed in this encounter.  The patient has a good understanding of the overall plan. she agrees with it. she will call with any problems that may develop before the next visit here. Total time spent: 30 mins including face to face time and time spent for planning, charting and co-ordination of care   Suzzette Righter, [place] 05/20/22    I Gardiner Coins am acting as a Education administrator for [employer]  ***

## 2022-05-23 ENCOUNTER — Telehealth: Payer: Self-pay | Admitting: *Deleted

## 2022-05-23 ENCOUNTER — Encounter: Payer: Self-pay | Admitting: *Deleted

## 2022-05-23 NOTE — Telephone Encounter (Signed)
Received order for Mammaprint testing. Requisition sent to pathology

## 2022-05-24 ENCOUNTER — Telehealth: Payer: Self-pay

## 2022-05-24 ENCOUNTER — Inpatient Hospital Stay
Payer: Federal, State, Local not specified - PPO | Attending: Hematology and Oncology | Admitting: Hematology and Oncology

## 2022-05-24 VITALS — BP 138/81 | HR 90 | Temp 98.1°F | Resp 18 | Ht 62.0 in | Wt 177.4 lb

## 2022-05-24 DIAGNOSIS — C50811 Malignant neoplasm of overlapping sites of right female breast: Secondary | ICD-10-CM

## 2022-05-24 DIAGNOSIS — Z170 Estrogen receptor positive status [ER+]: Secondary | ICD-10-CM

## 2022-05-24 DIAGNOSIS — Z79899 Other long term (current) drug therapy: Secondary | ICD-10-CM | POA: Insufficient documentation

## 2022-05-24 NOTE — Assessment & Plan Note (Signed)
05/08/2022:Right lumpectomy: Grade 2 IDC 8 cm with intermediate grade DCIS, margins negative, ER 70%, PR 90%, HER2 negative 1+, 1/4 lymph nodes positive with focal extracapsular extension  Pathology counseling: I discussed the final pathology report of the patient provided  a copy of this report. I discussed the margins as well as lymph node surgeries. We also discussed the final staging along with previously performed ER/PR and HER-2/neu testing.  Treatment plan: MammaPrint testing to determine if chemotherapy would be of any benefit followed by Adjuvant radiation therapy followed by Adjuvant antiestrogen therapy Genetic testing: Negative  Return to clinic based upon MammaPrint test result

## 2022-05-24 NOTE — Telephone Encounter (Signed)
[name], counseling intern, called the patient to schedule an appointment. The patient was driving and not in front of their calendar.   The counselor will call the patient again tomorrow.   [name],  Counseling Intern  [phone] [email or web address]

## 2022-05-25 ENCOUNTER — Telehealth: Payer: Self-pay

## 2022-05-25 NOTE — Telephone Encounter (Signed)
Brooke Marks, counseling intern, called patient to schedule a counseling session.   Patient scheduled their counseling session for Thursday, 2/1 at [place],  Counseling Intern  [phone] [email or web address]

## 2022-05-31 ENCOUNTER — Encounter: Payer: Self-pay | Admitting: Student

## 2022-06-01 ENCOUNTER — Telehealth: Payer: Self-pay | Admitting: *Deleted

## 2022-06-01 ENCOUNTER — Encounter: Payer: Self-pay | Admitting: *Deleted

## 2022-06-01 ENCOUNTER — Other Ambulatory Visit: Payer: Self-pay | Admitting: Student

## 2022-06-01 DIAGNOSIS — Z170 Estrogen receptor positive status [ER+]: Secondary | ICD-10-CM

## 2022-06-01 DIAGNOSIS — N6320 Unspecified lump in the left breast, unspecified quadrant: Secondary | ICD-10-CM

## 2022-06-01 NOTE — Telephone Encounter (Signed)
Received mammaprint results of low risk. Patient is aware. Referral placed for xrt.

## 2022-06-02 ENCOUNTER — Telehealth: Payer: Self-pay | Admitting: Radiation Oncology

## 2022-06-02 ENCOUNTER — Other Ambulatory Visit: Payer: Self-pay | Admitting: *Deleted

## 2022-06-02 DIAGNOSIS — Z170 Estrogen receptor positive status [ER+]: Secondary | ICD-10-CM

## 2022-06-02 NOTE — Therapy (Signed)
OUTPATIENT PHYSICAL THERAPY BREAST CANCER POST OP FOLLOW UP   Patient Name: Brooke Marks MRN: [id number] DOB:04-27-1981, 41 y.o., female Today's Date: 06/05/2022  END OF SESSION:  PT End of Session - 06/05/22 0852     Visit Number 2    Number of Visits 10    Date for PT Re-Evaluation 07/03/22    PT Start Time 0803    PT Stop Time 6812    PT Time Calculation (min) 51 min    Activity Tolerance Patient tolerated treatment well    Behavior During Therapy [facility] for tasks assessed/performed             Past Medical History:  Diagnosis Date   Anxiety    Cancer ([place])    breast   Chlamydia    Depression    Gestational diabetes mellitus 12/17/2018   Hypertension    Panic attack with specific phobia 04/11/2010   Panic attacks specifically with telephone ring. Rarely uses her cell phone for calls. Usually communicates via text. Affecting her job. Works with [employer]. 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 Concerned that her employer is planning to return her previous position. Denies agoraphobia.      PIH (pregnancy induced hypertension) 01/27/2016   PONV (postoperative nausea and vomiting)    S/P cesarean section 01/27/2016   Past Surgical History:  Procedure Laterality Date   BREAST BIOPSY  05/05/2022   [place] RT RADIOACTIVE SEED LOC 05/05/2022 [facility] MAMMOGRAPHY   CESAREAN SECTION     CESAREAN SECTION N/A 01/27/2016   Procedure: CESAREAN SECTION;  Surgeon: Janyth Contes, MD;  Location: [place];  Service: Obstetrics;  Laterality: N/A;  MD requests RNFA   COLON RESECTION N/A 03/30/2019   Procedure: LAPAROSCOPIC RIGHT COLECTOMY WITH RIGHT SALPINGO OOPHORECTOMY TAP BLOCK;  Surgeon: Michael Boston, MD;  Location: [facility];  Service: General;  Laterality: N/A;   LAPAROSCOPIC LYSIS OF ADHESIONS N/A 03/30/2019   Procedure: LAPAROSCOPIC LYSIS OF ADHESIONS;  Surgeon: Michael Boston, MD;  Location: [facility];  Service: General;  Laterality: N/A;    MASTECTOMY W/ SENTINEL NODE BIOPSY Right 05/08/2022   Procedure: RIGHT MASTECTOMY WITH AXILLARY SENTINEL LYMPH NODE BIOPSY;  Surgeon: Rolm Bookbinder, MD;  Location: [place];  Service: General;  Laterality: Right;   RADIOACTIVE SEED GUIDED AXILLARY SENTINEL LYMPH NODE Right 05/08/2022   Procedure: RADIOACTIVE SEED GUIDED RIGHT AXILLARY SENTINEL LYMPH NODE EXCISION;  Surgeon: Rolm Bookbinder, MD;  Location: [place];  Service: General;  Laterality: Right;   [place] EXTRACTION     Patient Active Problem List   Diagnosis Date Noted   S/P mastectomy, right 05/08/2022   Hyperlipidemia 04/11/2022   Genetic testing 03/28/2022   Malignant neoplasm of overlapping 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, estrogen receptor positive ([place]) 03/14/2022   Prediabetes 03/14/2022   Invasive ductal carcinoma of breast, stage 2, right ([place]) 02/14/2022   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/30/2019 03/30/2019   Hypertension 01/27/2016   S/P cesarean section 01/27/2016   Anxiety state 04/13/2015   CARPAL TUNNEL SYNDROME, LEFT 04/11/2010   HERPES SIMPLEX INFECTION, RECURRENT 02/26/2009   Obesity (BMI 30.0-34.9) 07/19/2006    PCP: [name], DO   REFERRING PROVIDER: Rolm Bookbinder, MD  REFERRING DIAG: [phone] (ICD-10-CM) - Malignant neoplasm of unspecified site of right female breast C77.3 (ICD-10-CM) - Secondary and unspecified malignant neoplasm of axilla and upper limb lymph nodes    THERAPY DIAG:  Stiffness of right shoulder, not  elsewhere classified  Aftercare following surgery for neoplasm  Localized edema  Abnormal posture  Malignant neoplasm of right female breast, unspecified estrogen receptor status, unspecified site of breast ([place])  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 I stretch multiple times throughout the day.   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 It is ER/PR +, HER2-. Ki67 20%. It is located in different locations in the R breast.  05/08/22 R mastectomy and SLNB 1/4  PATIENT GOALS:  Reassess how my recovery is going related to arm function, pain, and swelling.  PAIN:  Are you having pain? No  PRECAUTIONS: Recent Surgery, right UE Lymphedema risk,   ACTIVITY LEVEL / LEISURE: pt has been exercising, running and walking   OBJECTIVE:   PATIENT SURVEYS:  QUICK DASH:  Quick Dash - 06/05/22 0001     Open a tight or new jar No difficulty    Do heavy household chores (wash walls, wash floors) No difficulty    Carry a shopping bag or briefcase No difficulty    Wash your back No difficulty    Use a knife to cut food No difficulty    Recreational activities in which you take some force or impact through your arm, shoulder, or hand (golf, hammering, tennis) Mild difficulty    During the past week, to what extent has your arm, shoulder or hand problem interfered with your normal social activities with family, friends, neighbors, or groups? Not at all    During the past week, to what extent has your arm, shoulder or hand problem limited your work or other regular daily activities Not at all    Arm, shoulder, or hand pain. Mild    Tingling (pins and needles) in your arm, shoulder, or hand None    Difficulty Sleeping No difficulty    DASH Score 4.55 %              OBSERVATIONS: Healing mastectomy scar with steristrips in place, still open on lateral edge where skin is folded, fullness superior to scar at lateral edge, multiple cords present in R axilla  POSTURE:   Forward head and rounded shoulders posture   UPPER  EXTREMITY AROM/PROM:   A/PROM RIGHT   eval   RIGHT 06/05/22  Shoulder extension 85 80  Shoulder flexion 169 156  Shoulder abduction 167 152  Shoulder internal rotation 43 60  Shoulder external rotation 80 79                          (Blank rows = not tested)   A/PROM LEFT   eval  Shoulder extension 85  Shoulder flexion 160  Shoulder abduction 172  Shoulder internal rotation 51  Shoulder external rotation 93                          (Blank rows = not tested)     CERVICAL AROM: All within normal limits:      Percent limited  Flexion WFL  Extension WFL  Right lateral flexion WFL  Left lateral flexion WFL  Right rotation WFL  Left rotation WFL      UPPER EXTREMITY STRENGTH: 5/5   LYMPHEDEMA ASSESSMENTS:    LANDMARK RIGHT   eval RIGHT 06/05/22  10 cm proximal to olecranon process 33.5 33.4  Olecranon process 27.5 27.5  10 cm proximal to ulnar styloid process 23.9 23.4  Just proximal to ulnar styloid process 16.5 16  Across hand at thumb web space 19.5 19.5  At base of 2nd digit 6.2 6  (Blank rows = not tested)   LANDMARK LEFT   eval  10 cm proximal to olecranon process 33  Olecranon process 27  10 cm proximal to ulnar styloid process 23  Just proximal to ulnar styloid process 16.3  Across hand at thumb web space 19.9  At base of 2nd digit 6.2  (Blank rows = not tested)  Surgery type/Date: 05/08/22 R mastectomy and SLNB Number of lymph nodes removed: 1/4 Current/past treatment (chemo, radiation, hormone therapy): will begin radiation soon Other symptoms:  Heaviness/tightness Yes Pain No Pitting edema No Infections No Decreased scar mobility Yes Stemmer sign No  PATIENT EDUCATION:  Education details: axillary cording, importance of stretching, ABC class- pt is attending today, importance of exercise to reduce risk of recurrence Person educated: Patient Education method: Explanation Education  comprehension: verbalized understanding  TREATMENT PERFORMED: 06/05/22: PROM to R shoulder in direction of flexion and abduction  MFR to cording in R axilla with pt reporting decreased tightness and discomfort with ROM following this  HOME EXERCISE PROGRAM: Reviewed previously given post op HEP.   ASSESSMENT:  CLINICAL IMPRESSION: Pt returns to PT after undergoing a R mastectomy and SLNB (1/4). She will be undergoing radiation soon. She has limited R shoulder ROM compared to baseline. She does have cording in her R axilla and after some manual therapy today did demonstrate improved comfort and ROM. She also has some edema superior to lateral edge of her mastectomy scar. She would benefit from skilled PT services to improve R shoulder ROM and decrease edema superior to her scar.   Pt will benefit from skilled therapeutic intervention to improve on the following deficits: Decreased knowledge of precautions, impaired UE functional use, pain, decreased ROM, postural dysfunction.   PT treatment/interventions: ADL/Self care home management, Therapeutic exercises, Therapeutic activity, Patient/Family education, Self Care, Joint mobilization, Orthotic/Fit training, Manual lymph drainage, Compression bandaging, scar mobilization, Taping, Manual therapy, and Re-evaluation   GOALS: Goals reviewed with patient? Yes  LONG TERM GOALS:  (STG=LTG)  GOALS Name Target Date  Goal status  1 Pt will demonstrate she  has regained full shoulder ROM and function post operatively compared to baselines.  Baseline: 07/03/22 INITIAL  2 Pt will demonstrate 169 degrees of R shoulder flexion to allow her to reach overhead. 07/03/22 INITIAL  3 Pt will demonstrate 167 degrees of R shoulder abduction to allow her to reach out to the side.  07/03/22 INITIAL  4 Pt will not demonstrate any cording at end range to allow improved comfort. 07/03/22 INITIAL  5 Pt will be independent in a home exercise program for continued  strengthening and stretching.  07/03/22 INITIAL     PLAN:  PT FREQUENCY/DURATION: 2x/wk for 4 wks  PLAN FOR NEXT SESSION: PROM to R shoulder, pulleys, ball, eventually supine scap, MLD to area of edema superior to lateral mastectomy scar   [facility]  [address]  [place] [address]  [phone]  After Breast Cancer Class It is recommended you attend the ABC class to be educated on lymphedema risk reduction. This class is free of charge and lasts for 1 hour. It is a 1-time class. You will need to download the Webex app either on your phone or computer. We will send you a link the night before or the morning of the class. You should be able to click on that link to join the class. This is not a confidential class. You don't have to turn your camera on, but other participants may be able to see your email address.  Scar massage You can begin gentle scar massage to you incision sites. Gently place one hand on the incision and move the skin (without sliding on the skin) in various directions. Do this for a few minutes and then you can gently massage either coconut oil or vitamin E cream into the scars.  Compression garment You should continue wearing your compression bra until you feel like you no longer have swelling.  Home exercise Program Continue doing the exercises you were given until you feel like you can do them without feeling any tightness at the end.   Walking Program Studies show that 30 minutes of walking per day (fast enough to elevate your heart rate) can significantly reduce the risk of a cancer recurrence. If you can't walk due to other medical reasons, we encourage you to find another activity you could do (like a stationary bike or water exercise).  Posture After breast cancer surgery, people frequently sit with rounded shoulders posture because it puts their incisions on slack and feels better. If you sit like this and scar tissue  forms in that position, you can become very tight and have pain sitting or standing with good posture. Try to be aware of your posture and sit and stand up tall to heal properly.  Follow up PT: It is recommended you return every 3 months for the first 3 years following surgery to be assessed on the SOZO machine for an L-Dex score. This helps prevent clinically significant lymphedema in 95% of patients. These follow up screens are 10 minute appointments that you are not billed for.  [employer], PT 06/05/2022, 9:05 AM

## 2022-06-02 NOTE — Telephone Encounter (Signed)
LVM to schedule consult with Dr. Squire ?

## 2022-06-02 NOTE — Progress Notes (Signed)
Location of Breast Cancer: Malignant neoplasm of overlapping 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, estrogen receptor positive   Histology per Pathology Report:  FINAL MICROSCOPIC DIAGNOSIS:  A. BREAST, RIGHT, MASTECTOMY: Invasive ductal carcinoma, grade 2, 8 cm (see comment) Ductal carcinoma in situ: Present, intermediate nuclear grade with necrosis and calcifications Margins, invasive: Negative for invasive carcinoma     Closest, invasive: 25 mm posterior Margins, DCIS: Negative for DCIS     Closest, DCIS: 25 mm posterior Lymphovascular invasion: Present Prognostic markers:  ER 70%, positive, PR 90%, positive, Her2 negative (1+) Other: Three biopsy sites and three biopsy clips See oncology table and comment  B. LYMPH NODE, RIGHT AXILLARY, SENTINEL, EXCISION: Metastatic carcinoma in 1 lymph node (1/1) Metastasis is 2.2 cm Focal extracapsular extension Biopsy site changes  C. LYMPH NODE, RIGHT AXILLARY, SENTINEL, EXCISION: One lymph node negative for metastatic carcinoma (0/1)  D. LYMPH NODE, RIGHT AXILLARY, SENTINEL, EXCISION: One lymph node negative for metastatic carcinoma (0/1)  E. LYMPH NODE, RIGHT AXILLARY, SENTINEL, EXCISION: One lymph node negative for metastatic carcinoma (0/1)   ONCOLOGY TABLE: INVASIVE CARCINOMA OF THE BREAST:  Resection Procedure: Right mastectomy and four sentinel lymph nodes Specimen Laterality: Right breast Histologic Type: Ductal Histologic Grade:      Glandular (Acinar)/Tubular Differentiation: 2      Nuclear Pleomorphism: 2      Mitotic Rate: 2      Overall Grade: 2 Tumor Size: 8 cm, see comment Ductal Carcinoma In Situ: Present, intermediate nuclear grade with necrosis Treatment Effect in the Breast: No known presurgical therapy Margins: All margins negative for invasive carcinoma      Distance from Closest Margin (mm): 25      Specify Closest Margin (required only if <61m): Posterior DCIS Margins: All margins negative for DCIS       Distance from Closest Margin (mm): 25      Specify Closest Margin (required only if <144m: Posterior Regional Lymph Nodes:      Number of Lymph Nodes Examined: 4      Number of Sentinel Nodes Examined: 4      Number of Lymph Nodes with Macrometastases (>2 mm): 1      Number of Lymph Nodes with Micrometastases: 0      Number of Lymph Nodes with Isolated Tumor Cells (=0.2 mm or =200 cells): 0      Size of Largest Metastatic Deposit (mm): 22      Extranodal Extension: Present, focal Distant Metastasis:      Distant Site(s) Involved: Not applicable Breast Biomarker Testing Performed on Previous Biopsy:      Testing Performed on Case Number: [id number]            Estrogen Receptor: 70%, positive, moderate staining            Progesterone Receptor: 90%, positive, strong staining            HER2: Negative with IHC (1+)            Ki-67: 20% Pathologic Stage Classification (pTNM, AJCC 8th Edition): pT3, pN1a Representative Tumor Block: A3-A7 Comment(s): There is invasive and in situ ductal carcinoma at each of the three biopsy sites and sections between the biopsy sites also show invasive and in situ carcinoma and therefore the tumor is staged as a contiguous mass, estimated at 8 cm in greatest dimension. (v4.5.0.0)  GROSS DESCRIPTION: A: Specimen: Right simple mastectomy, received fresh.  The specimen is placed in formalin at 8:55 AM on 05/08/2022. Specimen integrity (  intact/disrupted): Intact Weight: 910 g Size: 21 x 20 x 5 cm Skin: There is an attached 20 x 15 cm portion of skin including an unremarkable nipple.  The specimen is oriented with a long suture at lateral and short suture at superior. Tumor/cavity: There is a 1.2 x 1.2 x 1 cm indurated white stellate mass in the upper outer quadrant (11:00) with a ribbon biopsy clip.  The mass is located 2.5 cm from the deep margin.  There are 2 additional biopsy clips present, an X clip in the upper inner quadrant and a coil clip  in the upper outer quadrant.  There are no masses identified at the clips. The X and coil clip are approximately 6 cm apart.  The coil clip is approximately 2 cm lateral to the ribbon clip mass. Uninvolved parenchyma: The remainder of the breast tissue consist of soft yellow adipose tissue and focally dense white fibrous tissue. Prognostic indicators: Obtained from paraffin blocks if needed. Lymph nodes: There are no lymph nodes identified. Block summary: 15 blocks submitted 1 = nipple 2 = deep margin at ribbon mass (11:00) 3-5 = ribbon clip mass 6, 7 = sections at X clip 8, 9 = sections at coil clip 10, 11 = sections between X clip and coil clip 12 = upper outer 13 = upper inner 14 = lower outer 15 = lower inner  B: Received fresh is a 2.2 x 1.5 x 1 cm seed localized lymph node excision.  The localization seed is identified at the time of receipt. The cut surface is solid, tan-white to hemorrhagic.  The lymph node is sectioned and entirely submitted in 3 cassettes.  C: Received fresh is a 0.6 x 0.5 x 0.5 cm rubbery tan-yellow nodule. The specimen is bisected and entirely submitted in 1 cassette.  D: Received fresh is a 1.2 x 1 x 0.8 cm rubbery tan-yellow nodule.  The specimen is sectioned and entirely submitted in 1 cassette.  E: Received fresh is a 1.6 x 1 x 0.8 cm rubbery tan-yellow nodule.  The specimen is sectioned and entirely submitted in 1 cassette.  [facility] 05/09/2022)  Final Diagnosis performed by Claudette Laws, MD.   Electronically signed 05/10/2022   Receptor Status: Estrogen Receptor: 70%, positive, moderate staining             Progesterone Receptor: 90%, positive, strong staining             HER2: Negative with IHC (1+)             Ki-67: 20%   Did patient present with symptoms (if so, please note symptoms) or was this found on screening mammography?: palpated mass   Past/Anticipated interventions by surgeon, if any: Rolm Bookbinder, MD   Physician Surgery   Op Note    Signed   Date of Service: 05/08/2022  9:04 AM   Signed      Preoperative diagnosis: Multifocal right breast cancer with positive lymph node Postoperative diagnosis: Same as above Procedure: 1.  Right total mastectomy 2.  Injection of mag trace for sentinel lymph node identification 3.  Right axillary seed guided axillary node excision 4.  Right axillary sentinel lymph node biopsy Surgeon: Dr. Serita Grammes          Past/Anticipated interventions by medical oncology, if any: ASSESSMENT & PLAN:  Malignant neoplasm of overlapping 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, estrogen receptor positive ([place]) 05/08/2022:Right lumpectomy: Grade 2 IDC 8 cm with intermediate grade DCIS, margins negative, ER 70%, PR  90%, HER2 negative 1+, 1/4 lymph nodes positive with focal extracapsular extension   Pathology counseling: I discussed the final pathology report of the patient provided  a copy of this report. I discussed the margins as well as lymph node surgeries. We also discussed the final staging along with previously performed ER/PR and HER-2/neu testing.   Treatment plan: MammaPrint testing to determine if chemotherapy would be of any benefit followed by Adjuvant radiation therapy followed by Adjuvant antiestrogen therapy Genetic testing: Negative   Return to clinic based upon MammaPrint test result  Harriette Ohara, MD 05/24/22   Lymphedema issues, if any:  {:18581} {t:21944}   Pain issues, if any:  {:18581} {PAIN DESCRIPTION:21022940}  SAFETY ISSUES: Prior radiation? {:18581} Pacemaker/ICD? {:18581} Possible current pregnancy?{:18581} Is the patient on methotrexate? {:18581}  Current Complaints / other details:  ***

## 2022-06-05 ENCOUNTER — Ambulatory Visit: Payer: Federal, State, Local not specified - PPO | Attending: General Surgery | Admitting: Physical Therapy

## 2022-06-05 ENCOUNTER — Encounter: Payer: Self-pay | Admitting: Physical Therapy

## 2022-06-05 DIAGNOSIS — R600 Localized edema: Secondary | ICD-10-CM | POA: Insufficient documentation

## 2022-06-05 DIAGNOSIS — R293 Abnormal posture: Secondary | ICD-10-CM | POA: Diagnosis not present

## 2022-06-05 DIAGNOSIS — C50911 Malignant neoplasm of unspecified site of right female breast: Secondary | ICD-10-CM | POA: Diagnosis not present

## 2022-06-05 DIAGNOSIS — C773 Secondary and unspecified malignant neoplasm of axilla and upper limb lymph nodes: Secondary | ICD-10-CM | POA: Diagnosis not present

## 2022-06-05 DIAGNOSIS — Z483 Aftercare following surgery for neoplasm: Secondary | ICD-10-CM | POA: Insufficient documentation

## 2022-06-05 DIAGNOSIS — M25611 Stiffness of right shoulder, not elsewhere classified: Secondary | ICD-10-CM | POA: Diagnosis not present

## 2022-06-05 NOTE — Progress Notes (Signed)
Radiation Oncology         ([phone]) [phone] ________________________________  Initial Outpatient Consultation  Name: Brooke Marks MRN: [id number]  Date: 06/06/2022  DOB: 01-15-1981  LP:[id number], Fenton Malling, DO  Nicholas Lose, MD   REFERRING PHYSICIAN: Nicholas Lose, MD  DIAGNOSIS:    ICD-10-CM   1. Malignant neoplasm of overlapping 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, estrogen receptor positive ([place])  C50.811 Pregnancy, urine   Z17.0        Cancer Staging  Malignant neoplasm of overlapping 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, estrogen receptor positive ([place]) Staging form: Breast, AJCC 8th Edition - Clinical stage from 03/17/2022: Stage IB (cT1b, cN1, cM0, G2, ER+, PR+, HER2-) - Signed by Nicholas Lose, MD on 03/17/2022 Stage prefix: Initial diagnosis Histologic grading system: 3 grade system - Pathologic: Stage IB (pT3, pN1, cM0, G2, ER+, PR+, HER2-) - Signed by Nicholas Lose, MD on 05/24/2022 Histologic grading system: 3 grade system   Stage IB (pT3, pN1, cM0) Invasive and in situ ductal carcinoma of overlapping sites of the right breast, ER+ / PR+ / Her2-, Grade 2 : s/p right mastectomy and right TAD with clean margins and 1 positive lymph node   CHIEF COMPLAINT: Here to discuss management of right breast cancer  HISTORY OF PRESENT ILLNESS::Brooke Marks is a 42 y.o. female who presented with a palpable right breast lump this past October (2023). This prompted a diagnostic bilateral breast mammogram and right breast ultrasound on 02/22/22 which revealed: a superficial irregular mass with internal vascularity measuring 0.8 x 0.7 x 0.4 cm in the 11 o'clock right breast located 5 cmfn, correlating with the palpable abnormality; an additional irregular hypoechoic mass measuring 0.6 x 0.8 x 0.5 cm in the 11 o'clock right breast located 6 cmfn; and a suspicious mass with calcifications measuring 0.5 x 0.4 x 0.3 cm in the 11 o'clock right breast located 7 cmfn. Targeted US also revealed a  single abnormal right axillary lymph node measuring 0.6 cm. Otherwise, no evidence of malignancy in the left breast was appreciated malignancy.     Biopsies of the 11 o'clock right breast 5 cmfn, upper inner right breast breast calcifications, and upper outer right breast calcifications on the date of 03/08/22 all showed grade 2 invasive ductal carcinoma with intermediate grade DCIS.  ER status: 70% positive with moderate staining intensity; PR status 90% positive with strong staining intensity; Proliferation marker Ki67 at 20%; Her2 status negative; Grade 2.  Biopsy of the singly abnormal right axillary lymph node showed metastatic IDC.   Accordingly, the patient was referred to Dr. Donne Hazel on 03/14/22 to discuss treatment options. During this visit, the patient endorsed body pain including pain to the right humerus. Given her pains, Dr. Donne Hazel advised proceeding with a bone scan and CT CAP (in addition to a bilateral breast MRI and genetic testing) to rule out any additional sites of metastases prior to pursuing surgery. The patient was also referred to Dr. Lindi Adie who recommended mammaprint testing to determine the role of chemotherapy.  Bilateral breast MRI on 03/16/22 redemonstrated the 3 sites of biopsy proven invasive ductal carcinoma in the right breast, as well as the biopsy proven right axillary metastatic carcinoma. MRI also revealed: multiple additional irregular enhancing non-biopsied suspicious masses within the superior aspect of the right breast involving the anterior and middle third, extensive non-mass enhancement extending approximately 11 cm posterior from the nipple areolar complex involving the anterior 2/3 of the upper half of the right breast (concerning for extensive  malignant process involving the right breast), and an indeterminate cortically thickened left axillary lymph node. MRI otherwise showed no other suspicious areas of enhancement within the left breast.   Left  axillary ultrasound on 03/28/22 redemonstrated the indeterminate thickened left axillary lymph node.   Biopsy of the left axillary lymph node on 03/28/22 showed no evidence of carcinoma.   Genetic testing collected on 03/17/22 showed no clinically significant variants detected by BRCAplus pr +RNAinsight testing. 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   Whole body bone scan on 03/28/22 showed no evidence of bony metastatic disease.   CT CAP on 03/30/22 redemonstrated the mildly enlarged bilateral axillary lymph nodes. CT otherwise showed no definite evidence of metastatic disease in the chest, abdomen, or pelvis. (CT did demonstrate a 6.6 x 3.9 cm multicystic lesion in the left adnexal space. This prompted a pelvic ultrasound on 04/18/22 which showed 2 separate cysts within the left ovary vs a larger cyst with an internal septation).   The patient opted to proceed with a right mastectomy with SLN biopsies on 05/08/22 under the care of Dr. Donne Hazel. Pathology from the procedure revealed: tumor the size of 8 cm; histology of grade 2 invasive ductal carcinoma with intermediate grade DCIS, LVI, necrosis, and calcifications; all margins negative for invasive and in situ carcinoma; margin status to invasive and in situ disease of 25 mm from the posterior margin; nodal status of 1/4 right axillary sentinel lymph node excisions positive for metastatic carcinoma measuring 2.2 cm, with focal extracapsular extension. ER status: 70% positive with moderate staining intensity; PR status 90% positive with strong staining intensity; Proliferation marker Ki67 at 20%; Her2 status negative; Grade 2.   Mammaprint results showed: low-risk. Based on mammaprint results, the patient will not require chemotherapy. She will return to Dr. Lindi Adie in the near future discuss antiestrogen treatment options.   She is present today with her supportive husband. She reports to be healing well from her  mastectomy. She recently saw PT who told her things were going well aside from some cording. She denies any arm swelling or drainage.   She is sexually active.  There is no robust form of contraception or birth control currently being used (currently using pull out method).  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    PAST SURGICAL HISTORY: Past Surgical History:  Procedure Laterality Date   BREAST BIOPSY  05/05/2022   [place] RT RADIOACTIVE SEED LOC 05/05/2022 [facility] MAMMOGRAPHY   CESAREAN SECTION     CESAREAN SECTION N/A 01/27/2016   Procedure: CESAREAN SECTION;  Surgeon: Janyth Contes, MD;  Location: [place];  Service: Obstetrics;  Laterality: N/A;  MD requests RNFA   COLON RESECTION N/A 03/30/2019   Procedure: LAPAROSCOPIC RIGHT COLECTOMY WITH RIGHT SALPINGO OOPHORECTOMY TAP BLOCK;  Surgeon: Michael Boston, MD;  Location: [facility];  Service: General;  Laterality: N/A;   LAPAROSCOPIC LYSIS OF ADHESIONS N/A 03/30/2019   Procedure: LAPAROSCOPIC LYSIS OF ADHESIONS;  Surgeon: Michael Boston, MD;  Location: [facility];  Service: General;  Laterality: N/A;   MASTECTOMY W/ SENTINEL NODE BIOPSY Right 05/08/2022   Procedure: RIGHT MASTECTOMY WITH AXILLARY SENTINEL LYMPH NODE BIOPSY;  Surgeon: Rolm Bookbinder, MD;  Location: [place];  Service: General;  Laterality: Right;   RADIOACTIVE SEED GUIDED AXILLARY SENTINEL LYMPH NODE Right 05/08/2022   Procedure: RADIOACTIVE SEED GUIDED RIGHT AXILLARY SENTINEL LYMPH NODE EXCISION;  Surgeon: Rolm Bookbinder, MD;  Location: [place];  Service: General;  Laterality: Right;   WISDOM TOOTH  EXTRACTION      FAMILY HISTORY: family history includes Cancer in her cousin; Diabetes in her mother; Hypertension in her father; Lung cancer in her maternal aunt; Prostate cancer (age of onset: 87) in her maternal grandfather; Stomach cancer in her maternal uncle.  SOCIAL HISTORY:  reports that she has never smoked. She has never been exposed to tobacco smoke. She has never used smokeless tobacco. She reports that she does not drink alcohol and does not use drugs.  ALLERGIES: Patient has no known allergies.  MEDICATIONS:  Current Outpatient Medications  Medication Sig Dispense Refill   amLODipine (NORVASC) 10 MG tablet TAKE 1 TABLET BY MOUTH EVERY DAY 90 tablet 3   Multiple Vitamin (MULTIVITAMIN) tablet Take 1 tablet by mouth daily.     No current facility-administered medications for this encounter.    REVIEW OF SYSTEMS: As above in HPI.   PHYSICAL EXAM:  height is '5\' 2"'$  (1.575 m) and weight is 179 lb 2 oz (81.3 kg). Her temporal temperature is 97.9 F (36.6 C). Her blood pressure is 136/96 (abnormal) and her pulse is 100. Her respiration is 18.   General: Alert and oriented, in no acute distress HEENT: Head is normocephalic. Extraocular movements are intact. Oropharynx is clear. Neck: Neck is supple, no palpable cervical or supraclavicular lymphadenopathy. Heart: Regular in rate and rhythm with no murmurs, rubs, or gallops. Chest: Clear to auscultation bilaterally, with no rhonchi, wheezes, or rales. Abdomen: Soft, nontender, nondistended, with no rigidity or guarding. Extremities: No cyanosis or edema. Lymphatics: see Neck Exam Skin: No concerning lesions. Musculoskeletal: symmetric strength and muscle tone throughout. Neurologic: Cranial nerves II through XII are grossly intact. No obvious focalities. Speech is fluent. Coordination is intact. Psychiatric: Judgment and insight are intact. Affect is appropriate. Chest wall:  healing mastectomy incision along the right chest wall  with some moist oozing still present superficially at the lateral scar. No signs of infection present. Moderate cording palpated in the right axilla. No palpable mass appreciated in the left breast or axillae.    ECOG = 1  0 - Asymptomatic (Fully active, able to carry on all predisease activities without restriction)  1 - Symptomatic but completely ambulatory (Restricted in physically strenuous activity but ambulatory and able to carry out work of a light or sedentary nature. For example, light housework, office work)  2 - Symptomatic, <50% in bed during the day (Ambulatory and capable of all self care but unable to carry out any work activities. Up and about more than 50% of waking hours)  3 - Symptomatic, >50% in bed, but not bedbound (Capable of only limited self-care, confined to bed or chair 50% or more of waking hours)  4 - Bedbound (Completely disabled. Cannot carry on any self-care. Totally confined to bed or chair)  5 - Death   Eustace Pen MM, Creech RH, Tormey DC, et al. [phone]). "Toxicity and response criteria of the [facility]". [place] Oncol. 5 (6): 649-55   LABORATORY DATA:  Lab Results  Component Value Date   WBC 6.9 04/09/2019   HGB 10.1 (L) 04/09/2019   HCT 30.9 (L) 04/09/2019   MCV 77 (L) 04/09/2019   PLT 445 04/09/2019   CMP  Component Value Date/Time   NA 141 04/09/2019 1230   K 3.9 04/09/2019 1230   CL 104 04/09/2019 1230   CO2 26 04/09/2019 1230   GLUCOSE 93 04/09/2019 1230   GLUCOSE 107 (H) 04/04/2019 1315   BUN 12 04/09/2019 1230   CREATININE 0.50 03/30/2022 1505   CALCIUM 9.1 04/09/2019 1230   PROT 8.1 04/04/2019 1315   PROT 7.6 08/06/2017 1711   ALBUMIN 4.0 04/04/2019 1315   ALBUMIN 4.3 08/06/2017 1711   AST 34 04/04/2019 1315   ALT 26 04/04/2019 1315   ALKPHOS 52 04/04/2019 1315   BILITOT 0.6 04/04/2019 1315   BILITOT 0.2 08/06/2017 1711   GFRNONAA 118 04/09/2019 1230   GFRAA 136 04/09/2019 1230          RADIOGRAPHY: MM Breast Surgical Specimen  Result Date: 05/08/2022 CLINICAL DATA:  Specimen radiograph. EXAM: SPECIMEN RADIOGRAPH OF THE RIGHT AXILLA. COMPARISON:  Previous exam(s). FINDINGS: Status post excision of the RIGHT AXILLA. The radioactive seed and biopsy marker clip are present, completely intact, and were marked for pathology. IMPRESSION: Specimen radiograph of the RIGHT AXILLA. Electronically Signed   By: Lillia Mountain M.D.   On: 05/08/2022 08:23     IMPRESSION/PLAN: Stage IB (pT3, pN1, cM0) Invasive and in situ ductal carcinoma of overlapping sites of the right breast, ER+ / PR+ / Her2-, Grade 2 : s/p right mastectomy and right TAD with clean margins and 1 positive lymph node    It was a pleasure meeting the patient today. She is healing well from her Mastectomy on 05/08/22. We discussed the risks, benefits, and side effects of radiotherapy. I recommend radiotherapy to the right chest wall and axilla to reduce her risk of locoregional recurrence by 2/3.  We discussed that radiation would take approximately 6 weeks to complete and that I would give the patient a few weeks to heal following surgery before starting treatment planning. We spoke about acute effects including skin irritation and fatigue as well as much less common late effects including lymphedema, internal organ injury or irritation, secondary malignancy, injury to soft tissues, bones, nerves. We spoke about the latest technology that is used to minimize the risk of late effects for patients undergoing radiotherapy to the breast or chest wall. No guarantees of treatment were given. The patient is enthusiastic about proceeding with treatment. I look forward to participating in the patient's care. Consent form was signed today and patient will be scheduled for CT simulation/treatment planning next week.  She reports she has no intention to undergo breast reconstruction in the future.  We discussed birth control today.  We discussed  that the pullout method is not a reliable way to avoid pregnancy.  Urine pregnancy test will be conducted today.  We discussed the importance of using condoms until another method is chosen by the patient (she is considering tubal ligation).  She understands importance of avoiding pregnancy during radiation therapy as radiation can be harmful to an unborn fetus.  She is a non-smoker.  She understands there is a small risk of injury to the lung from radiation but this risk is reduced by not smoking.  The patient and her husband had very good questions which I answered to their satisfaction.  I look forward to participating in the care of this wonderful patient.  On date of service, in total, I spent 60 minutes on this encounter. Patient was seen in person.   __________________________________________    Leona Singleton, PA   Eppie Gibson, MD  This document serves as a record of services personally performed by Eppie Gibson, MD. It was created on her behalf by Roney Mans, a trained medical scribe. The creation of this record is based on the scribe's personal observations and the provider's statements to them. This document has been checked and approved by the attending provider.

## 2022-06-06 ENCOUNTER — Encounter: Payer: Self-pay | Admitting: *Deleted

## 2022-06-06 ENCOUNTER — Other Ambulatory Visit: Payer: Self-pay

## 2022-06-06 ENCOUNTER — Ambulatory Visit
Admission: RE | Admit: 2022-06-06 | Discharge: 2022-06-06 | Disposition: A | Discharge status: Home / Self Care | Payer: Federal, State, Local not specified - PPO | Source: Ambulatory Visit | Attending: Radiation Oncology | Admitting: Radiation Oncology

## 2022-06-06 VITALS — BP 136/96 | HR 100 | Temp 97.9°F | Resp 18 | Ht 62.0 in | Wt 179.1 lb

## 2022-06-06 DIAGNOSIS — Z801 Family history of malignant neoplasm of trachea, bronchus and lung: Secondary | ICD-10-CM | POA: Diagnosis not present

## 2022-06-06 DIAGNOSIS — Z170 Estrogen receptor positive status [ER+]: Secondary | ICD-10-CM | POA: Diagnosis not present

## 2022-06-06 DIAGNOSIS — Z9011 Acquired absence of right breast and nipple: Secondary | ICD-10-CM | POA: Diagnosis not present

## 2022-06-06 DIAGNOSIS — I10 Essential (primary) hypertension: Secondary | ICD-10-CM | POA: Diagnosis not present

## 2022-06-06 DIAGNOSIS — C50811 Malignant neoplasm of overlapping sites of right female breast: Secondary | ICD-10-CM | POA: Insufficient documentation

## 2022-06-06 DIAGNOSIS — M79621 Pain in right upper arm: Secondary | ICD-10-CM | POA: Diagnosis not present

## 2022-06-06 DIAGNOSIS — Z800 Family history of malignant neoplasm of digestive organs: Secondary | ICD-10-CM | POA: Diagnosis not present

## 2022-06-06 DIAGNOSIS — E119 Type 2 diabetes mellitus without complications: Secondary | ICD-10-CM | POA: Diagnosis not present

## 2022-06-06 DIAGNOSIS — Z79899 Other long term (current) drug therapy: Secondary | ICD-10-CM | POA: Insufficient documentation

## 2022-06-06 DIAGNOSIS — R590 Localized enlarged lymph nodes: Secondary | ICD-10-CM | POA: Diagnosis not present

## 2022-06-06 LAB — PREGNANCY, URINE: Preg Test, Ur: NEGATIVE

## 2022-06-07 ENCOUNTER — Encounter: Payer: Self-pay | Admitting: Hematology and Oncology

## 2022-06-08 ENCOUNTER — Ambulatory Visit: Payer: Federal, State, Local not specified - PPO

## 2022-06-08 ENCOUNTER — Other Ambulatory Visit: Payer: Self-pay | Admitting: Student

## 2022-06-08 ENCOUNTER — Encounter (HOSPITAL_COMMUNITY): Payer: Self-pay

## 2022-06-08 DIAGNOSIS — M25611 Stiffness of right shoulder, not elsewhere classified: Secondary | ICD-10-CM

## 2022-06-08 DIAGNOSIS — R600 Localized edema: Secondary | ICD-10-CM

## 2022-06-08 DIAGNOSIS — R293 Abnormal posture: Secondary | ICD-10-CM | POA: Diagnosis not present

## 2022-06-08 DIAGNOSIS — Z483 Aftercare following surgery for neoplasm: Secondary | ICD-10-CM

## 2022-06-08 DIAGNOSIS — C773 Secondary and unspecified malignant neoplasm of axilla and upper limb lymph nodes: Secondary | ICD-10-CM | POA: Diagnosis not present

## 2022-06-08 DIAGNOSIS — N6320 Unspecified lump in the left breast, unspecified quadrant: Secondary | ICD-10-CM

## 2022-06-08 DIAGNOSIS — C50911 Malignant neoplasm of unspecified site of right female breast: Secondary | ICD-10-CM | POA: Diagnosis not present

## 2022-06-08 NOTE — Therapy (Addendum)
OUTPATIENT PHYSICAL THERAPY BREAST CANCER TREATMENT   Patient Name: Brooke Marks MRN: [id number] DOB:Feb 28, 1981, 42 y.o., female Today's Date: 06/08/2022  END OF SESSION:  PT End of Session - 06/08/22 1012     Visit Number 3    Number of Visits 10    Date for PT Re-Evaluation 07/03/22    PT Start Time 1010    PT Stop Time 1110    PT Time Calculation (min) 60 min    Activity Tolerance Patient tolerated treatment well    Behavior During Therapy [facility] for tasks assessed/performed             Past Medical History:  Diagnosis Date   Anxiety    Cancer ([place])    breast   Chlamydia    Depression    Gestational diabetes mellitus 12/17/2018   Hypertension    Panic attack with specific phobia 04/11/2010   Panic attacks specifically with telephone ring. Rarely uses her cell phone for calls. Usually communicates via text. Affecting her job. Works with [employer]. 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 Concerned that her employer is planning to return her previous position. Denies agoraphobia.      PIH (pregnancy induced hypertension) 01/27/2016   PONV (postoperative nausea and vomiting)    S/P cesarean section 01/27/2016   Past Surgical History:  Procedure Laterality Date   BREAST BIOPSY  05/05/2022   [place] RT RADIOACTIVE SEED LOC 05/05/2022 [facility]AMMOGRAP[facility]   CESAREAN SECTION     CESAREAN SECTION N/A 01/27/2016   Procedure: CESAREAN SECTION;  Surgeon: Janyth Contes, MD;  Location: [place];  Service: Obstetrics;  Laterality: N/A;  MD requests RNFA   COLON RESECTION N/A 03/30/2019   Procedure: LAPAROSCOPIC RIGHT COLECTOMY WITH RIGHT SALPINGO OOPHORECTOMY TAP BLOCK;  Surgeon: Michael Boston, MD;  [facility];  Service: General;  Laterality: N/A;   LAPAROSCOPIC LYSIS OF ADHESIONS N/A 03/30/2019   Procedure: LAPAROSCOPIC LYSIS OF ADHESIONS;  Surgeon: Michael Boston, MD;  [facility];  Service: General;  Laterality: N/A;   MASTECTOMY W/  SENTINEL NODE BIOPSY Right 05/08/2022   Procedure: RIGHT MASTECTOMY WITH AXILLARY SENTINEL LYMPH NODE BIOPSY;  Surgeon: Rolm Bookbinder, MD;  Location: [place];  Service: General;  Laterality: Right;   RADIOACTIVE SEED GUIDED AXILLARY SENTINEL LYMPH NODE Right 05/08/2022   Procedure: RADIOACTIVE SEED GUIDED RIGHT AXILLARY SENTINEL LYMPH NODE EXCISION;  Surgeon: Rolm Bookbinder, MD;  Location: [place];  Service: General;  Laterality: Right;   [place] EXTRACTION     Patient Active Problem List   Diagnosis Date Noted   S/P mastectomy, right 05/08/2022   Hyperlipidemia 04/11/2022   Genetic testing 03/28/2022   Malignant neoplasm of overlapping 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, estrogen receptor positive ([place]) 03/14/2022   Prediabetes 03/14/2022   Invasive ductal carcinoma of breast, stage 2, right ([place]) 02/14/2022   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/30/2019 03/30/2019   Hypertension 01/27/2016   S/P cesarean section 01/27/2016   Anxiety state 04/13/2015   CARPAL TUNNEL SYNDROME, LEFT 04/11/2010   HERPES SIMPLEX INFECTION, RECURRENT 02/26/2009   Obesity (BMI 30.0-34.9) 07/19/2006    PCP: [name], DO   REFERRING PROVIDER: Rolm Bookbinder, MD  REFERRING DIAG: [phone] (ICD-10-CM) - Malignant neoplasm of unspecified site of right female breast C77.3 (ICD-10-CM) - Secondary and unspecified malignant neoplasm of axilla and upper limb lymph nodes    THERAPY DIAG:  Stiffness of right shoulder, not elsewhere classified  Aftercare following surgery for neoplasm  Localized edema  Abnormal posture  Malignant neoplasm of right female breast, unspecified estrogen receptor status, unspecified site of breast ([place])  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 My Rt arm doesn't hurt, just feels tight. I missed the ABC class the other day due to an appt with the radiation oncologist.   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 It is ER/PR +, HER2-. Ki67 20%. It is located in different locations in the R breast.  05/08/22 R mastectomy and SLNB 1/4  PATIENT GOALS:  Reassess how my recovery is going related to arm function, pain, and swelling.  PAIN:  Are you having pain? No  PRECAUTIONS: Recent Surgery, right UE Lymphedema risk,   ACTIVITY LEVEL / LEISURE: pt has been exercising, running and walking   OBJECTIVE:   PATIENT SURVEYS:  QUICK DASH:     OBSERVATIONS: Healing mastectomy scar with steristrips in place, still open on lateral edge where skin is folded, fullness superior to scar at lateral edge, multiple cords present in R axilla  POSTURE:  Forward head and rounded shoulders posture   UPPER EXTREMITY AROM/PROM:   A/PROM RIGHT   eval   RIGHT 06/05/22  Shoulder extension 85 80  Shoulder flexion 169 156  Shoulder abduction 167 152  Shoulder internal rotation 43 60  Shoulder external rotation 80 79                          (Blank rows = not tested)   A/PROM LEFT   eval  Shoulder extension 85  Shoulder flexion 160  Shoulder abduction 172  Shoulder internal rotation 51  Shoulder external rotation 93                          (Blank rows = not tested)     CERVICAL AROM: All within normal limits:      Percent limited  Flexion WFL  Extension WFL  Right lateral flexion WFL  Left lateral flexion WFL  Right rotation WFL  Left rotation WFL      UPPER EXTREMITY STRENGTH: 5/5   LYMPHEDEMA ASSESSMENTS:  LANDMARK RIGHT   eval RIGHT 06/05/22  10 cm proximal to olecranon  process 33.5 33.4  Olecranon process 27.5 27.5  10 cm proximal to ulnar styloid process 23.9 23.4  Just proximal to ulnar styloid process 16.5 16  Across hand at thumb web space 19.5 19.5  At base of 2nd digit 6.2 6  (Blank rows = not tested)   LANDMARK LEFT   eval  10 cm proximal to olecranon process 33  Olecranon process 27  10 cm proximal to ulnar styloid process 23  Just proximal to ulnar styloid process 16.3  Across hand at thumb web space 19.9  At base of 2nd digit 6.2  (Blank rows = not tested)  Surgery type/Date: 05/08/22 R mastectomy and SLNB Number of lymph nodes removed: 1/4 Current/past treatment (chemo, radiation, hormone therapy): will begin radiation soon Other symptoms:  Heaviness/tightness Yes Pain No Pitting edema No Infections No Decreased scar mobility Yes Stemmer sign No  PATIENT EDUCATION:  Education details: axillary cording, importance of stretching, ABC class- pt is attending today, importance of exercise to reduce risk of recurrence Person educated: Patient Education method: Explanation Education comprehension: verbalized understanding  TREATMENT PERFORMED: 06/08/22: Therapeutic Exercises Pulleys into flexion and abd x2 mins each returning therapist demo Roll yellow ball up wall into flexion x10 and Rt UE abd x5 returning demo  Modified downward dog on wall 5x,  5 sec holds Manual Therapy PROM to Rt shoulder in direction of flexion, abduction, and D2 MFR to cording in Rt axilla  Self Care Educated pt during session today about lymphedema risk reduction and infection prevention. Also explained how to progress exercises "low and slow" once she gets to the point of resuming exercises. Answered pts questions regarding this during as well.   06/05/22: PROM to R shoulder in direction of flexion and abduction  MFR to cording in R axilla with pt reporting decreased tightness and discomfort with ROM following this  HOME EXERCISE PROGRAM: Reviewed  previously given post op HEP.  PATIENT EDUCATION: Education details: Lymphedema risk reduction and infection prevention practices    Person educated: Patient Education method: Explanation and Handouts Education comprehension: verbalized understanding     ASSESSMENT:  CLINICAL IMPRESSION: Started with AA/ROM exercises and pt tolerated this well reporting feeling good stretches with these. Min VC's to decrease Rt scapular compensation during. Continued with manual therapy working to decrease Rt axillary cording. Also educated pt in lymphedema risk reduction and infection prevention, answering her questions throughout.   Pt will benefit from skilled therapeutic intervention to improve on the following deficits: Decreased knowledge of precautions, impaired UE functional use, pain, decreased ROM, postural dysfunction.   PT treatment/interventions: ADL/Self care home management, Therapeutic exercises, Therapeutic activity, Patient/Family education, Self Care, Joint mobilization, Orthotic/Fit training, Manual lymph drainage, Compression bandaging, scar mobilization, Taping, Manual therapy, and Re-evaluation   GOALS: Goals reviewed with patient? Yes  LONG TERM GOALS:  (STG=LTG)  GOALS Name Target Date  Goal status  1 Pt will demonstrate she has regained full shoulder ROM and function post operatively compared to baselines.  Baseline: 07/03/22 INITIAL  2 Pt will demonstrate 169 degrees of R shoulder flexion to allow her to reach overhead. 07/03/22 INITIAL  3 Pt will demonstrate 167 degrees of R shoulder abduction to allow her to reach out to the side.  07/03/22 INITIAL  4 Pt will not demonstrate any cording at end range to allow improved comfort. 07/03/22 INITIAL  5 Pt will be independent in a home exercise program  for continued strengthening and stretching.  07/03/22 INITIAL     PLAN:  PT FREQUENCY/DURATION: 2x/wk for 4 wks  PLAN FOR NEXT SESSION: Cont PROM to Rt shoulder, pulleys, ball,  eventually supine scap, MLD to area of edema superior to lateral mastectomy scar   [facility]  [address], [address]  [place] [address]  [phone]   Otelia Limes, PTA 06/08/2022, 12:36 PM

## 2022-06-12 ENCOUNTER — Telehealth: Payer: Self-pay

## 2022-06-12 ENCOUNTER — Ambulatory Visit
Admission: RE | Admit: 2022-06-12 | Discharge: 2022-06-12 | Disposition: A | Discharge status: Home / Self Care | Payer: Federal, State, Local not specified - PPO | Source: Ambulatory Visit | Attending: Radiation Oncology | Admitting: Radiation Oncology

## 2022-06-12 DIAGNOSIS — Z170 Estrogen receptor positive status [ER+]: Secondary | ICD-10-CM | POA: Diagnosis not present

## 2022-06-12 DIAGNOSIS — C50811 Malignant neoplasm of overlapping sites of right female breast: Secondary | ICD-10-CM | POA: Diagnosis not present

## 2022-06-12 NOTE — Telephone Encounter (Signed)
RN Anderson Malta called Triage RN at [facility] to get an appointment with Dr. Donne Hazel for pt due to poor wound healing and concerns. Triage Nurse stated that she would have to message Dr. Donne Hazel to see if she can see a PA instead. They will call back tomorrow with this follow up. Rn Anderson Malta gave best contact information for response.

## 2022-06-12 NOTE — Telephone Encounter (Signed)
Pt called RN to inquire about time of Ct sim and also about the status of her FMLA paperwork. She is also requesting that her radiation treatments be earlier if possible. Rn looked at data base for [facility] and there is no update at this time. Rn will reach out to [place] for update. Pt will touch base with radiation staff to get her schedule changed when she goes for ct sim.

## 2022-06-13 ENCOUNTER — Ambulatory Visit: Payer: Federal, State, Local not specified - PPO | Admitting: Physical Therapy

## 2022-06-13 ENCOUNTER — Encounter: Payer: Self-pay | Admitting: *Deleted

## 2022-06-14 ENCOUNTER — Ambulatory Visit
Admission: RE | Admit: 2022-06-14 | Discharge: 2022-06-14 | Disposition: A | Discharge status: Home / Self Care | Payer: Federal, State, Local not specified - PPO | Source: Ambulatory Visit | Attending: Family Medicine | Admitting: Family Medicine

## 2022-06-14 ENCOUNTER — Telehealth: Payer: Self-pay

## 2022-06-14 DIAGNOSIS — Z170 Estrogen receptor positive status [ER+]: Secondary | ICD-10-CM | POA: Diagnosis not present

## 2022-06-14 DIAGNOSIS — C50811 Malignant neoplasm of overlapping sites of right female breast: Secondary | ICD-10-CM | POA: Diagnosis not present

## 2022-06-14 DIAGNOSIS — R928 Other abnormal and inconclusive findings on diagnostic imaging of breast: Secondary | ICD-10-CM | POA: Diagnosis not present

## 2022-06-14 DIAGNOSIS — N6320 Unspecified lump in the left breast, unspecified quadrant: Secondary | ICD-10-CM

## 2022-06-14 DIAGNOSIS — N6002 Solitary cyst of left breast: Secondary | ICD-10-CM | POA: Diagnosis not present

## 2022-06-14 NOTE — Telephone Encounter (Signed)
-----  Message from Eppie Gibson, MD sent at 06/14/2022  1:24 PM EST ----- Althia Forts, Can you let her know that I spoke with Dr. Donne Hazel, and that while she may heal fast enough to start next week, I think it's better for [place] to push back her start date to 2/5 to minimize risk of long term wound dehiscence.  Please let her know her prognosis will NOT be impacted by pushing the tx back a week. Please ask therapists to reschedule and call me back to dressing room for PUT prior to fraction #1 so I can verify scar has scabbed over. Thanks! SS  ----- Message ----- From: Rolm Bookbinder, MD Sent: 06/14/2022   1:19 PM EST To: Eppie Gibson, MD  Sarah She had some silver nitrate put on that.  So if that scabs and looks fine which I think it should she should be fine for next week. Matt

## 2022-06-14 NOTE — Telephone Encounter (Signed)
Called and spoke with patient to relay Dr. Pearlie Oyster recommendation about delaying her radiation start date by a week. Patient verbalized understanding and agreement. Denied any other needs, but confirmed she has our direct number should she have any questions/concerns before her first treatment

## 2022-06-15 ENCOUNTER — Encounter: Payer: Federal, State, Local not specified - PPO | Admitting: Physical Therapy

## 2022-06-19 ENCOUNTER — Ambulatory Visit: Payer: Federal, State, Local not specified - PPO | Admitting: Radiation Oncology

## 2022-06-20 ENCOUNTER — Ambulatory Visit: Payer: Federal, State, Local not specified - PPO

## 2022-06-20 ENCOUNTER — Ambulatory Visit: Payer: Federal, State, Local not specified - PPO | Admitting: Physical Therapy

## 2022-06-20 ENCOUNTER — Telehealth: Payer: Self-pay | Admitting: *Deleted

## 2022-06-20 ENCOUNTER — Encounter: Payer: Self-pay | Admitting: Physical Therapy

## 2022-06-20 DIAGNOSIS — R293 Abnormal posture: Secondary | ICD-10-CM

## 2022-06-20 DIAGNOSIS — M25611 Stiffness of right shoulder, not elsewhere classified: Secondary | ICD-10-CM

## 2022-06-20 DIAGNOSIS — R600 Localized edema: Secondary | ICD-10-CM

## 2022-06-20 DIAGNOSIS — Z483 Aftercare following surgery for neoplasm: Secondary | ICD-10-CM

## 2022-06-20 DIAGNOSIS — C773 Secondary and unspecified malignant neoplasm of axilla and upper limb lymph nodes: Secondary | ICD-10-CM | POA: Diagnosis not present

## 2022-06-20 DIAGNOSIS — C50911 Malignant neoplasm of unspecified site of right female breast: Secondary | ICD-10-CM

## 2022-06-20 NOTE — Telephone Encounter (Signed)
06/16/2022 This nurse completed FMLA forms for [place] and spouse to collaborative bin for provider review, signature and return.   06/20/2022: Connected with patient to clarify sticky note on cover sheet reads "For [place], intermittent up to 5 days for 8 hrs a day.  For Suezanne Cheshire intermittent up to 5 days for 12 hrs a day." Advised she described continuous leave.  May have both yet a start and end date for each type is required.  They do not occur simultaneously.  Asked if needs to leave work at 2:00 pm to arrive for 3:00 appointments due to work hours are 9 am  to 5:00 pm.   "Have been out of work since 05/04/2022 which ends 06/30/2022 under Dr. Donne Hazel.  Radiation begins 06/27/2022.  I have therapy and other appointments.  Can't I have both in case I have appointments Mon through Wed and need to be out Thurs and Fri?  Keep me on continuous leave.  Yes, I understand it expires in 12 weeks and may need an accommodation.  I have another FMLA form for my PCP.  Do not believe I can function or work with anxiety and depression. My husband's leave should also be a continuous LOA." Currently no further questions or needs.  Forms successfully returned.  Advised [place] she may pick up copies upon next scheduled appointment from registration area number one.

## 2022-06-20 NOTE — Therapy (Addendum)
 OUTPATIENT PHYSICAL THERAPY BREAST CANCER TREATMENT   Patient Name: Brooke Marks MRN: [id number] DOB:1981/05/12, 42 y.o., female Today's Date: 06/20/2022  END OF SESSION:  PT End of Session - 06/20/22 1552     Visit Number 4    Number of Visits 10    Date for PT Re-Evaluation 07/03/22    PT Start Time 1504    PT Stop Time 1552    PT Time Calculation (min) 48 min    Activity Tolerance Patient tolerated treatment well    Behavior During Therapy [facility] for tasks assessed/performed              Past Medical History:  Diagnosis Date   Anxiety    Cancer ([facility])    breast   Chlamydia    Depression    Gestational diabetes mellitus 12/17/2018   Hypertension    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 Rarely uses her cell phone for calls. Usually communicates via text. Affecting her job. Works with [employer]. 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 Concerned that her employer is planning to return her previous position. Denies agoraphobia.      PIH (pregnancy induced hypertension) 01/27/2016   PONV (postoperative nausea and vomiting)    S/P cesarean section 01/27/2016   Past Surgical History:  Procedure Laterality Date   BREAST BIOPSY  05/05/2022   [place]  RT RADIOACTIVE SEED LOC 05/05/2022 [facility]AMMOGRAPHY   CESAREAN SECTION     CESAREAN SECTION N/A 01/27/2016   Procedure: CESAREAN SECTION;  Surgeon: Ezzie Buba, MD;  [facility];  Service: Obstetrics;  Laterality: N/A;  MD requests RNFA   COLON RESECTION N/A 03/30/2019   Procedure: LAPAROSCOPIC RIGHT COLECTOMY WITH RIGHT SALPINGO OOPHORECTOMY TAP BLOCK;  Surgeon: Sheldon Standing, MD;  [facility];  Service: General;  Laterality: N/A;   LAPAROSCOPIC LYSIS OF ADHESIONS N/A 03/30/2019   Procedure: LAPAROSCOPIC LYSIS OF ADHESIONS;  Surgeon: Sheldon Standing, MD;  [facility];  Service: General;  Laterality: N/A;   MASTECTOMY W/  SENTINEL NODE BIOPSY Right 05/08/2022   Procedure: RIGHT MASTECTOMY WITH AXILLARY SENTINEL LYMPH NODE BIOPSY;  Surgeon: [name], MD;  Location: [facility];  Service: General;  Laterality: Right;   RADIOACTIVE SEED GUIDED AXILLARY SENTINEL LYMPH NODE Right 05/08/2022   Procedure: RADIOACTIVE SEED GUIDED RIGHT AXILLARY SENTINEL LYMPH NODE EXCISION;  Surgeon: [name], MD;  Location: [facility];  Service: General;  Laterality: Right;   WISDOM TOOTH EXTRACTION     Patient Active Problem List   Diagnosis Date Noted   S/P mastectomy, right 05/08/2022   Hyperlipidemia 04/11/2022   Genetic testing 03/28/2022   Malignant neoplasm of overlapping 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, estrogen receptor positive ([facility]) 03/14/2022   Prediabetes 03/14/2022   Invasive ductal carcinoma of breast, stage 2, right ([facility]) 02/14/2022   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/30/2019 03/30/2019   Hypertension 01/27/2016   S/P cesarean section 01/27/2016   Anxiety state 04/13/2015   CARPAL TUNNEL SYNDROME, LEFT 04/11/2010   HERPES SIMPLEX INFECTION, RECURRENT 02/26/2009   Obesity (BMI 30.0-34.9) 07/19/2006    PCP: Kieth Johnson, DO   REFERRING PROVIDER: Cough Ebbie, MD  REFERRING DIAG: [phone] (ICD-10-CM) - Malignant neoplasm of unspecified site of right female breast C77.3 (ICD-10-CM) - Secondary and unspecified malignant neoplasm of axilla and upper limb lymph nodes    THERAPY DIAG:  Stiffness of right shoulder, not elsewhere classified  Aftercare following surgery for neoplasm  Localized edema  Abnormal posture  Malignant neoplasm of right female breast, unspecified estrogen receptor status, unspecified site of breast ([facility])  Rationale for Evaluation and Treatment: Rehabilitation  ONSET DATE: 03/08/22  SUBJECTIVE:                                                                                                                                                                                            SUBJECTIVE STATEMENT: I still feel the tightness but I am able to sleep on my side again. I am supposed to start radiation next week.   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 It is ER/PR +, HER2-. Ki67 20%. It is located in different locations in the R breast.  05/08/22 R mastectomy and SLNB 1/4  PATIENT GOALS:  Reassess how my recovery is going related to arm function, pain, and swelling.  PAIN:  Are you having pain? No  PRECAUTIONS: Recent Surgery, right UE Lymphedema risk,   ACTIVITY LEVEL / LEISURE: pt has been exercising, running and walking   OBJECTIVE:   PATIENT SURVEYS:  QUICK DASH:     OBSERVATIONS: Healing mastectomy scar with steristrips in place, still open on lateral edge where skin is folded, fullness superior to scar at lateral edge, multiple cords present in R axilla  POSTURE:  Forward head and rounded shoulders posture   UPPER EXTREMITY AROM/PROM:   A/PROM RIGHT   eval   RIGHT 06/05/22  Shoulder extension 85 80  Shoulder flexion 169 156  Shoulder abduction 167 152  Shoulder internal rotation 43 60  Shoulder external rotation 80 79                          (Blank rows = not tested)   A/PROM LEFT   eval  Shoulder extension 85  Shoulder flexion 160  Shoulder abduction 172  Shoulder internal rotation 51  Shoulder external rotation 93                          (Blank rows = not tested)     CERVICAL AROM: All within normal limits:      Percent limited  Flexion WFL  Extension WFL  Right lateral flexion WFL  Left lateral flexion WFL  Right rotation WFL  Left rotation WFL      UPPER EXTREMITY STRENGTH: 5/5   LYMPHEDEMA ASSESSMENTS:    LANDMARK RIGHT   eval RIGHT 06/05/22  10 cm proximal to olecranon process 33.5 33.4  Olecranon  process 27.5 27.5  10 cm proximal to ulnar styloid process 23.9 23.4  Just proximal to ulnar  styloid process 16.5 16  Across hand at thumb web space 19.5 19.5  At base of 2nd digit 6.2 6  (Blank rows = not tested)   LANDMARK LEFT   eval  10 cm proximal to olecranon process 33  Olecranon process 27  10 cm proximal to ulnar styloid process 23  Just proximal to ulnar styloid process 16.3  Across hand at thumb web space 19.9  At base of 2nd digit 6.2  (Blank rows = not tested)  Surgery type/Date: 05/08/22 R mastectomy and SLNB Number of lymph nodes removed: 1/4 Current/past treatment (chemo, radiation, hormone therapy): will begin radiation soon Other symptoms:  Heaviness/tightness Yes Pain No Pitting edema No Infections No Decreased scar mobility Yes Stemmer sign No  PATIENT EDUCATION:  Education details: axillary cording, importance of stretching, ABC class- pt is attending today, importance of exercise to reduce risk of recurrence Person educated: Patient Education method: Explanation Education comprehension: verbalized understanding  TREATMENT PERFORMED: 06/20/22: Therapeutic Exercises Pulleys into flexion and abd x2 mins each returning therapist demo Roll yellow ball up wall into flexion x10 and Rt UE abd x10 returning demo  Manual Therapy PROM to Rt shoulder in direction of flexion, abduction, and D2 MFR to cording in Rt axilla and down R upper arm with numerous cords palpable in R axilla  06/08/22: Therapeutic Exercises Pulleys into flexion and abd x2 mins each returning therapist demo Roll yellow ball up wall into flexion x10 and Rt UE abd x5 returning demo  Modified downward dog on wall 5x,  5 sec holds Manual Therapy PROM to Rt shoulder in direction of flexion, abduction, and D2 MFR to cording in Rt axilla  Self Care Educated pt during session today about lymphedema risk reduction and infection prevention. Also explained how to progress exercises low and slow once she gets to the point of resuming exercises. Answered pts questions regarding this during  as well.   06/05/22: PROM to R shoulder in direction of flexion and abduction  MFR to cording in R axilla with pt reporting decreased tightness and discomfort with ROM following this  HOME EXERCISE PROGRAM: Reviewed previously given post op HEP.  PATIENT EDUCATION: Education details: Lymphedema risk reduction and infection prevention practices    Person educated: Patient Education method: Explanation and Handouts Education comprehension: verbalized understanding     ASSESSMENT:  CLINICAL IMPRESSION: Pt is supposed to begin radiation next week but her wound is still draining. She will see her surgeon regarding this tomorrow. She is still having increased tightness in her axilla from cording. Continued with AAROM exercises and MFR to cording to help decrease tightness.   Pt will benefit from skilled therapeutic intervention to improve on the following deficits: Decreased knowledge of precautions, impaired UE functional use, pain, decreased ROM, postural dysfunction.   PT treatment/interventions: ADL/Self care home management, Therapeutic exercises, Therapeutic activity, Patient/Family education, Self Care, Joint mobilization, Orthotic/Fit training, Manual lymph drainage, Compression bandaging, scar mobilization, Taping, Manual therapy, and Re-evaluation   GOALS: Goals reviewed with patient? Yes  LONG TERM GOALS:  (STG=LTG)  GOALS Name Target Date  Goal status  1 Pt will demonstrate she has regained full shoulder ROM and function post operatively compared to baselines.  Baseline: 07/03/22 INITIAL  2 Pt will demonstrate 169 degrees of R shoulder flexion to allow her to reach overhead. 07/03/22 INITIAL  3 Pt will demonstrate 167 degrees of R  shoulder abduction to allow her to reach out to the side.  07/03/22 INITIAL  4 Pt will not demonstrate any cording at end range to allow improved comfort. 07/03/22 INITIAL  5 Pt will be independent in a home exercise program for continued  strengthening and stretching.  07/03/22 INITIAL     PLAN:  PT FREQUENCY/DURATION: 2x/wk for 4 wks  PLAN FOR NEXT SESSION: Cont PROM to Rt shoulder, pulleys, ball, eventually supine scap, MLD to area of edema superior to lateral mastectomy scar   [facility]  [address], [address]  [place] [address]  [phone]   Florina Sever [place], [place] 06/20/2022, 3:56 PM   PHYSICAL THERAPY DISCHARGE SUMMARY  Visits from Start of Care: 4  Current functional level related to goals / functional outcomes: See above   Remaining deficits: See above   Education / Equipment: HEP   Patient agrees to discharge. Patient goals were not met. Patient is being discharged due to not returning since the last visit.  Florina Sever [place], [place] 01/17/24 3:17 PM

## 2022-06-21 ENCOUNTER — Ambulatory Visit: Payer: Federal, State, Local not specified - PPO

## 2022-06-21 ENCOUNTER — Telehealth: Payer: Self-pay

## 2022-06-21 NOTE — Telephone Encounter (Signed)
RN called pt to ensure she knew of continued delay in treatment due to wound healing concerns (per request of Dr. Isidore Moos). She stated that Dr. Donne Hazel had already discussed this with her and she was aware. Rn also encouraged her to reach out with any questions or concerns related to this delay (or any other need). Rn also offered emotional support and encouragement on this call.

## 2022-06-22 ENCOUNTER — Ambulatory Visit: Payer: Federal, State, Local not specified - PPO

## 2022-06-22 ENCOUNTER — Encounter: Payer: Self-pay | Admitting: *Deleted

## 2022-06-22 ENCOUNTER — Ambulatory Visit: Payer: Federal, State, Local not specified - PPO | Admitting: Physical Therapy

## 2022-06-23 ENCOUNTER — Ambulatory Visit: Payer: Federal, State, Local not specified - PPO

## 2022-06-26 ENCOUNTER — Ambulatory Visit: Payer: Federal, State, Local not specified - PPO

## 2022-06-26 ENCOUNTER — Ambulatory Visit: Payer: Federal, State, Local not specified - PPO | Admitting: Radiation Oncology

## 2022-06-27 ENCOUNTER — Ambulatory Visit: Payer: Federal, State, Local not specified - PPO

## 2022-06-27 ENCOUNTER — Ambulatory Visit: Payer: Federal, State, Local not specified - PPO | Attending: General Surgery | Admitting: Physical Therapy

## 2022-06-27 DIAGNOSIS — C50911 Malignant neoplasm of unspecified site of right female breast: Secondary | ICD-10-CM | POA: Insufficient documentation

## 2022-06-27 DIAGNOSIS — Z483 Aftercare following surgery for neoplasm: Secondary | ICD-10-CM | POA: Insufficient documentation

## 2022-06-27 DIAGNOSIS — R293 Abnormal posture: Secondary | ICD-10-CM | POA: Insufficient documentation

## 2022-06-27 DIAGNOSIS — C773 Secondary and unspecified malignant neoplasm of axilla and upper limb lymph nodes: Secondary | ICD-10-CM | POA: Insufficient documentation

## 2022-06-27 DIAGNOSIS — M25611 Stiffness of right shoulder, not elsewhere classified: Secondary | ICD-10-CM | POA: Insufficient documentation

## 2022-06-27 DIAGNOSIS — R600 Localized edema: Secondary | ICD-10-CM | POA: Insufficient documentation

## 2022-06-28 ENCOUNTER — Ambulatory Visit: Payer: Federal, State, Local not specified - PPO

## 2022-06-29 ENCOUNTER — Ambulatory Visit: Payer: Federal, State, Local not specified - PPO

## 2022-06-29 ENCOUNTER — Encounter: Payer: Federal, State, Local not specified - PPO | Admitting: Physical Therapy

## 2022-06-30 ENCOUNTER — Ambulatory Visit: Payer: Federal, State, Local not specified - PPO

## 2022-07-03 ENCOUNTER — Telehealth: Payer: Self-pay

## 2022-07-03 ENCOUNTER — Ambulatory Visit: Payer: Federal, State, Local not specified - PPO

## 2022-07-03 ENCOUNTER — Ambulatory Visit: Payer: Federal, State, Local not specified - PPO | Admitting: Radiation Oncology

## 2022-07-03 NOTE — Telephone Encounter (Signed)
Called placed to patient to make aware that radiation treatment start date would be pushed back until 07/10/22 due to skin issues. Patient voiced understanding.

## 2022-07-04 ENCOUNTER — Ambulatory Visit: Payer: Federal, State, Local not specified - PPO | Admitting: Physical Therapy

## 2022-07-04 ENCOUNTER — Ambulatory Visit: Payer: Federal, State, Local not specified - PPO

## 2022-07-05 ENCOUNTER — Ambulatory Visit: Payer: Federal, State, Local not specified - PPO

## 2022-07-05 ENCOUNTER — Ambulatory Visit: Payer: Federal, State, Local not specified - PPO | Admitting: Radiation Oncology

## 2022-07-05 NOTE — Telephone Encounter (Signed)
Called patient to ask when next follow up appointment with Dr. Donne Hazel will be, per patient appointment is on 07/13/22. Patient to follow up with me with update after visit with Dr. Donne Hazel.  Patient made aware treatment start date to be pushed back until after follow up with Dr. Donne Hazel.

## 2022-07-06 ENCOUNTER — Ambulatory Visit: Payer: Federal, State, Local not specified - PPO

## 2022-07-06 ENCOUNTER — Encounter: Payer: Self-pay | Admitting: *Deleted

## 2022-07-07 ENCOUNTER — Ambulatory Visit: Payer: Federal, State, Local not specified - PPO

## 2022-07-10 ENCOUNTER — Ambulatory Visit: Payer: Federal, State, Local not specified - PPO | Admitting: Radiation Oncology

## 2022-07-10 ENCOUNTER — Ambulatory Visit: Payer: Federal, State, Local not specified - PPO

## 2022-07-11 ENCOUNTER — Ambulatory Visit: Payer: Federal, State, Local not specified - PPO

## 2022-07-12 ENCOUNTER — Ambulatory Visit: Payer: Federal, State, Local not specified - PPO

## 2022-07-12 DIAGNOSIS — Z9011 Acquired absence of right breast and nipple: Secondary | ICD-10-CM | POA: Insufficient documentation

## 2022-07-12 DIAGNOSIS — C50811 Malignant neoplasm of overlapping sites of right female breast: Secondary | ICD-10-CM | POA: Insufficient documentation

## 2022-07-12 DIAGNOSIS — Z170 Estrogen receptor positive status [ER+]: Secondary | ICD-10-CM | POA: Insufficient documentation

## 2022-07-13 ENCOUNTER — Ambulatory Visit
Admission: RE | Admit: 2022-07-13 | Discharge: 2022-07-13 | Disposition: A | Discharge status: Home / Self Care | Payer: Federal, State, Local not specified - PPO | Source: Ambulatory Visit | Attending: Radiation Oncology | Admitting: Radiation Oncology

## 2022-07-13 ENCOUNTER — Ambulatory Visit: Payer: Federal, State, Local not specified - PPO

## 2022-07-13 ENCOUNTER — Other Ambulatory Visit: Payer: Self-pay

## 2022-07-13 DIAGNOSIS — Z170 Estrogen receptor positive status [ER+]: Secondary | ICD-10-CM | POA: Diagnosis not present

## 2022-07-13 DIAGNOSIS — Z510 Encounter for antineoplastic radiation therapy: Secondary | ICD-10-CM | POA: Diagnosis not present

## 2022-07-13 DIAGNOSIS — C50811 Malignant neoplasm of overlapping sites of right female breast: Secondary | ICD-10-CM | POA: Diagnosis not present

## 2022-07-13 DIAGNOSIS — Z9011 Acquired absence of right breast and nipple: Secondary | ICD-10-CM | POA: Diagnosis not present

## 2022-07-13 LAB — RAD ONC ARIA SESSION SUMMARY

## 2022-07-13 MED ORDER — RADIAPLEXRX EX GEL: Freq: Once | CUTANEOUS | Status: AC

## 2022-07-13 MED ORDER — ALRA NON-METALLIC DEODORANT (RAD-ONC)
1.0000 | Freq: Once | TOPICAL | Status: AC
  Administered 2022-07-13: 1 via TOPICAL

## 2022-07-13 NOTE — Progress Notes (Signed)
Pt here for patient teaching. Pt given Radiation and You booklet, skin care instructions, Alra deodorant, and Radiaplex gel. Reviewed areas of pertinence such as fatigue, hair loss, nausea and vomiting, skin changes, breast tenderness, breast swelling, cough, and shortness of breath. Pt able to give teach back of to pat skin, use unscented/gentle soap, and drink plenty of water, apply Radiaplex bid, avoid applying anything to skin within 4 hours of treatment, avoid wearing an under wire bra, and to use an electric razor if they must shave. Pt verbalizes understanding of information given and will contact nursing with any questions or concerns.     [email or web address]

## 2022-07-14 ENCOUNTER — Other Ambulatory Visit: Payer: Self-pay

## 2022-07-14 ENCOUNTER — Ambulatory Visit: Payer: Federal, State, Local not specified - PPO

## 2022-07-14 ENCOUNTER — Encounter: Payer: Self-pay | Admitting: *Deleted

## 2022-07-14 ENCOUNTER — Ambulatory Visit
Admission: RE | Admit: 2022-07-14 | Discharge: 2022-07-14 | Disposition: A | Discharge status: Home / Self Care | Payer: Federal, State, Local not specified - PPO | Source: Ambulatory Visit | Attending: Radiation Oncology | Admitting: Radiation Oncology

## 2022-07-14 DIAGNOSIS — Z170 Estrogen receptor positive status [ER+]: Secondary | ICD-10-CM | POA: Diagnosis not present

## 2022-07-14 DIAGNOSIS — Z510 Encounter for antineoplastic radiation therapy: Secondary | ICD-10-CM | POA: Diagnosis not present

## 2022-07-14 DIAGNOSIS — Z9011 Acquired absence of right breast and nipple: Secondary | ICD-10-CM | POA: Diagnosis not present

## 2022-07-14 DIAGNOSIS — C50811 Malignant neoplasm of overlapping sites of right female breast: Secondary | ICD-10-CM | POA: Diagnosis not present

## 2022-07-14 LAB — RAD ONC ARIA SESSION SUMMARY
Course Elapsed Days: 1
Plan Fractions Treated to Date: 1
Plan Fractions Treated to Date: 2
Plan Prescribed Dose Per Fraction: 2 Gy
Plan Prescribed Dose Per Fraction: 2 Gy
Plan Total Fractions Prescribed: 12
Plan Total Fractions Prescribed: 25
Plan Total Prescribed Dose: 24 Gy
Plan Total Prescribed Dose: 50 Gy
Reference Point Dosage Given to Date: 4 Gy
Reference Point Dosage Given to Date: 4 Gy
Reference Point Session Dosage Given: 2 Gy
Reference Point Session Dosage Given: 2 Gy
Session Number: 2

## 2022-07-17 ENCOUNTER — Ambulatory Visit
Admission: RE | Admit: 2022-07-17 | Discharge: 2022-07-17 | Disposition: A | Discharge status: Home / Self Care | Payer: Federal, State, Local not specified - PPO | Source: Ambulatory Visit | Attending: Radiation Oncology | Admitting: Radiation Oncology

## 2022-07-17 ENCOUNTER — Ambulatory Visit: Payer: Federal, State, Local not specified - PPO

## 2022-07-17 ENCOUNTER — Ambulatory Visit: Payer: Federal, State, Local not specified - PPO | Admitting: Radiation Oncology

## 2022-07-17 ENCOUNTER — Other Ambulatory Visit: Payer: Self-pay

## 2022-07-17 DIAGNOSIS — Z9011 Acquired absence of right breast and nipple: Secondary | ICD-10-CM | POA: Diagnosis not present

## 2022-07-17 DIAGNOSIS — Z170 Estrogen receptor positive status [ER+]: Secondary | ICD-10-CM

## 2022-07-17 DIAGNOSIS — C50811 Malignant neoplasm of overlapping sites of right female breast: Secondary | ICD-10-CM | POA: Diagnosis not present

## 2022-07-17 DIAGNOSIS — Z510 Encounter for antineoplastic radiation therapy: Secondary | ICD-10-CM | POA: Diagnosis not present

## 2022-07-17 LAB — RAD ONC ARIA SESSION SUMMARY
Course Elapsed Days: 4
Plan Fractions Treated to Date: 2
Plan Fractions Treated to Date: 3
Plan Prescribed Dose Per Fraction: 2 Gy
Plan Prescribed Dose Per Fraction: 2 Gy
Plan Total Fractions Prescribed: 13
Plan Total Fractions Prescribed: 25
Plan Total Prescribed Dose: 26 Gy
Plan Total Prescribed Dose: 50 Gy
Reference Point Dosage Given to Date: 6 Gy
Reference Point Dosage Given to Date: 6 Gy
Reference Point Session Dosage Given: 2 Gy
Reference Point Session Dosage Given: 2 Gy
Session Number: 3

## 2022-07-17 MED ORDER — RADIAPLEXRX EX GEL: Freq: Once | CUTANEOUS | Status: DC

## 2022-07-17 MED ORDER — ALRA NON-METALLIC DEODORANT (RAD-ONC): 1.0000 | Freq: Once | TOPICAL | Status: DC

## 2022-07-18 ENCOUNTER — Ambulatory Visit: Payer: Federal, State, Local not specified - PPO

## 2022-07-18 ENCOUNTER — Other Ambulatory Visit: Payer: Self-pay

## 2022-07-18 ENCOUNTER — Ambulatory Visit
Admission: RE | Admit: 2022-07-18 | Discharge: 2022-07-18 | Disposition: A | Discharge status: Home / Self Care | Payer: Federal, State, Local not specified - PPO | Source: Ambulatory Visit | Attending: Radiation Oncology | Admitting: Radiation Oncology

## 2022-07-18 DIAGNOSIS — Z510 Encounter for antineoplastic radiation therapy: Secondary | ICD-10-CM | POA: Diagnosis not present

## 2022-07-18 DIAGNOSIS — Z9011 Acquired absence of right breast and nipple: Secondary | ICD-10-CM | POA: Diagnosis not present

## 2022-07-18 DIAGNOSIS — C50811 Malignant neoplasm of overlapping sites of right female breast: Secondary | ICD-10-CM | POA: Diagnosis not present

## 2022-07-18 DIAGNOSIS — Z170 Estrogen receptor positive status [ER+]: Secondary | ICD-10-CM | POA: Diagnosis not present

## 2022-07-18 LAB — RAD ONC ARIA SESSION SUMMARY
Course Elapsed Days: 5
Plan Fractions Treated to Date: 2
Plan Fractions Treated to Date: 4
Plan Prescribed Dose Per Fraction: 2 Gy
Plan Prescribed Dose Per Fraction: 2 Gy
Plan Total Fractions Prescribed: 12
Plan Total Fractions Prescribed: 25
Plan Total Prescribed Dose: 24 Gy
Plan Total Prescribed Dose: 50 Gy
Reference Point Dosage Given to Date: 8 Gy
Reference Point Dosage Given to Date: 8 Gy
Reference Point Session Dosage Given: 2 Gy
Reference Point Session Dosage Given: 2 Gy
Session Number: 4

## 2022-07-19 ENCOUNTER — Ambulatory Visit: Payer: Federal, State, Local not specified - PPO

## 2022-07-19 ENCOUNTER — Other Ambulatory Visit: Payer: Self-pay

## 2022-07-19 ENCOUNTER — Ambulatory Visit
Admission: RE | Admit: 2022-07-19 | Discharge: 2022-07-19 | Disposition: A | Discharge status: Home / Self Care | Payer: Federal, State, Local not specified - PPO | Source: Ambulatory Visit | Attending: Radiation Oncology | Admitting: Radiation Oncology

## 2022-07-19 DIAGNOSIS — Z9011 Acquired absence of right breast and nipple: Secondary | ICD-10-CM | POA: Diagnosis not present

## 2022-07-19 DIAGNOSIS — Z170 Estrogen receptor positive status [ER+]: Secondary | ICD-10-CM | POA: Diagnosis not present

## 2022-07-19 DIAGNOSIS — C50811 Malignant neoplasm of overlapping sites of right female breast: Secondary | ICD-10-CM | POA: Diagnosis not present

## 2022-07-19 DIAGNOSIS — Z510 Encounter for antineoplastic radiation therapy: Secondary | ICD-10-CM | POA: Diagnosis not present

## 2022-07-19 LAB — RAD ONC ARIA SESSION SUMMARY
Course Elapsed Days: 6
Plan Fractions Treated to Date: 3
Plan Fractions Treated to Date: 5
Plan Prescribed Dose Per Fraction: 2 Gy
Plan Prescribed Dose Per Fraction: 2 Gy
Plan Total Fractions Prescribed: 13
Plan Total Fractions Prescribed: 25
Plan Total Prescribed Dose: 26 Gy
Plan Total Prescribed Dose: 50 Gy
Reference Point Dosage Given to Date: 10 Gy
Reference Point Dosage Given to Date: 10 Gy
Reference Point Session Dosage Given: 2 Gy
Reference Point Session Dosage Given: 2 Gy
Session Number: 5

## 2022-07-20 ENCOUNTER — Ambulatory Visit
Admission: RE | Admit: 2022-07-20 | Discharge: 2022-07-20 | Disposition: A | Discharge status: Home / Self Care | Payer: Federal, State, Local not specified - PPO | Source: Ambulatory Visit | Attending: Radiation Oncology | Admitting: Radiation Oncology

## 2022-07-20 ENCOUNTER — Ambulatory Visit: Payer: Federal, State, Local not specified - PPO

## 2022-07-20 ENCOUNTER — Other Ambulatory Visit: Payer: Self-pay

## 2022-07-20 DIAGNOSIS — Z9011 Acquired absence of right breast and nipple: Secondary | ICD-10-CM | POA: Diagnosis not present

## 2022-07-20 DIAGNOSIS — C50811 Malignant neoplasm of overlapping sites of right female breast: Secondary | ICD-10-CM | POA: Diagnosis not present

## 2022-07-20 DIAGNOSIS — Z510 Encounter for antineoplastic radiation therapy: Secondary | ICD-10-CM | POA: Diagnosis not present

## 2022-07-20 DIAGNOSIS — Z170 Estrogen receptor positive status [ER+]: Secondary | ICD-10-CM | POA: Diagnosis not present

## 2022-07-20 LAB — RAD ONC ARIA SESSION SUMMARY
Course Elapsed Days: 7
Plan Fractions Treated to Date: 3
Plan Fractions Treated to Date: 6
Plan Prescribed Dose Per Fraction: 2 Gy
Plan Prescribed Dose Per Fraction: 2 Gy
Plan Total Fractions Prescribed: 12
Plan Total Fractions Prescribed: 25
Plan Total Prescribed Dose: 24 Gy
Plan Total Prescribed Dose: 50 Gy
Reference Point Dosage Given to Date: 12 Gy
Reference Point Dosage Given to Date: 12 Gy
Reference Point Session Dosage Given: 2 Gy
Reference Point Session Dosage Given: 2 Gy
Session Number: 6

## 2022-07-20 NOTE — Progress Notes (Signed)
Patient Care Team: [name], DO as PCP - General (Family Medicine) Kathrene Alu, MD (Inactive) (Family Medicine) Nicholas Lose, MD as Consulting Physician (Hematology and Oncology) Rolm Bookbinder, MD as Consulting Physician (General Surgery) Mauro Kaufmann, RN as Oncology Nurse Navigator Rockwell Germany, RN as Oncology Nurse Navigator  DIAGNOSIS: No diagnosis found.  SUMMARY OF ONCOLOGIC HISTORY: Oncology History  Malignant neoplasm of overlapping 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, estrogen receptor positive ([place])  03/08/2022 Initial Diagnosis   Palpable lump in the right breast, mammogram and ultrasound revealed 0.8 cm and 0.8 cm and 0.5 cm masses, 10 cm suspicious calcifications, single enlarged right axillary lymph node 11:00: Grade 2 IDC with IG DCIS ER 70%, PR 90%, Ki-67 20%, HER2 1+ negative Lymph node: Positive UIQ and UOQ right breast: Grade 2 IDC with IG DCIS   03/17/2022 Cancer Staging   Staging form: Breast, AJCC 8th Edition - Clinical stage from 03/17/2022: Stage IB (cT1b, cN1, cM0, G2, ER+, PR+, HER2-) - Signed by Nicholas Lose, MD on 03/17/2022 Stage prefix: Initial diagnosis Histologic grading system: 3 grade system   03/30/2022 Genetic Testing   Negative [facility] CustomNext-Cancer +RNAinsight Panel.  Report date is 03/30/2022.   The CustomNext-Cancer+RNAinsight panel offered by [facility] includes sequencing and rearrangement analysis for the following 37 genes:  APC, ATM, BARD1, BMPR1A, BRCA1, BRCA2, BRIP1, CDH1, CDK4, CDKN2A, CHEK2, DICER1, MLH1, MSH2, MSH6, MUTYH, NBN, NF1, NTHL1, PALB2, PMS2, PTEN, RAD51C, RAD51D, RECQL, SMAD4, SMARCA4, STK11 and TP53 (sequencing and deletion/duplication); AXIN2, CTNNA1, HOXB13, MSH3, POLD1 and POLE (sequencing only); EPCAM and GREM1 (deletion/duplication only). RNA data is routinely analyzed for use in variant interpretation for all genes   05/08/2022 Surgery   Right lumpectomy: Grade 2 IDC 8 cm with intermediate  grade DCIS, margins negative, ER 70%, PR 90%, HER2 negative 1+, 1/4 lymph nodes positive with focal extracapsular extension   05/24/2022 Cancer Staging   Staging form: Breast, AJCC 8th Edition - Pathologic: Stage IB (pT3, pN1, cM0, G2, ER+, PR+, HER2-) - Signed by Nicholas Lose, MD on 05/24/2022 Histologic grading system: 3 grade system     CHIEF COMPLIANT: Follow-up to discuss mamma print  INTERVAL HISTORY: Brooke Marks is a 42 y.o. female is here because of recent diagnosis of right breast cancer. She presents to the clinic for a follow-up.     ALLERGIES:  has No Known Allergies.  MEDICATIONS:  Current Outpatient Medications  Medication Sig Dispense Refill   amLODipine (NORVASC) 10 MG tablet TAKE 1 TABLET BY MOUTH EVERY DAY 90 tablet 3   Multiple Vitamin (MULTIVITAMIN) tablet Take 1 tablet by mouth daily.     No current facility-administered medications for this visit.    PHYSICAL EXAMINATION: ECOG PERFORMANCE STATUS: {CHL ONC ECOG PS:[phone]}  There were no vitals filed for this visit. There were no vitals filed for this visit.  BREAST:*** No palpable masses or nodules in either right or left breasts. No palpable axillary supraclavicular or infraclavicular adenopathy no breast tenderness or nipple discharge. (exam performed in the presence of a chaperone)  LABORATORY DATA:  I have reviewed the data as listed    Latest Ref Rng & Units 03/30/2022    3:05 PM 04/09/2019   12:30 PM 04/04/2019    1:15 PM  CMP  Glucose 65 - 99 mg/dL  93  107   BUN 6 - 20 mg/dL  12  14   Creatinine 0.44 - 1.00 mg/dL 0.50  0.59  0.55   Sodium 134 -  144 mmol/L  141  138   Potassium 3.5 - 5.2 mmol/L  3.9  3.5   Chloride 96 - 106 mmol/L  104  103   CO2 20 - 29 mmol/L  26  23   Calcium 8.7 - 10.2 mg/dL  9.1  8.8   Total Protein 6.5 - 8.1 g/dL   8.1   Total Bilirubin 0.3 - 1.2 mg/dL   0.6   Alkaline Phos 38 - 126 U/L   52   AST 15 - 41 U/L   34   ALT 0 - 44 U/L   26     Lab Results   Component Value Date   WBC 6.9 04/09/2019   HGB 10.1 (L) 04/09/2019   HCT 30.9 (L) 04/09/2019   MCV 77 (L) 04/09/2019   PLT 445 04/09/2019   NEUTROABS 4.1 01/14/2014    ASSESSMENT & PLAN:  No problem-specific Assessment & Plan notes found for this encounter.    No orders of the defined types were placed in this encounter.  The patient has a good understanding of the overall plan. she agrees with it. she will call with any problems that may develop before the next visit here. Total time spent: 30 mins including face to face time and time spent for planning, charting and co-ordination of care   Suzzette Righter, [place] 07/20/22    I Gardiner Coins am acting as a Education administrator for [employer]  ***

## 2022-07-21 ENCOUNTER — Ambulatory Visit: Payer: Federal, State, Local not specified - PPO

## 2022-07-21 ENCOUNTER — Other Ambulatory Visit: Payer: Self-pay

## 2022-07-21 ENCOUNTER — Ambulatory Visit
Admission: RE | Admit: 2022-07-21 | Discharge: 2022-07-21 | Disposition: A | Discharge status: Home / Self Care | Payer: Federal, State, Local not specified - PPO | Source: Ambulatory Visit | Attending: Radiation Oncology | Admitting: Radiation Oncology

## 2022-07-21 DIAGNOSIS — C50911 Malignant neoplasm of unspecified site of right female breast: Secondary | ICD-10-CM | POA: Insufficient documentation

## 2022-07-21 DIAGNOSIS — Z170 Estrogen receptor positive status [ER+]: Secondary | ICD-10-CM | POA: Diagnosis not present

## 2022-07-21 DIAGNOSIS — C50811 Malignant neoplasm of overlapping sites of right female breast: Secondary | ICD-10-CM | POA: Diagnosis not present

## 2022-07-21 DIAGNOSIS — Z510 Encounter for antineoplastic radiation therapy: Secondary | ICD-10-CM | POA: Diagnosis not present

## 2022-07-21 LAB — RAD ONC ARIA SESSION SUMMARY
Course Elapsed Days: 8
Plan Fractions Treated to Date: 4
Plan Fractions Treated to Date: 7
Plan Prescribed Dose Per Fraction: 2 Gy
Plan Prescribed Dose Per Fraction: 2 Gy
Plan Total Fractions Prescribed: 13
Plan Total Fractions Prescribed: 25
Plan Total Prescribed Dose: 26 Gy
Plan Total Prescribed Dose: 50 Gy
Reference Point Dosage Given to Date: 14 Gy
Reference Point Dosage Given to Date: 14 Gy
Reference Point Session Dosage Given: 2 Gy
Reference Point Session Dosage Given: 2 Gy
Session Number: 7

## 2022-07-24 ENCOUNTER — Other Ambulatory Visit: Payer: Self-pay

## 2022-07-24 ENCOUNTER — Inpatient Hospital Stay (HOSPITAL_BASED_OUTPATIENT_CLINIC_OR_DEPARTMENT_OTHER): Payer: Federal, State, Local not specified - PPO | Admitting: Hematology and Oncology

## 2022-07-24 ENCOUNTER — Ambulatory Visit: Payer: Federal, State, Local not specified - PPO

## 2022-07-24 ENCOUNTER — Ambulatory Visit
Admission: RE | Admit: 2022-07-24 | Discharge: 2022-07-24 | Disposition: A | Discharge status: Home / Self Care | Payer: Federal, State, Local not specified - PPO | Source: Ambulatory Visit | Attending: Radiation Oncology | Admitting: Radiation Oncology

## 2022-07-24 VITALS — BP 155/96 | HR 116 | Temp 97.3°F | Resp 18 | Wt 181.6 lb

## 2022-07-24 DIAGNOSIS — C50811 Malignant neoplasm of overlapping sites of right female breast: Secondary | ICD-10-CM | POA: Insufficient documentation

## 2022-07-24 DIAGNOSIS — Z79899 Other long term (current) drug therapy: Secondary | ICD-10-CM | POA: Insufficient documentation

## 2022-07-24 DIAGNOSIS — Z510 Encounter for antineoplastic radiation therapy: Secondary | ICD-10-CM | POA: Diagnosis not present

## 2022-07-24 DIAGNOSIS — Z79811 Long term (current) use of aromatase inhibitors: Secondary | ICD-10-CM | POA: Insufficient documentation

## 2022-07-24 DIAGNOSIS — Z170 Estrogen receptor positive status [ER+]: Secondary | ICD-10-CM | POA: Insufficient documentation

## 2022-07-24 DIAGNOSIS — C50911 Malignant neoplasm of unspecified site of right female breast: Secondary | ICD-10-CM | POA: Diagnosis not present

## 2022-07-24 LAB — RAD ONC ARIA SESSION SUMMARY
Course Elapsed Days: 11
Plan Fractions Treated to Date: 4
Plan Fractions Treated to Date: 8
Plan Prescribed Dose Per Fraction: 2 Gy
Plan Prescribed Dose Per Fraction: 2 Gy
Plan Total Fractions Prescribed: 12
Plan Total Fractions Prescribed: 25
Plan Total Prescribed Dose: 24 Gy
Plan Total Prescribed Dose: 50 Gy
Reference Point Dosage Given to Date: 16 Gy
Reference Point Dosage Given to Date: 16 Gy
Reference Point Session Dosage Given: 2 Gy
Reference Point Session Dosage Given: 2 Gy
Session Number: 8

## 2022-07-24 MED ORDER — ANASTROZOLE 1 MG PO TABS: 1.0000 mg | ORAL_TABLET | Freq: Every day | ORAL | 3 refills | Status: DC

## 2022-07-24 NOTE — Assessment & Plan Note (Addendum)
05/08/2022:Right lumpectomy: Grade 2 IDC 8 cm with intermediate grade DCIS, margins negative, ER 70%, PR 90%, HER2 negative 1+, 1/4 lymph nodes positive with focal extracapsular extension   Treatment plan: MammaPrint testing 05/30/2022: Ultralow risk Adjuvant radiation therapy 07/14/2022-08/24/2022 Adjuvant antiestrogen therapy: Ovarian function suppression plus AI Genetic testing: Negative ------------------------------------------------------------------------------------------------------------------------------------- Antiestrogen therapy counseling: We discussed different options and I recommended that she undergo ovarian function suppression with aromatase inhibitor therapy.  She will be set up for Zoladex injections starting on 08/24/2022.  Return to clinic in 3 months for survivorship care plan visit.

## 2022-07-25 ENCOUNTER — Ambulatory Visit
Admission: RE | Admit: 2022-07-25 | Discharge: 2022-07-25 | Disposition: A | Discharge status: Home / Self Care | Payer: Federal, State, Local not specified - PPO | Source: Ambulatory Visit | Attending: Radiation Oncology | Admitting: Radiation Oncology

## 2022-07-25 ENCOUNTER — Other Ambulatory Visit: Payer: Self-pay

## 2022-07-25 ENCOUNTER — Ambulatory Visit: Payer: Federal, State, Local not specified - PPO

## 2022-07-25 DIAGNOSIS — C50911 Malignant neoplasm of unspecified site of right female breast: Secondary | ICD-10-CM | POA: Diagnosis not present

## 2022-07-25 DIAGNOSIS — Z170 Estrogen receptor positive status [ER+]: Secondary | ICD-10-CM | POA: Diagnosis not present

## 2022-07-25 DIAGNOSIS — C50811 Malignant neoplasm of overlapping sites of right female breast: Secondary | ICD-10-CM | POA: Diagnosis not present

## 2022-07-25 DIAGNOSIS — Z510 Encounter for antineoplastic radiation therapy: Secondary | ICD-10-CM | POA: Diagnosis not present

## 2022-07-25 LAB — RAD ONC ARIA SESSION SUMMARY
Course Elapsed Days: 12
Plan Fractions Treated to Date: 5
Plan Fractions Treated to Date: 9
Plan Prescribed Dose Per Fraction: 2 Gy
Plan Prescribed Dose Per Fraction: 2 Gy
Plan Total Fractions Prescribed: 13
Plan Total Fractions Prescribed: 25
Plan Total Prescribed Dose: 26 Gy
Plan Total Prescribed Dose: 50 Gy
Reference Point Dosage Given to Date: 18 Gy
Reference Point Dosage Given to Date: 18 Gy
Reference Point Session Dosage Given: 2 Gy
Reference Point Session Dosage Given: 2 Gy
Session Number: 9

## 2022-07-26 ENCOUNTER — Ambulatory Visit
Admission: RE | Admit: 2022-07-26 | Discharge: 2022-07-26 | Disposition: A | Discharge status: Home / Self Care | Payer: Federal, State, Local not specified - PPO | Source: Ambulatory Visit | Attending: Radiation Oncology | Admitting: Radiation Oncology

## 2022-07-26 ENCOUNTER — Ambulatory Visit: Payer: Federal, State, Local not specified - PPO

## 2022-07-26 ENCOUNTER — Other Ambulatory Visit: Payer: Self-pay

## 2022-07-26 DIAGNOSIS — C50811 Malignant neoplasm of overlapping sites of right female breast: Secondary | ICD-10-CM | POA: Diagnosis not present

## 2022-07-26 DIAGNOSIS — C50911 Malignant neoplasm of unspecified site of right female breast: Secondary | ICD-10-CM | POA: Diagnosis not present

## 2022-07-26 DIAGNOSIS — Z170 Estrogen receptor positive status [ER+]: Secondary | ICD-10-CM | POA: Diagnosis not present

## 2022-07-26 DIAGNOSIS — Z510 Encounter for antineoplastic radiation therapy: Secondary | ICD-10-CM | POA: Diagnosis not present

## 2022-07-26 LAB — RAD ONC ARIA SESSION SUMMARY
Course Elapsed Days: 13
Plan Fractions Treated to Date: 10
Plan Fractions Treated to Date: 5
Plan Prescribed Dose Per Fraction: 2 Gy
Plan Prescribed Dose Per Fraction: 2 Gy
Plan Total Fractions Prescribed: 12
Plan Total Fractions Prescribed: 25
Plan Total Prescribed Dose: 24 Gy
Plan Total Prescribed Dose: 50 Gy
Reference Point Dosage Given to Date: 20 Gy
Reference Point Dosage Given to Date: 20 Gy
Reference Point Session Dosage Given: 2 Gy
Reference Point Session Dosage Given: 2 Gy
Session Number: 10

## 2022-07-27 ENCOUNTER — Ambulatory Visit
Admission: RE | Admit: 2022-07-27 | Discharge: 2022-07-27 | Disposition: A | Discharge status: Home / Self Care | Payer: Federal, State, Local not specified - PPO | Source: Ambulatory Visit | Attending: Radiation Oncology | Admitting: Radiation Oncology

## 2022-07-27 ENCOUNTER — Other Ambulatory Visit: Payer: Self-pay

## 2022-07-27 ENCOUNTER — Ambulatory Visit: Payer: Federal, State, Local not specified - PPO

## 2022-07-27 DIAGNOSIS — Z510 Encounter for antineoplastic radiation therapy: Secondary | ICD-10-CM | POA: Diagnosis not present

## 2022-07-27 DIAGNOSIS — Z170 Estrogen receptor positive status [ER+]: Secondary | ICD-10-CM | POA: Diagnosis not present

## 2022-07-27 DIAGNOSIS — C50911 Malignant neoplasm of unspecified site of right female breast: Secondary | ICD-10-CM | POA: Diagnosis not present

## 2022-07-27 DIAGNOSIS — C50811 Malignant neoplasm of overlapping sites of right female breast: Secondary | ICD-10-CM | POA: Diagnosis not present

## 2022-07-27 LAB — RAD ONC ARIA SESSION SUMMARY
Course Elapsed Days: 14
Plan Fractions Treated to Date: 11
Plan Fractions Treated to Date: 6
Plan Prescribed Dose Per Fraction: 2 Gy
Plan Prescribed Dose Per Fraction: 2 Gy
Plan Total Fractions Prescribed: 13
Plan Total Fractions Prescribed: 25
Plan Total Prescribed Dose: 26 Gy
Plan Total Prescribed Dose: 50 Gy
Reference Point Dosage Given to Date: 22 Gy
Reference Point Dosage Given to Date: 22 Gy
Reference Point Session Dosage Given: 2 Gy
Reference Point Session Dosage Given: 2 Gy
Session Number: 11

## 2022-07-28 ENCOUNTER — Ambulatory Visit: Payer: Federal, State, Local not specified - PPO

## 2022-07-28 ENCOUNTER — Ambulatory Visit
Admission: RE | Admit: 2022-07-28 | Discharge: 2022-07-28 | Disposition: A | Discharge status: Home / Self Care | Payer: Federal, State, Local not specified - PPO | Source: Ambulatory Visit | Attending: Radiation Oncology | Admitting: Radiation Oncology

## 2022-07-28 ENCOUNTER — Other Ambulatory Visit: Payer: Self-pay

## 2022-07-28 DIAGNOSIS — C50911 Malignant neoplasm of unspecified site of right female breast: Secondary | ICD-10-CM | POA: Diagnosis not present

## 2022-07-28 DIAGNOSIS — C50811 Malignant neoplasm of overlapping sites of right female breast: Secondary | ICD-10-CM | POA: Diagnosis not present

## 2022-07-28 DIAGNOSIS — Z510 Encounter for antineoplastic radiation therapy: Secondary | ICD-10-CM | POA: Diagnosis not present

## 2022-07-28 DIAGNOSIS — Z170 Estrogen receptor positive status [ER+]: Secondary | ICD-10-CM | POA: Diagnosis not present

## 2022-07-28 LAB — RAD ONC ARIA SESSION SUMMARY
Course Elapsed Days: 15
Plan Fractions Treated to Date: 12
Plan Fractions Treated to Date: 6
Plan Prescribed Dose Per Fraction: 2 Gy
Plan Prescribed Dose Per Fraction: 2 Gy
Plan Total Fractions Prescribed: 12
Plan Total Fractions Prescribed: 25
Plan Total Prescribed Dose: 24 Gy
Plan Total Prescribed Dose: 50 Gy
Reference Point Dosage Given to Date: 24 Gy
Reference Point Dosage Given to Date: 24 Gy
Reference Point Session Dosage Given: 2 Gy
Reference Point Session Dosage Given: 2 Gy
Session Number: 12

## 2022-07-31 ENCOUNTER — Other Ambulatory Visit: Payer: Self-pay

## 2022-07-31 ENCOUNTER — Ambulatory Visit: Payer: Federal, State, Local not specified - PPO

## 2022-07-31 ENCOUNTER — Ambulatory Visit
Admission: RE | Admit: 2022-07-31 | Discharge: 2022-07-31 | Disposition: A | Discharge status: Home / Self Care | Payer: Federal, State, Local not specified - PPO | Source: Ambulatory Visit | Attending: Radiation Oncology | Admitting: Radiation Oncology

## 2022-07-31 DIAGNOSIS — Z170 Estrogen receptor positive status [ER+]: Secondary | ICD-10-CM | POA: Diagnosis not present

## 2022-07-31 DIAGNOSIS — C50811 Malignant neoplasm of overlapping sites of right female breast: Secondary | ICD-10-CM | POA: Diagnosis not present

## 2022-07-31 DIAGNOSIS — Z510 Encounter for antineoplastic radiation therapy: Secondary | ICD-10-CM | POA: Diagnosis not present

## 2022-07-31 DIAGNOSIS — C50911 Malignant neoplasm of unspecified site of right female breast: Secondary | ICD-10-CM | POA: Diagnosis not present

## 2022-07-31 LAB — RAD ONC ARIA SESSION SUMMARY
Course Elapsed Days: 18
Plan Fractions Treated to Date: 13
Plan Fractions Treated to Date: 7
Plan Prescribed Dose Per Fraction: 2 Gy
Plan Prescribed Dose Per Fraction: 2 Gy
Plan Total Fractions Prescribed: 13
Plan Total Fractions Prescribed: 25
Plan Total Prescribed Dose: 26 Gy
Plan Total Prescribed Dose: 50 Gy
Reference Point Dosage Given to Date: 26 Gy
Reference Point Dosage Given to Date: 26 Gy
Reference Point Session Dosage Given: 2 Gy
Reference Point Session Dosage Given: 2 Gy
Session Number: 13

## 2022-08-01 ENCOUNTER — Other Ambulatory Visit: Payer: Self-pay

## 2022-08-01 ENCOUNTER — Ambulatory Visit
Admission: RE | Admit: 2022-08-01 | Discharge: 2022-08-01 | Disposition: A | Discharge status: Home / Self Care | Payer: Federal, State, Local not specified - PPO | Source: Ambulatory Visit | Attending: Radiation Oncology | Admitting: Radiation Oncology

## 2022-08-01 DIAGNOSIS — Z510 Encounter for antineoplastic radiation therapy: Secondary | ICD-10-CM | POA: Diagnosis not present

## 2022-08-01 DIAGNOSIS — C50911 Malignant neoplasm of unspecified site of right female breast: Secondary | ICD-10-CM | POA: Diagnosis not present

## 2022-08-01 DIAGNOSIS — Z170 Estrogen receptor positive status [ER+]: Secondary | ICD-10-CM | POA: Diagnosis not present

## 2022-08-01 DIAGNOSIS — C50811 Malignant neoplasm of overlapping sites of right female breast: Secondary | ICD-10-CM | POA: Diagnosis not present

## 2022-08-01 LAB — RAD ONC ARIA SESSION SUMMARY
Course Elapsed Days: 19
Plan Fractions Treated to Date: 14
Plan Fractions Treated to Date: 7
Plan Prescribed Dose Per Fraction: 2 Gy
Plan Prescribed Dose Per Fraction: 2 Gy
Plan Total Fractions Prescribed: 12
Plan Total Fractions Prescribed: 25
Plan Total Prescribed Dose: 24 Gy
Plan Total Prescribed Dose: 50 Gy
Reference Point Dosage Given to Date: 28 Gy
Reference Point Dosage Given to Date: 28 Gy
Reference Point Session Dosage Given: 2 Gy
Reference Point Session Dosage Given: 2 Gy
Session Number: 14

## 2022-08-02 ENCOUNTER — Other Ambulatory Visit: Payer: Self-pay

## 2022-08-02 ENCOUNTER — Ambulatory Visit
Admission: RE | Admit: 2022-08-02 | Discharge: 2022-08-02 | Disposition: A | Discharge status: Home / Self Care | Payer: Federal, State, Local not specified - PPO | Source: Ambulatory Visit | Attending: Radiation Oncology | Admitting: Radiation Oncology

## 2022-08-02 DIAGNOSIS — Z510 Encounter for antineoplastic radiation therapy: Secondary | ICD-10-CM | POA: Diagnosis not present

## 2022-08-02 DIAGNOSIS — C50911 Malignant neoplasm of unspecified site of right female breast: Secondary | ICD-10-CM | POA: Diagnosis not present

## 2022-08-02 DIAGNOSIS — Z170 Estrogen receptor positive status [ER+]: Secondary | ICD-10-CM | POA: Diagnosis not present

## 2022-08-02 DIAGNOSIS — C50811 Malignant neoplasm of overlapping sites of right female breast: Secondary | ICD-10-CM | POA: Diagnosis not present

## 2022-08-02 LAB — RAD ONC ARIA SESSION SUMMARY
Course Elapsed Days: 20
Plan Fractions Treated to Date: 15
Plan Fractions Treated to Date: 8
Plan Prescribed Dose Per Fraction: 2 Gy
Plan Prescribed Dose Per Fraction: 2 Gy
Plan Total Fractions Prescribed: 13
Plan Total Fractions Prescribed: 25
Plan Total Prescribed Dose: 26 Gy
Plan Total Prescribed Dose: 50 Gy
Reference Point Dosage Given to Date: 30 Gy
Reference Point Dosage Given to Date: 30 Gy
Reference Point Session Dosage Given: 2 Gy
Reference Point Session Dosage Given: 2 Gy
Session Number: 15

## 2022-08-03 ENCOUNTER — Ambulatory Visit
Admission: RE | Admit: 2022-08-03 | Discharge: 2022-08-03 | Disposition: A | Discharge status: Home / Self Care | Payer: Federal, State, Local not specified - PPO | Source: Ambulatory Visit | Attending: Radiation Oncology | Admitting: Radiation Oncology

## 2022-08-03 ENCOUNTER — Other Ambulatory Visit: Payer: Self-pay

## 2022-08-03 DIAGNOSIS — Z170 Estrogen receptor positive status [ER+]: Secondary | ICD-10-CM | POA: Diagnosis not present

## 2022-08-03 DIAGNOSIS — C50911 Malignant neoplasm of unspecified site of right female breast: Secondary | ICD-10-CM | POA: Diagnosis not present

## 2022-08-03 DIAGNOSIS — Z510 Encounter for antineoplastic radiation therapy: Secondary | ICD-10-CM | POA: Diagnosis not present

## 2022-08-03 DIAGNOSIS — C50811 Malignant neoplasm of overlapping sites of right female breast: Secondary | ICD-10-CM | POA: Diagnosis not present

## 2022-08-03 LAB — RAD ONC ARIA SESSION SUMMARY
Course Elapsed Days: 21
Plan Fractions Treated to Date: 16
Plan Fractions Treated to Date: 8
Plan Prescribed Dose Per Fraction: 2 Gy
Plan Prescribed Dose Per Fraction: 2 Gy
Plan Total Fractions Prescribed: 12
Plan Total Fractions Prescribed: 25
Plan Total Prescribed Dose: 24 Gy
Plan Total Prescribed Dose: 50 Gy
Reference Point Dosage Given to Date: 32 Gy
Reference Point Dosage Given to Date: 32 Gy
Reference Point Session Dosage Given: 2 Gy
Reference Point Session Dosage Given: 2 Gy
Session Number: 16

## 2022-08-04 ENCOUNTER — Ambulatory Visit: Payer: Federal, State, Local not specified - PPO

## 2022-08-04 ENCOUNTER — Other Ambulatory Visit: Payer: Self-pay

## 2022-08-04 ENCOUNTER — Ambulatory Visit
Admission: RE | Admit: 2022-08-04 | Discharge: 2022-08-04 | Disposition: A | Discharge status: Home / Self Care | Payer: Federal, State, Local not specified - PPO | Source: Ambulatory Visit | Attending: Radiation Oncology | Admitting: Radiation Oncology

## 2022-08-04 DIAGNOSIS — C50911 Malignant neoplasm of unspecified site of right female breast: Secondary | ICD-10-CM | POA: Diagnosis not present

## 2022-08-04 LAB — RAD ONC ARIA SESSION SUMMARY
Course Elapsed Days: 22
Plan Fractions Treated to Date: 17
Plan Fractions Treated to Date: 9
Plan Prescribed Dose Per Fraction: 2 Gy
Plan Prescribed Dose Per Fraction: 2 Gy
Plan Total Fractions Prescribed: 13
Plan Total Fractions Prescribed: 25
Plan Total Prescribed Dose: 26 Gy
Plan Total Prescribed Dose: 50 Gy
Reference Point Dosage Given to Date: 34 Gy
Reference Point Dosage Given to Date: 34 Gy
Reference Point Session Dosage Given: 2 Gy
Reference Point Session Dosage Given: 2 Gy
Session Number: 17

## 2022-08-07 ENCOUNTER — Ambulatory Visit
Admission: RE | Admit: 2022-08-07 | Discharge: 2022-08-07 | Disposition: A | Discharge status: Home / Self Care | Payer: Federal, State, Local not specified - PPO | Source: Ambulatory Visit | Attending: Radiation Oncology | Admitting: Radiation Oncology

## 2022-08-07 ENCOUNTER — Ambulatory Visit: Payer: Federal, State, Local not specified - PPO | Admitting: Radiation Oncology

## 2022-08-07 ENCOUNTER — Other Ambulatory Visit: Payer: Self-pay

## 2022-08-07 DIAGNOSIS — C50911 Malignant neoplasm of unspecified site of right female breast: Secondary | ICD-10-CM | POA: Diagnosis not present

## 2022-08-07 DIAGNOSIS — Z170 Estrogen receptor positive status [ER+]: Secondary | ICD-10-CM | POA: Diagnosis not present

## 2022-08-07 DIAGNOSIS — C50811 Malignant neoplasm of overlapping sites of right female breast: Secondary | ICD-10-CM | POA: Diagnosis not present

## 2022-08-07 DIAGNOSIS — Z510 Encounter for antineoplastic radiation therapy: Secondary | ICD-10-CM | POA: Diagnosis not present

## 2022-08-07 LAB — RAD ONC ARIA SESSION SUMMARY
Course Elapsed Days: 25
Plan Fractions Treated to Date: 18
Plan Fractions Treated to Date: 9
Plan Prescribed Dose Per Fraction: 2 Gy
Plan Prescribed Dose Per Fraction: 2 Gy
Plan Total Fractions Prescribed: 12
Plan Total Fractions Prescribed: 25
Plan Total Prescribed Dose: 24 Gy
Plan Total Prescribed Dose: 50 Gy
Reference Point Dosage Given to Date: 36 Gy
Reference Point Dosage Given to Date: 36 Gy
Reference Point Session Dosage Given: 2 Gy
Reference Point Session Dosage Given: 2 Gy
Session Number: 18

## 2022-08-08 ENCOUNTER — Ambulatory Visit
Admission: RE | Admit: 2022-08-08 | Discharge: 2022-08-08 | Disposition: A | Discharge status: Home / Self Care | Payer: Federal, State, Local not specified - PPO | Source: Ambulatory Visit | Attending: Radiation Oncology | Admitting: Radiation Oncology

## 2022-08-08 ENCOUNTER — Other Ambulatory Visit: Payer: Self-pay

## 2022-08-08 DIAGNOSIS — C50911 Malignant neoplasm of unspecified site of right female breast: Secondary | ICD-10-CM | POA: Diagnosis not present

## 2022-08-08 DIAGNOSIS — Z170 Estrogen receptor positive status [ER+]: Secondary | ICD-10-CM | POA: Diagnosis not present

## 2022-08-08 DIAGNOSIS — C50811 Malignant neoplasm of overlapping sites of right female breast: Secondary | ICD-10-CM | POA: Diagnosis not present

## 2022-08-08 DIAGNOSIS — Z510 Encounter for antineoplastic radiation therapy: Secondary | ICD-10-CM | POA: Diagnosis not present

## 2022-08-08 LAB — RAD ONC ARIA SESSION SUMMARY
Course Elapsed Days: 26
Plan Fractions Treated to Date: 10
Plan Fractions Treated to Date: 19
Plan Prescribed Dose Per Fraction: 2 Gy
Plan Prescribed Dose Per Fraction: 2 Gy
Plan Total Fractions Prescribed: 13
Plan Total Fractions Prescribed: 25
Plan Total Prescribed Dose: 26 Gy
Plan Total Prescribed Dose: 50 Gy
Reference Point Dosage Given to Date: 38 Gy
Reference Point Dosage Given to Date: 38 Gy
Reference Point Session Dosage Given: 2 Gy
Reference Point Session Dosage Given: 2 Gy
Session Number: 19

## 2022-08-09 ENCOUNTER — Other Ambulatory Visit: Payer: Self-pay

## 2022-08-09 ENCOUNTER — Ambulatory Visit: Payer: Federal, State, Local not specified - PPO

## 2022-08-09 ENCOUNTER — Ambulatory Visit
Admission: RE | Admit: 2022-08-09 | Discharge: 2022-08-09 | Disposition: A | Discharge status: Home / Self Care | Payer: Federal, State, Local not specified - PPO | Source: Ambulatory Visit | Attending: Radiation Oncology | Admitting: Radiation Oncology

## 2022-08-09 DIAGNOSIS — Z510 Encounter for antineoplastic radiation therapy: Secondary | ICD-10-CM | POA: Diagnosis not present

## 2022-08-09 DIAGNOSIS — C50811 Malignant neoplasm of overlapping sites of right female breast: Secondary | ICD-10-CM | POA: Diagnosis not present

## 2022-08-09 DIAGNOSIS — Z170 Estrogen receptor positive status [ER+]: Secondary | ICD-10-CM | POA: Diagnosis not present

## 2022-08-09 DIAGNOSIS — C50911 Malignant neoplasm of unspecified site of right female breast: Secondary | ICD-10-CM | POA: Diagnosis not present

## 2022-08-09 LAB — RAD ONC ARIA SESSION SUMMARY
Course Elapsed Days: 27
Plan Fractions Treated to Date: 10
Plan Fractions Treated to Date: 20
Plan Prescribed Dose Per Fraction: 2 Gy
Plan Prescribed Dose Per Fraction: 2 Gy
Plan Total Fractions Prescribed: 12
Plan Total Fractions Prescribed: 25
Plan Total Prescribed Dose: 24 Gy
Plan Total Prescribed Dose: 50 Gy
Reference Point Dosage Given to Date: 40 Gy
Reference Point Dosage Given to Date: 40 Gy
Reference Point Session Dosage Given: 2 Gy
Reference Point Session Dosage Given: 2 Gy
Session Number: 20

## 2022-08-10 ENCOUNTER — Other Ambulatory Visit: Payer: Self-pay

## 2022-08-10 ENCOUNTER — Ambulatory Visit
Admission: RE | Admit: 2022-08-10 | Discharge: 2022-08-10 | Disposition: A | Discharge status: Home / Self Care | Payer: Federal, State, Local not specified - PPO | Source: Ambulatory Visit | Attending: Radiation Oncology | Admitting: Radiation Oncology

## 2022-08-10 ENCOUNTER — Ambulatory Visit: Payer: Federal, State, Local not specified - PPO

## 2022-08-10 DIAGNOSIS — C50911 Malignant neoplasm of unspecified site of right female breast: Secondary | ICD-10-CM | POA: Diagnosis not present

## 2022-08-10 DIAGNOSIS — C50811 Malignant neoplasm of overlapping sites of right female breast: Secondary | ICD-10-CM | POA: Diagnosis not present

## 2022-08-10 DIAGNOSIS — Z170 Estrogen receptor positive status [ER+]: Secondary | ICD-10-CM | POA: Diagnosis not present

## 2022-08-10 DIAGNOSIS — Z510 Encounter for antineoplastic radiation therapy: Secondary | ICD-10-CM | POA: Diagnosis not present

## 2022-08-10 LAB — RAD ONC ARIA SESSION SUMMARY
Course Elapsed Days: 28
Plan Fractions Treated to Date: 11
Plan Fractions Treated to Date: 21
Plan Prescribed Dose Per Fraction: 2 Gy
Plan Prescribed Dose Per Fraction: 2 Gy
Plan Total Fractions Prescribed: 13
Plan Total Fractions Prescribed: 25
Plan Total Prescribed Dose: 26 Gy
Plan Total Prescribed Dose: 50 Gy
Reference Point Dosage Given to Date: 42 Gy
Reference Point Dosage Given to Date: 42 Gy
Reference Point Session Dosage Given: 2 Gy
Reference Point Session Dosage Given: 2 Gy
Session Number: 21

## 2022-08-11 ENCOUNTER — Ambulatory Visit
Admission: RE | Admit: 2022-08-11 | Discharge: 2022-08-11 | Disposition: A | Discharge status: Home / Self Care | Payer: Federal, State, Local not specified - PPO | Source: Ambulatory Visit | Attending: Radiation Oncology | Admitting: Radiation Oncology

## 2022-08-11 ENCOUNTER — Other Ambulatory Visit: Payer: Self-pay

## 2022-08-11 ENCOUNTER — Ambulatory Visit: Payer: Federal, State, Local not specified - PPO

## 2022-08-11 DIAGNOSIS — Z170 Estrogen receptor positive status [ER+]: Secondary | ICD-10-CM | POA: Diagnosis not present

## 2022-08-11 DIAGNOSIS — C50911 Malignant neoplasm of unspecified site of right female breast: Secondary | ICD-10-CM | POA: Diagnosis not present

## 2022-08-11 DIAGNOSIS — C50811 Malignant neoplasm of overlapping sites of right female breast: Secondary | ICD-10-CM | POA: Diagnosis not present

## 2022-08-11 DIAGNOSIS — Z510 Encounter for antineoplastic radiation therapy: Secondary | ICD-10-CM | POA: Diagnosis not present

## 2022-08-11 LAB — RAD ONC ARIA SESSION SUMMARY
Course Elapsed Days: 29
Plan Fractions Treated to Date: 11
Plan Fractions Treated to Date: 22
Plan Prescribed Dose Per Fraction: 2 Gy
Plan Prescribed Dose Per Fraction: 2 Gy
Plan Total Fractions Prescribed: 12
Plan Total Fractions Prescribed: 25
Plan Total Prescribed Dose: 24 Gy
Plan Total Prescribed Dose: 50 Gy
Reference Point Dosage Given to Date: 44 Gy
Reference Point Dosage Given to Date: 44 Gy
Reference Point Session Dosage Given: 2 Gy
Reference Point Session Dosage Given: 2 Gy
Session Number: 22

## 2022-08-14 ENCOUNTER — Ambulatory Visit: Payer: Federal, State, Local not specified - PPO

## 2022-08-14 ENCOUNTER — Ambulatory Visit
Admission: RE | Admit: 2022-08-14 | Discharge: 2022-08-14 | Disposition: A | Discharge status: Home / Self Care | Payer: Federal, State, Local not specified - PPO | Source: Ambulatory Visit | Attending: Radiation Oncology | Admitting: Radiation Oncology

## 2022-08-14 ENCOUNTER — Encounter: Payer: Self-pay | Admitting: *Deleted

## 2022-08-14 ENCOUNTER — Other Ambulatory Visit: Payer: Self-pay

## 2022-08-14 DIAGNOSIS — C50911 Malignant neoplasm of unspecified site of right female breast: Secondary | ICD-10-CM | POA: Diagnosis not present

## 2022-08-14 DIAGNOSIS — Z170 Estrogen receptor positive status [ER+]: Secondary | ICD-10-CM

## 2022-08-14 DIAGNOSIS — Z510 Encounter for antineoplastic radiation therapy: Secondary | ICD-10-CM | POA: Diagnosis not present

## 2022-08-14 DIAGNOSIS — C50811 Malignant neoplasm of overlapping sites of right female breast: Secondary | ICD-10-CM | POA: Diagnosis not present

## 2022-08-14 LAB — RAD ONC ARIA SESSION SUMMARY
Course Elapsed Days: 32
Plan Fractions Treated to Date: 12
Plan Fractions Treated to Date: 23
Plan Prescribed Dose Per Fraction: 2 Gy
Plan Prescribed Dose Per Fraction: 2 Gy
Plan Total Fractions Prescribed: 13
Plan Total Fractions Prescribed: 25
Plan Total Prescribed Dose: 26 Gy
Plan Total Prescribed Dose: 50 Gy
Reference Point Dosage Given to Date: 46 Gy
Reference Point Dosage Given to Date: 46 Gy
Reference Point Session Dosage Given: 2 Gy
Reference Point Session Dosage Given: 2 Gy
Session Number: 23

## 2022-08-15 ENCOUNTER — Other Ambulatory Visit: Payer: Self-pay

## 2022-08-15 ENCOUNTER — Ambulatory Visit
Admission: RE | Admit: 2022-08-15 | Discharge: 2022-08-15 | Disposition: A | Discharge status: Home / Self Care | Payer: Federal, State, Local not specified - PPO | Source: Ambulatory Visit | Attending: Radiation Oncology | Admitting: Radiation Oncology

## 2022-08-15 ENCOUNTER — Ambulatory Visit: Payer: Federal, State, Local not specified - PPO

## 2022-08-15 DIAGNOSIS — Z510 Encounter for antineoplastic radiation therapy: Secondary | ICD-10-CM | POA: Diagnosis not present

## 2022-08-15 DIAGNOSIS — C50911 Malignant neoplasm of unspecified site of right female breast: Secondary | ICD-10-CM | POA: Diagnosis not present

## 2022-08-15 DIAGNOSIS — Z170 Estrogen receptor positive status [ER+]: Secondary | ICD-10-CM | POA: Diagnosis not present

## 2022-08-15 DIAGNOSIS — C50811 Malignant neoplasm of overlapping sites of right female breast: Secondary | ICD-10-CM | POA: Diagnosis not present

## 2022-08-15 LAB — RAD ONC ARIA SESSION SUMMARY
Course Elapsed Days: 33
Plan Fractions Treated to Date: 12
Plan Fractions Treated to Date: 24
Plan Prescribed Dose Per Fraction: 2 Gy
Plan Prescribed Dose Per Fraction: 2 Gy
Plan Total Fractions Prescribed: 12
Plan Total Fractions Prescribed: 25
Plan Total Prescribed Dose: 24 Gy
Plan Total Prescribed Dose: 50 Gy
Reference Point Dosage Given to Date: 48 Gy
Reference Point Dosage Given to Date: 48 Gy
Reference Point Session Dosage Given: 2 Gy
Reference Point Session Dosage Given: 2 Gy
Session Number: 24

## 2022-08-16 ENCOUNTER — Other Ambulatory Visit: Payer: Self-pay

## 2022-08-16 ENCOUNTER — Ambulatory Visit
Admission: RE | Admit: 2022-08-16 | Discharge: 2022-08-16 | Disposition: A | Discharge status: Home / Self Care | Payer: Federal, State, Local not specified - PPO | Source: Ambulatory Visit | Attending: Radiation Oncology | Admitting: Radiation Oncology

## 2022-08-16 DIAGNOSIS — Z510 Encounter for antineoplastic radiation therapy: Secondary | ICD-10-CM | POA: Diagnosis not present

## 2022-08-16 DIAGNOSIS — C50811 Malignant neoplasm of overlapping sites of right female breast: Secondary | ICD-10-CM | POA: Diagnosis not present

## 2022-08-16 DIAGNOSIS — C50911 Malignant neoplasm of unspecified site of right female breast: Secondary | ICD-10-CM | POA: Diagnosis not present

## 2022-08-16 DIAGNOSIS — Z170 Estrogen receptor positive status [ER+]: Secondary | ICD-10-CM | POA: Diagnosis not present

## 2022-08-16 LAB — RAD ONC ARIA SESSION SUMMARY
Course Elapsed Days: 34
Plan Fractions Treated to Date: 13
Plan Fractions Treated to Date: 25
Plan Prescribed Dose Per Fraction: 2 Gy
Plan Prescribed Dose Per Fraction: 2 Gy
Plan Total Fractions Prescribed: 13
Plan Total Fractions Prescribed: 25
Plan Total Prescribed Dose: 26 Gy
Plan Total Prescribed Dose: 50 Gy
Reference Point Dosage Given to Date: 50 Gy
Reference Point Dosage Given to Date: 50 Gy
Reference Point Session Dosage Given: 2 Gy
Reference Point Session Dosage Given: 2 Gy
Session Number: 25

## 2022-08-17 ENCOUNTER — Ambulatory Visit: Payer: Federal, State, Local not specified - PPO

## 2022-08-17 ENCOUNTER — Ambulatory Visit
Admission: RE | Admit: 2022-08-17 | Discharge: 2022-08-17 | Disposition: A | Discharge status: Home / Self Care | Payer: Federal, State, Local not specified - PPO | Source: Ambulatory Visit | Attending: Radiation Oncology | Admitting: Radiation Oncology

## 2022-08-17 ENCOUNTER — Other Ambulatory Visit: Payer: Self-pay

## 2022-08-17 DIAGNOSIS — C50911 Malignant neoplasm of unspecified site of right female breast: Secondary | ICD-10-CM | POA: Diagnosis not present

## 2022-08-17 LAB — RAD ONC ARIA SESSION SUMMARY
Course Elapsed Days: 35
Plan Fractions Treated to Date: 1
Plan Prescribed Dose Per Fraction: 2 Gy
Plan Total Fractions Prescribed: 5
Plan Total Prescribed Dose: 10 Gy
Reference Point Dosage Given to Date: 2 Gy
Reference Point Session Dosage Given: 2 Gy
Session Number: 26

## 2022-08-18 ENCOUNTER — Other Ambulatory Visit: Payer: Self-pay

## 2022-08-18 ENCOUNTER — Ambulatory Visit: Payer: Federal, State, Local not specified - PPO

## 2022-08-18 ENCOUNTER — Ambulatory Visit
Admission: RE | Admit: 2022-08-18 | Discharge: 2022-08-18 | Disposition: A | Discharge status: Home / Self Care | Payer: Federal, State, Local not specified - PPO | Source: Ambulatory Visit | Attending: Radiation Oncology | Admitting: Radiation Oncology

## 2022-08-18 DIAGNOSIS — C50911 Malignant neoplasm of unspecified site of right female breast: Secondary | ICD-10-CM

## 2022-08-18 LAB — RAD ONC ARIA SESSION SUMMARY
Course Elapsed Days: 36
Plan Fractions Treated to Date: 2
Plan Prescribed Dose Per Fraction: 2 Gy
Plan Total Fractions Prescribed: 5
Plan Total Prescribed Dose: 10 Gy
Reference Point Dosage Given to Date: 4 Gy
Reference Point Session Dosage Given: 2 Gy
Session Number: 27

## 2022-08-18 MED ORDER — RADIAPLEXRX EX GEL: Freq: Once | CUTANEOUS | Status: AC

## 2022-08-18 MED ORDER — SILVER SULFADIAZINE 1 % EX CREA: TOPICAL_CREAM | Freq: Two times a day (BID) | CUTANEOUS | Status: DC

## 2022-08-21 ENCOUNTER — Other Ambulatory Visit: Payer: Self-pay

## 2022-08-21 ENCOUNTER — Ambulatory Visit: Payer: Federal, State, Local not specified - PPO

## 2022-08-21 ENCOUNTER — Ambulatory Visit
Admission: RE | Admit: 2022-08-21 | Discharge: 2022-08-21 | Disposition: A | Discharge status: Home / Self Care | Payer: Federal, State, Local not specified - PPO | Source: Ambulatory Visit | Attending: Radiation Oncology | Admitting: Radiation Oncology

## 2022-08-21 DIAGNOSIS — C50911 Malignant neoplasm of unspecified site of right female breast: Secondary | ICD-10-CM | POA: Insufficient documentation

## 2022-08-21 LAB — RAD ONC ARIA SESSION SUMMARY
Course Elapsed Days: 39
Plan Fractions Treated to Date: 3
Plan Prescribed Dose Per Fraction: 2 Gy
Plan Total Fractions Prescribed: 5
Plan Total Prescribed Dose: 10 Gy
Reference Point Dosage Given to Date: 6 Gy
Reference Point Session Dosage Given: 2 Gy
Session Number: 28

## 2022-08-22 ENCOUNTER — Other Ambulatory Visit: Payer: Self-pay

## 2022-08-22 ENCOUNTER — Ambulatory Visit
Admission: RE | Admit: 2022-08-22 | Discharge: 2022-08-22 | Disposition: A | Discharge status: Home / Self Care | Payer: Federal, State, Local not specified - PPO | Source: Ambulatory Visit | Attending: Radiation Oncology | Admitting: Radiation Oncology

## 2022-08-22 DIAGNOSIS — C50911 Malignant neoplasm of unspecified site of right female breast: Secondary | ICD-10-CM | POA: Diagnosis not present

## 2022-08-22 LAB — RAD ONC ARIA SESSION SUMMARY
Course Elapsed Days: 40
Plan Fractions Treated to Date: 4
Plan Prescribed Dose Per Fraction: 2 Gy
Plan Total Fractions Prescribed: 5
Plan Total Prescribed Dose: 10 Gy
Reference Point Dosage Given to Date: 8 Gy
Reference Point Session Dosage Given: 2 Gy
Session Number: 29

## 2022-08-23 ENCOUNTER — Other Ambulatory Visit: Payer: Self-pay

## 2022-08-23 ENCOUNTER — Ambulatory Visit: Payer: Federal, State, Local not specified - PPO

## 2022-08-23 ENCOUNTER — Inpatient Hospital Stay: Payer: Federal, State, Local not specified - PPO

## 2022-08-23 ENCOUNTER — Ambulatory Visit
Admission: RE | Admit: 2022-08-23 | Discharge: 2022-08-23 | Disposition: A | Discharge status: Home / Self Care | Payer: Federal, State, Local not specified - PPO | Source: Ambulatory Visit | Attending: Radiation Oncology | Admitting: Radiation Oncology

## 2022-08-23 VITALS — BP 136/93 | HR 103 | Temp 98.1°F | Resp 18

## 2022-08-23 DIAGNOSIS — C50811 Malignant neoplasm of overlapping sites of right female breast: Secondary | ICD-10-CM | POA: Insufficient documentation

## 2022-08-23 DIAGNOSIS — C50911 Malignant neoplasm of unspecified site of right female breast: Secondary | ICD-10-CM | POA: Diagnosis not present

## 2022-08-23 DIAGNOSIS — Z79899 Other long term (current) drug therapy: Secondary | ICD-10-CM | POA: Insufficient documentation

## 2022-08-23 DIAGNOSIS — Z5111 Encounter for antineoplastic chemotherapy: Secondary | ICD-10-CM | POA: Insufficient documentation

## 2022-08-23 DIAGNOSIS — Z510 Encounter for antineoplastic radiation therapy: Secondary | ICD-10-CM | POA: Diagnosis not present

## 2022-08-23 DIAGNOSIS — Z170 Estrogen receptor positive status [ER+]: Secondary | ICD-10-CM | POA: Insufficient documentation

## 2022-08-23 LAB — RAD ONC ARIA SESSION SUMMARY
Course Elapsed Days: 41
Plan Fractions Treated to Date: 5
Plan Prescribed Dose Per Fraction: 2 Gy
Plan Total Fractions Prescribed: 5
Plan Total Prescribed Dose: 10 Gy
Reference Point Dosage Given to Date: 10 Gy
Reference Point Session Dosage Given: 2 Gy
Session Number: 30

## 2022-08-23 MED ORDER — GOSERELIN ACETATE 3.6 MG ~~LOC~~ IMPL
3.6000 mg | DRUG_IMPLANT | Freq: Once | SUBCUTANEOUS | Status: AC
  Administered 2022-08-23: 3.6 mg via SUBCUTANEOUS
  Filled 2022-08-23: qty 3.6

## 2022-08-23 NOTE — Patient Instructions (Signed)
Goserelin Implant What is this medication? GOSERELIN (GOE se rel in) treats prostate cancer and breast cancer. It works by decreasing levels of the hormones testosterone and estrogen in the body. This prevents prostate and breast cancer cells from spreading or growing. It may also be used to treat endometriosis. This is a condition where the tissue that lines the uterus grows outside the uterus. It works by decreasing the amount of estrogen your body makes, which reduces heavy bleeding and pain. It can also be used to help thin the lining of the uterus before a surgery used to prevent or reduce heavy periods. This medicine may be used for other purposes; ask your health care provider or pharmacist if you have questions. COMMON BRAND NAME(S): Zoladex, Zoladex 3-Month What should I tell my care team before I take this medication? They need to know if you have any of these conditions: Bone problems Diabetes Heart disease History of irregular heartbeat or rhythm An unusual or allergic reaction to goserelin, other medications, foods, dyes, or preservatives Pregnant or trying to get pregnant Breastfeeding How should I use this medication? This medication is injected under the skin. It is given by your care team in a hospital or clinic setting. Talk to your care team about the use of this medication in children. Special care may be needed. Overdosage: If you think you have taken too much of this medicine contact a poison control center or emergency room at once. NOTE: This medicine is only for you. Do not share this medicine with others. What if I miss a dose? Keep appointments for follow-up doses. It is important not to miss your dose. Call your care team if you are unable to keep an appointment. What may interact with this medication? Do not take this medication with any of the following: Cisapride Dronedarone Pimozide Thioridazine This medication may also interact with the following: Other  medications that cause heart rhythm changes This list may not describe all possible interactions. Give your health care provider a list of all the medicines, herbs, non-prescription drugs, or dietary supplements you use. Also tell them if you smoke, drink alcohol, or use illegal drugs. Some items may interact with your medicine. What should I watch for while using this medication? Visit your care team for regular checks on your progress. Your symptoms may appear to get worse during the first weeks of this therapy. Tell your care team if your symptoms do not start to get better or if they get worse after this time. Using this medication for a long time may weaken your bones. If you smoke or frequently drink alcohol you may increase your risk of bone loss. A family history of osteoporosis, chronic use of medications for seizures (convulsions), or corticosteroids can also increase your risk of bone loss. The risk of bone fractures may be increased. Talk to your care team about your bone health. This medication may increase blood sugar. The risk may be higher in patients who already have diabetes. Ask your care team what you can do to lower your risk of diabetes while taking this medication. This medication should stop regular monthly menstruation in women. Tell your care team if you continue to menstruate. Talk to your care team if you wish to become pregnant or think you might be pregnant. This medication can cause serious birth defects if taken during pregnancy or for 12 weeks after stopping treatment. Talk to your care team about reliable forms of contraception. Do not breastfeed while taking this   medication. This medication may cause infertility. Talk to your care team if you are concerned about your fertility. What side effects may I notice from receiving this medication? Side effects that you should report to your care team as soon as possible: Allergic reactions--skin rash, itching, hives, swelling  of the face, lips, tongue, or throat Change in the amount of urine Heart attack--pain or tightness in the chest, shoulders, arms, or jaw, nausea, shortness of breath, cold or clammy skin, feeling faint or lightheaded Heart rhythm changes--fast or irregular heartbeat, dizziness, feeling faint or lightheaded, chest pain, trouble breathing High blood sugar (hyperglycemia)--increased thirst or amount of urine, unusual weakness or fatigue, blurry vision High calcium level--increased thirst or amount of urine, nausea, vomiting, confusion, unusual weakness or fatigue, bone pain Pain, redness, irritation, or bruising at the injection site Severe back pain, numbness or weakness of the hands, arms, legs, or feet, loss of coordination, loss of bowel or bladder control Stroke--sudden numbness or weakness of the face, arm, or leg, trouble speaking, confusion, trouble walking, loss of balance or coordination, dizziness, severe headache, change in vision Swelling and pain of the tumor site or lymph nodes Trouble passing urine Side effects that usually do not require medical attention (report to your care team if they continue or are bothersome): Change in sex drive or performance Headache Hot flashes Rapid or extreme change in emotion or mood Sweating Swelling of the ankles, hands, or feet Unusual vaginal discharge, itching, or odor This list may not describe all possible side effects. Call your doctor for medical advice about side effects. You may report side effects to FDA at 1-800-FDA-1088. Where should I keep my medication? This medication is given in a hospital or clinic. It will not be stored at home. NOTE: This sheet is a summary. It may not cover all possible information. If you have questions about this medicine, talk to your doctor, pharmacist, or health care provider.  2023 Elsevier/Gold Standard (2021-09-21 00:00:00)  

## 2022-08-24 ENCOUNTER — Inpatient Hospital Stay: Payer: Federal, State, Local not specified - PPO

## 2022-08-24 ENCOUNTER — Ambulatory Visit: Payer: Federal, State, Local not specified - PPO

## 2022-08-25 ENCOUNTER — Telehealth: Payer: Self-pay | Admitting: Adult Health

## 2022-08-25 NOTE — Telephone Encounter (Signed)
Reached out to patient to let her know of time and date of appointment.

## 2022-08-25 NOTE — Radiation Completion Notes (Signed)
Patient Name: Brooke Marks, Brooke Marks MRN: [id number] Date of Birth: [birth year] Referring Physician: Shelby Mattocks, M.D. Date of Service: 2022-08-25 Radiation Oncologist: Lonie Peak, M.D. [facility]                             RADIATION ONCOLOGY END OF TREATMENT NOTE     Diagnosis: C50.411 Malignant neoplasm of upper-outer quadrant of right female breast Staging on 2022-05-24: Malignant neoplasm of overlapping 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, estrogen receptor positive T=pT3, N=pN1, M=cM0 Staging on 2022-03-17: Malignant neoplasm of overlapping 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, estrogen receptor positive T=cT1b, N=cN1, M=cM0 Intent: Curative     ==========DELIVERED PLANS==========  First Treatment Date: 2022-07-13 - Last Treatment Date: 2022-08-23   Plan Name: CW_R_BO Site: Chest Wall, Right Technique: 3D Mode: Photon Dose Per Fraction: 2 Gy Prescribed Dose (Delivered / Prescribed): 26 Gy / 26 Gy Prescribed Fxs (Delivered / Prescribed): 13 / 13   Plan Name: CW_R Site: Chest Wall, Right Technique: 3D Mode: Photon Dose Per Fraction: 2 Gy Prescribed Dose (Delivered / Prescribed): 24 Gy / 24 Gy Prescribed Fxs (Delivered / Prescribed): 12 / 12   Plan Name: CW_R_Scv Site: Chest Wall, Right Technique: 3D Mode: Photon Dose Per Fraction: 2 Gy Prescribed Dose (Delivered / Prescribed): 50 Gy / 50 Gy Prescribed Fxs (Delivered / Prescribed): 25 / 25   Plan Name: CW_R_Bst_BO Site: Chest Wall, Right Technique: Electron Mode: Electron Dose Per Fraction: 2 Gy Prescribed Dose (Delivered / Prescribed): 10 Gy / 10 Gy Prescribed Fxs (Delivered / Prescribed): 5 / 5     ==========ON TREATMENT VISIT DATES========== 2022-07-17, 2022-07-24, 2022-07-31, 2022-08-07, 2022-08-14, 2022-08-18     ==========UPCOMING VISITS==========       ==========APPENDIX - ON TREATMENT VISIT NOTES==========   See weekly On Treatment Notes is Epic for details.

## 2022-09-09 ENCOUNTER — Encounter: Payer: Self-pay | Admitting: Student

## 2022-09-11 ENCOUNTER — Ambulatory Visit
Admission: EM | Admit: 2022-09-11 | Discharge: 2022-09-11 | Disposition: A | Discharge status: Home / Self Care | Payer: Federal, State, Local not specified - PPO | Attending: Internal Medicine | Admitting: Internal Medicine

## 2022-09-11 DIAGNOSIS — N3001 Acute cystitis with hematuria: Secondary | ICD-10-CM | POA: Diagnosis not present

## 2022-09-11 LAB — POCT URINALYSIS DIP (MANUAL ENTRY)
Bilirubin, UA: NEGATIVE
Glucose, UA: NEGATIVE mg/dL
Ketones, POC UA: NEGATIVE mg/dL
Nitrite, UA: NEGATIVE
Protein Ur, POC: NEGATIVE mg/dL
Spec Grav, UA: 1.015 (ref 1.010–1.025)
Urobilinogen, UA: 0.2 E.U./dL
pH, UA: 7 (ref 5.0–8.0)

## 2022-09-11 MED ORDER — PHENAZOPYRIDINE HCL 200 MG PO TABS: 200.0000 mg | ORAL_TABLET | Freq: Three times a day (TID) | ORAL | 0 refills | Status: DC

## 2022-09-11 MED ORDER — SULFAMETHOXAZOLE-TRIMETHOPRIM 800-160 MG PO TABS: 1.0000 | ORAL_TABLET | Freq: Two times a day (BID) | ORAL | 0 refills | Status: AC

## 2022-09-11 NOTE — ED Notes (Signed)
Unable to obtain pulse ox/HR reading 2/2 pt has artificial nails that are thick and approx 5-6 inches in length. Provider notified.

## 2022-09-11 NOTE — ED Provider Notes (Signed)
[facility]    CSN: [id number] Arrival date & time: 09/11/22  0805      History   Chief Complaint Chief Complaint  Patient presents with   Dysuria    HPI Brooke Marks is a 42 y.o. female.   Patient presents today with lower abdominal pressure frequency and burning on urination since Saturday.  Patient has also noticed a slight amount of blood in urine with wiping.  Denies any vaginal discharge.  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 Rarely uses her cell phone for calls. Usually communicates via text. Affecting her job. Works with [employer]. 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 Concerned that her employer is planning to return her previous position. Denies agoraphobia.      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 03/30/2019   Hypertension 01/27/2016   S/P cesarean section 01/27/2016   Anxiety state 04/13/2015   CARPAL TUNNEL SYNDROME, LEFT 04/11/2010   HERPES SIMPLEX INFECTION, RECURRENT 02/26/2009   Obesity (BMI 30.0-34.9) 07/19/2006    Past Surgical History:  Procedure Laterality Date   BREAST BIOPSY  05/05/2022   [place] RT RADIOACTIVE SEED LOC 05/05/2022 [facility]   CESAREAN SECTION      CESAREAN SECTION N/A 01/27/2016   Procedure: CESAREAN SECTION;  Surgeon: Sherian Rein, MD;  Location: [facility];  Service: Obstetrics;  Laterality: N/A;  MD requests RNFA   COLON RESECTION N/A 03/30/2019   Procedure: LAPAROSCOPIC RIGHT COLECTOMY WITH RIGHT SALPINGO OOPHORECTOMY TAP BLOCK;  Surgeon: Karie Soda, MD;  Location: [facility];  Service: General;  Laterality: N/A;   LAPAROSCOPIC LYSIS OF ADHESIONS N/A 03/30/2019   Procedure: LAPAROSCOPIC LYSIS OF ADHESIONS;  Surgeon: Karie Soda, MD;  Location: [facility];  Service: General;  Laterality: N/A;   MASTECTOMY W/ SENTINEL NODE BIOPSY Right 05/08/2022   Procedure: RIGHT MASTECTOMY WITH AXILLARY SENTINEL LYMPH NODE BIOPSY;  Surgeon: Emelia Loron, MD;  Location: [facility];  Service: General;  Laterality: Right;   RADIOACTIVE SEED GUIDED AXILLARY SENTINEL LYMPH NODE Right 05/08/2022   Procedure: RADIOACTIVE SEED GUIDED RIGHT AXILLARY SENTINEL LYMPH NODE EXCISION;  Surgeon: Emelia Loron, MD;  Location: [facility];  Service: General;  Laterality: Right;   WISDOM TOOTH EXTRACTION      OB History     Gravida  3   Para  1   Term  1   Preterm  0   AB  2   Living  1      SAB  0   IAB  2   Ectopic  0   Multiple      Live Births  1            Home  Medications    Prior to Admission medications   Medication Sig Start Date End Date Taking? Authorizing Provider  phenazopyridine (PYRIDIUM) 200 MG tablet Take 1 tablet (200 mg total) by mouth 3 (three) times daily. 09/11/22  Yes Coralyn Mark, NP  sulfamethoxazole-trimethoprim (BACTRIM DS) 800-160 MG tablet Take 1 tablet by mouth 2 (two) times daily for 7 days. 09/11/22 09/18/22 Yes Coralyn Mark, NP  amLODipine (NORVASC) 10 MG tablet TAKE 1 TABLET BY MOUTH EVERY DAY 02/14/22   Shelby Mattocks, DO  anastrozole (ARIMIDEX) 1 MG tablet Take 1 tablet (1 mg total) by mouth daily. 08/21/22   Serena Croissant, MD  Multiple  Vitamin (MULTIVITAMIN) tablet Take 1 tablet by mouth daily.    [provider]    Family History Family History  Problem Relation Age of Onset   Diabetes Mother    Hypertension Father    Lung cancer Maternal Aunt        dx after 15   Stomach cancer Maternal Uncle        dx 70s-80s   Prostate cancer Maternal Grandfather 60       d. 60; mets   Cancer Cousin        unknown type; dx after 18; pat female cousin    Social History Social History   Tobacco Use   Smoking status: Never    Passive exposure: Never   Smokeless tobacco: Never  Vaping Use   Vaping Use: Never used  Substance Use Topics   Alcohol use: No   Drug use: No     Allergies   Patient has no known allergies.   Review of Systems Review of Systems  Constitutional:  Negative for fever.  Respiratory: Negative.    Cardiovascular: Negative.   Gastrointestinal:  Negative for abdominal pain, nausea and vomiting.  Genitourinary:  Positive for flank pain, hematuria and urgency.     Physical Exam Triage Vital Signs ED Triage Vitals [09/11/22 0819]  Enc Vitals Group     BP      Pulse      Resp      Temp      Temp src      SpO2      Weight      Height      Head Circumference      Peak Flow      Pain Score 0     Pain Loc      Pain Edu?      Excl. in GC?    No data found.  Updated Vital Signs Breastfeeding No   Visual Acuity Right Eye Distance:   Left Eye Distance:   Bilateral Distance:    Right Eye Near:   Left Eye Near:    Bilateral Near:     Physical Exam Constitutional:      Appearance: Normal appearance.  Cardiovascular:     Rate and Rhythm: Normal rate.  Pulmonary:     Effort: Pulmonary effort is normal.  Abdominal:     General: Abdomen is flat.     Tenderness: There is left CVA tenderness. There is no right CVA tenderness.  Neurological:     General: No focal deficit present.     Mental Status: She is alert.      UC Treatments / Results  Labs (all labs ordered  are listed, but only abnormal results are displayed) Labs Reviewed  POCT URINALYSIS DIP (MANUAL ENTRY) - Abnormal; Notable for the following components:  Result Value   Clarity, UA cloudy (*)    Blood, UA trace-intact (*)    Leukocytes, UA Small (1+) (*)    All other components within normal limits    EKG   Radiology No results found.  Procedures Procedures (including critical care time)  Medications Ordered in UC Medications - No data to display  Initial Impression / Assessment and Plan / UC Course  I have reviewed the triage vital signs and the nursing notes.  Pertinent labs & imaging results that were available during my care of the patient were reviewed by me and considered in my medical decision making (see chart for details).     Patient will need to follow-up with her PCP after full dose of antibiotics to recheck urine Stay hydrated drink plenty of fluids Take full dose of antibiotics with food Take Pyridium only as needed  Final Clinical Impressions(s) / UC Diagnoses   Final diagnoses:  Acute cystitis with hematuria     Discharge Instructions      Patient will need to follow-up with her PCP after full dose of antibiotics to recheck urine Stay hydrated drink plenty of fluids Take full dose of antibiotics with food Take Pyridium only as needed     ED Prescriptions     Medication Sig Dispense Auth. Provider   phenazopyridine (PYRIDIUM) 200 MG tablet Take 1 tablet (200 mg total) by mouth 3 (three) times daily. 6 tablet Maple Mirza L, NP   sulfamethoxazole-trimethoprim (BACTRIM DS) 800-160 MG tablet Take 1 tablet by mouth 2 (two) times daily for 7 days. 14 tablet Coralyn Mark, NP      PDMP not reviewed this encounter.   Coralyn Mark, NP 09/11/22 [phone]

## 2022-09-11 NOTE — Progress Notes (Signed)
Brooke Marks presents today for follow-up after completing radiation to her right breast on 08-23-22.   Pain: denies at this time Skin: doing well, pleased with her healing, she was worried after treatment but not very happy, encouraged [place] of vitamin E cream/lotion ROM: normal Lymphedema: none to report MedOnc F/U: Brooke Marks 10-19-22.  Other issues of note: No major or concerns to report at this time. Pt will call with any needs or concerns.   Pt reports Yes No Comments  Tamoxifen []  [x]    Letrozole []  [x]    Anastrazole [x]  []  1mg , causing mood issues and poor appetite for pt, seeing Mardella Layman for this concern tomorrow.   Mammogram [x]  Date:  []  Encouraged getting yearly mammograms

## 2022-09-11 NOTE — ED Triage Notes (Signed)
Pt c/o urgency, cloudy urine, dysuria, frequency,   Onset ~ sat

## 2022-09-11 NOTE — Discharge Instructions (Addendum)
Patient will need to follow-up with her PCP after full dose of antibiotics to recheck urine Stay hydrated drink plenty of fluids Take full dose of antibiotics with food Take Pyridium only as needed

## 2022-09-12 ENCOUNTER — Other Ambulatory Visit: Payer: Self-pay | Admitting: Student

## 2022-09-12 DIAGNOSIS — C50811 Malignant neoplasm of overlapping sites of right female breast: Secondary | ICD-10-CM

## 2022-09-12 LAB — URINE CULTURE

## 2022-09-12 NOTE — Progress Notes (Signed)
Pt would like more information about removing L ovary for preventative measures as she has breast cancer. Referral to gyn-onc placed.

## 2022-09-19 ENCOUNTER — Encounter: Payer: Self-pay | Admitting: *Deleted

## 2022-09-19 ENCOUNTER — Encounter: Payer: Self-pay | Admitting: Hematology and Oncology

## 2022-09-19 NOTE — Progress Notes (Signed)
Received mychart message from pt with complaint of decreased appetite, weight gain, depression and headaches while on Zoladex and anastrozole.  Pt requesting to discuss with provider other therapy options.  Appt scheduled, pt notified and verbalized understanding of appt details.

## 2022-09-21 ENCOUNTER — Telehealth: Payer: Self-pay

## 2022-09-21 ENCOUNTER — Inpatient Hospital Stay: Payer: Federal, State, Local not specified - PPO

## 2022-09-21 ENCOUNTER — Ambulatory Visit
Admission: RE | Admit: 2022-09-21 | Discharge: 2022-09-21 | Disposition: A | Discharge status: Home / Self Care | Payer: Federal, State, Local not specified - PPO | Source: Ambulatory Visit | Attending: Radiation Oncology | Admitting: Radiation Oncology

## 2022-09-21 NOTE — Telephone Encounter (Signed)
Rn called pt for follow up for treatment of breast cancer. She was doing well with no needs. Note routed to Dr. Basilio Cairo.

## 2022-09-22 ENCOUNTER — Inpatient Hospital Stay: Payer: Federal, State, Local not specified - PPO | Attending: Hematology and Oncology | Admitting: Adult Health

## 2022-09-22 ENCOUNTER — Ambulatory Visit: Payer: Self-pay | Admitting: Radiation Oncology

## 2022-09-22 ENCOUNTER — Other Ambulatory Visit: Payer: Self-pay

## 2022-09-22 ENCOUNTER — Inpatient Hospital Stay: Payer: Federal, State, Local not specified - PPO

## 2022-09-22 ENCOUNTER — Telehealth: Payer: Self-pay | Admitting: *Deleted

## 2022-09-22 ENCOUNTER — Encounter: Payer: Self-pay | Admitting: Adult Health

## 2022-09-22 VITALS — BP 141/84 | HR 104 | Temp 97.7°F | Resp 14 | Wt 182.0 lb

## 2022-09-22 DIAGNOSIS — Z79811 Long term (current) use of aromatase inhibitors: Secondary | ICD-10-CM | POA: Diagnosis not present

## 2022-09-22 DIAGNOSIS — Z170 Estrogen receptor positive status [ER+]: Secondary | ICD-10-CM | POA: Insufficient documentation

## 2022-09-22 DIAGNOSIS — Z90721 Acquired absence of ovaries, unilateral: Secondary | ICD-10-CM | POA: Diagnosis not present

## 2022-09-22 DIAGNOSIS — F32A Depression, unspecified: Secondary | ICD-10-CM | POA: Diagnosis not present

## 2022-09-22 DIAGNOSIS — Z85828 Personal history of other malignant neoplasm of skin: Secondary | ICD-10-CM | POA: Insufficient documentation

## 2022-09-22 DIAGNOSIS — Z800 Family history of malignant neoplasm of digestive organs: Secondary | ICD-10-CM | POA: Diagnosis not present

## 2022-09-22 DIAGNOSIS — Z9011 Acquired absence of right breast and nipple: Secondary | ICD-10-CM | POA: Insufficient documentation

## 2022-09-22 DIAGNOSIS — E785 Hyperlipidemia, unspecified: Secondary | ICD-10-CM | POA: Insufficient documentation

## 2022-09-22 DIAGNOSIS — Z801 Family history of malignant neoplasm of trachea, bronchus and lung: Secondary | ICD-10-CM | POA: Diagnosis not present

## 2022-09-22 DIAGNOSIS — Z8249 Family history of ischemic heart disease and other diseases of the circulatory system: Secondary | ICD-10-CM | POA: Diagnosis not present

## 2022-09-22 DIAGNOSIS — Z923 Personal history of irradiation: Secondary | ICD-10-CM | POA: Insufficient documentation

## 2022-09-22 DIAGNOSIS — Z833 Family history of diabetes mellitus: Secondary | ICD-10-CM | POA: Diagnosis not present

## 2022-09-22 DIAGNOSIS — M79621 Pain in right upper arm: Secondary | ICD-10-CM | POA: Insufficient documentation

## 2022-09-22 DIAGNOSIS — Z8042 Family history of malignant neoplasm of prostate: Secondary | ICD-10-CM | POA: Insufficient documentation

## 2022-09-22 DIAGNOSIS — Z5111 Encounter for antineoplastic chemotherapy: Secondary | ICD-10-CM | POA: Insufficient documentation

## 2022-09-22 DIAGNOSIS — N83202 Unspecified ovarian cyst, left side: Secondary | ICD-10-CM | POA: Diagnosis not present

## 2022-09-22 DIAGNOSIS — Z9049 Acquired absence of other specified parts of digestive tract: Secondary | ICD-10-CM | POA: Diagnosis not present

## 2022-09-22 DIAGNOSIS — Z79899 Other long term (current) drug therapy: Secondary | ICD-10-CM | POA: Insufficient documentation

## 2022-09-22 DIAGNOSIS — C50811 Malignant neoplasm of overlapping sites of right female breast: Secondary | ICD-10-CM | POA: Diagnosis not present

## 2022-09-22 MED ORDER — GOSERELIN ACETATE 3.6 MG ~~LOC~~ IMPL
3.6000 mg | DRUG_IMPLANT | Freq: Once | SUBCUTANEOUS | Status: AC
  Administered 2022-09-22: 3.6 mg via SUBCUTANEOUS
  Filled 2022-09-22: qty 3.6

## 2022-09-22 NOTE — Assessment & Plan Note (Addendum)
Brooke Marks is a 42 year old woman who is here for stage Ib ER/PR positive breast cancer status post right lumpectomy, adjuvant radiation, and antiestrogen therapy with Zoladex and anastrozole.  We spent a majority of our visit today talking about her anxiety related to fear of breast cancer recurrence.  Talked about what happens when she starts to get these feelings of being scared about breast cancer recurrence and discussed how to change these negative thoughts with more helpful or constructive thoughts when she gets them.  She understands this and is me that she is pretty confident she is able to work on this between now and her next visit with [place] in 4 weeks to move that forward.  We also discussed options for breast cancer surveillance including Signatera testing.  She is going to think about this and we will discuss further at her survivorship visit in 4 weeks.  I also placed a referral to GYN oncology for oophorectomy  She will return in 4 weeks for her survivorship care plan visit and her injection.

## 2022-09-22 NOTE — Progress Notes (Signed)
[facility] Cancer Follow up:    Shelby Mattocks, DO [address] 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, estrogen receptor positive (HCC) Staging form: Breast, AJCC 8th Edition - Clinical stage from 03/17/2022: Stage IB (cT1b, cN1, cM0, G2, ER+, PR+, HER2-) - Signed by Serena Croissant, MD on 03/17/2022 Stage prefix: Initial diagnosis Histologic grading system: 3 grade system - Pathologic: Stage IB (pT3, pN1, cM0, G2, ER+, PR+, HER2-) - Signed by Serena Croissant, MD on 05/24/2022 Histologic grading system: 3 grade system   SUMMARY OF ONCOLOGIC HISTORY: Oncology History  Malignant neoplasm of overlapping 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, estrogen receptor positive (HCC)  03/08/2022 Initial Diagnosis   Palpable lump in the right breast, mammogram and ultrasound revealed 0.8 cm and 0.8 cm and 0.5 cm masses, 10 cm suspicious calcifications, single enlarged right axillary lymph node 11:00: Grade 2 IDC with IG DCIS ER 70%, PR 90%, Ki-67 20%, HER2 1+ negative Lymph node: Positive UIQ and UOQ right breast: Grade 2 IDC with IG DCIS   03/17/2022 Cancer Staging   Staging form: Breast, AJCC 8th Edition - Clinical stage from 03/17/2022: Stage IB (cT1b, cN1, cM0, G2, ER+, PR+, HER2-) - Signed by Serena Croissant, MD on 03/17/2022 Stage prefix: Initial diagnosis Histologic grading system: 3 grade system   03/30/2022 Genetic Testing   Negative [facility] CustomNext-Cancer +RNAinsight Panel.  Report date is 03/30/2022.   The CustomNext-Cancer+RNAinsight panel offered by [employer] [facility] includes sequencing and rearrangement analysis for the following 37 genes:  APC, ATM, BARD1, BMPR1A, BRCA1, BRCA2, BRIP1, CDH1, CDK4, CDKN2A, CHEK2, DICER1, MLH1, MSH2, MSH6, MUTYH, NBN, NF1, NTHL1, PALB2, PMS2, PTEN, RAD51C, RAD51D, RECQL, SMAD4, SMARCA4, STK11 and TP53 (sequencing and deletion/duplication); AXIN2, CTNNA1, HOXB13, MSH3,  POLD1 and POLE (sequencing only); EPCAM and GREM1 (deletion/duplication only). RNA data is routinely analyzed for use in variant interpretation for all genes   05/08/2022 Surgery   Right lumpectomy: Grade 2 IDC 8 cm with intermediate grade DCIS, margins negative, ER 70%, PR 90%, HER2 negative 1+, 1/4 lymph nodes positive with focal extracapsular extension   05/24/2022 Cancer Staging   Staging form: Breast, AJCC 8th Edition - Pathologic: Stage IB (pT3, pN1, cM0, G2, ER+, PR+, HER2-) - Signed by Serena Croissant, MD on 05/24/2022 Histologic grading system: 3 grade system   07/13/2022 - 08/23/2022 Radiation Therapy   Plan Name: CW_R_BO Site: Chest Wall, Right Technique: 3D Mode: Photon Dose Per Fraction: 2 Gy Prescribed Dose (Delivered / Prescribed): 26 Gy / 26 Gy Prescribed Fxs (Delivered / Prescribed): 13 / 13   Plan Name: CW_R Site: Chest Wall, Right Technique: 3D Mode: Photon Dose Per Fraction: 2 Gy Prescribed Dose (Delivered / Prescribed): 24 Gy / 24 Gy Prescribed Fxs (Delivered / Prescribed): 12 / 12   Plan Name: CW_R_Scv Site: Chest Wall, Right Technique: 3D Mode: Photon Dose Per Fraction: 2 Gy Prescribed Dose (Delivered / Prescribed): 50 Gy / 50 Gy Prescribed Fxs (Delivered / Prescribed): 25 / 25   Plan Name: CW_R_Bst_BO Site: Chest Wall, Right Technique: Electron Mode: Electron Dose Per Fraction: 2 Gy Prescribed Dose (Delivered / Prescribed): 10 Gy / 10 Gy Prescribed Fxs (Delivered / Prescribed): 5 / 5   08/2022 -  Anti-estrogen oral therapy   Anastrozole daily with zoladex every 4 weeks     CURRENT THERAPY: Zoladex/anastrozole  INTERVAL HISTORY: Brooke Marks 42 y.o. female returns for follow-up of her breast cancer currently receiving  injections given every 4 weeks with Zoladex and is taking anastrozole daily.  She is tolerating this well however she notes that she is terrified about her breast cancer returning.  She also tells me that she does not like to go to  our injection room to receive her Zoladex because it brings back stress and anxiety surrounding having cancer.  She is exploring oophorectomy however has struggled to get in with OB/GYN.  She denies significant hot flashes or vaginal dryness or joint aches and pains.  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 Rarely uses her cell phone for calls. Usually communicates via text. Affecting her job. Works with [employer]. 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 Concerned that her employer is planning to return her previous position. Denies agoraphobia.      PIH (pregnancy induced hypertension) 01/27/2016   PONV (postoperative nausea and vomiting)    S/P cesarean section 01/27/2016    SURGICAL HISTORY: Past Surgical History:  Procedure Laterality Date   BREAST BIOPSY  05/05/2022   [place] RT RADIOACTIVE SEED LOC 05/05/2022 [facility]   CESAREAN SECTION      CESAREAN SECTION N/A 01/27/2016   Procedure: CESAREAN SECTION;  Surgeon: Sherian Rein, MD;  Location: [facility];  Service: Obstetrics;  Laterality: N/A;  MD requests RNFA   COLON RESECTION N/A 03/30/2019   Procedure: LAPAROSCOPIC RIGHT COLECTOMY WITH RIGHT SALPINGO OOPHORECTOMY TAP BLOCK;  Surgeon: Karie Soda, MD;  Location: [facility];  Service: General;  Laterality: N/A;   LAPAROSCOPIC LYSIS OF ADHESIONS N/A 03/30/2019   Procedure: LAPAROSCOPIC LYSIS OF ADHESIONS;  Surgeon: Karie Soda, MD;  Location: [facility];  Service: General;  Laterality: N/A;   MASTECTOMY W/ SENTINEL NODE BIOPSY Right 05/08/2022   Procedure: RIGHT MASTECTOMY WITH AXILLARY SENTINEL LYMPH NODE BIOPSY;  Surgeon: Emelia Loron, MD;  Location: [facility];  Service: General;  Laterality: Right;   RADIOACTIVE SEED GUIDED AXILLARY SENTINEL LYMPH NODE Right 05/08/2022   Procedure: RADIOACTIVE SEED GUIDED RIGHT AXILLARY SENTINEL LYMPH NODE EXCISION;  Surgeon: Emelia Loron, MD;  Location: [facility];  Service: General;  Laterality: Right;   WISDOM TOOTH EXTRACTION      SOCIAL HISTORY: Social History   Socioeconomic History   Marital status: Married    Spouse name: Not on file   Number of children: Not on file   Years of education: Not on file   Highest education  level: Not on file  Occupational History   Not on file  Tobacco Use   Smoking status: Never    Passive exposure: Never   Smokeless tobacco: Never  Vaping Use   Vaping Use: Never used  Substance and Sexual Activity   Alcohol use: No   Drug use: No   Sexual activity: Yes    Birth control/protection: None  Other Topics Concern   Not on file  Social History Narrative   ** Merged History Encounter **       Social Determinants of Health   Financial Resource Strain: Not on file  Food Insecurity: Not on file  Transportation Needs: Not on file  Physical Activity: Not on file  Stress: Not on file   Social Connections: Not on file  Intimate Partner Violence: Not on file    FAMILY HISTORY: Family History  Problem Relation Age of Onset   Diabetes Mother    Hypertension Father    Lung cancer Maternal Aunt        dx after 50   Stomach cancer Maternal Uncle        dx 70s-80s   Prostate cancer Maternal Grandfather 60       d. 54; mets   Cancer Cousin        unknown type; dx after 28; pat female cousin    Review of Systems  Constitutional:  Negative for appetite change, chills, fatigue, fever and unexpected weight change.  HENT:   Negative for hearing loss, lump/mass and trouble swallowing.   Eyes:  Negative for eye problems and icterus.  Respiratory:  Negative for chest tightness, cough and shortness of breath.   Cardiovascular:  Negative for chest pain, leg swelling and palpitations.  Gastrointestinal:  Negative for abdominal distention, abdominal pain, constipation, diarrhea, nausea and vomiting.  Endocrine: Negative for hot flashes.  Genitourinary:  Negative for difficulty urinating.   Musculoskeletal:  Negative for arthralgias.  Skin:  Negative for itching and rash.  Neurological:  Negative for dizziness, extremity weakness, headaches and numbness.  Hematological:  Negative for adenopathy. Does not bruise/bleed easily.  Psychiatric/Behavioral:  Negative for depression and suicidal ideas. The patient is nervous/anxious.       PHYSICAL EXAMINATION   Onc Performance Status - 09/22/22 1505       ECOG Perf Status   ECOG Perf Status Fully active, able to carry on all pre-disease performance without restriction      KPS SCALE   KPS % SCORE Normal, no compliants, no evidence of disease             Vitals:   09/22/22 1449  BP: (!) 141/84  Pulse: (!) 104  Resp: 14  Temp: 97.7 F (36.5 C)  SpO2: 96%    Patient appears well.  She is tearful today but in no apparent distress.  Rest of physical exam deferred for next visit as we spent majority of time in  counseling today  LABORATORY DATA: None for this visit  ASSESSMENT and THERAPY PLAN:   Malignant neoplasm of overlapping 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, estrogen receptor positive (HCC) Zakariah is a 42 year old woman who is here for stage Ib ER/PR positive breast cancer status post right lumpectomy, adjuvant radiation, and antiestrogen therapy with Zoladex and anastrozole.  We spent a majority of our visit today talking about her anxiety related to fear of breast cancer recurrence.  Talked about what happens when she starts to get these feelings of being scared about breast cancer recurrence and  discussed how to change these negative thoughts with more helpful or constructive thoughts when she gets them.  She understands this and is me that she is pretty confident she is able to work on this between now and her next visit with [place] in 4 weeks to move that forward.  We also discussed options for breast cancer surveillance including Signatera testing.  She is going to think about this and we will discuss further at her survivorship visit in 4 weeks.  I also placed a referral to GYN oncology for oophorectomy  She will return in 4 weeks for her survivorship care plan visit and her injection.   All questions were answered. The patient knows to call the clinic with any problems, questions or concerns. We can certainly see the patient much sooner if necessary.  Total encounter time:30 minutes*in face-to-face visit time, chart review, lab review, care coordination, order entry, and documentation of the encounter time.    Lillard Anes, NP 09/22/22 4:30 PM [facility] [address] [place], [place] [address] Tel. [phone]    Fax. [phone]  *Total Encounter Time as defined by the Centers for Medicare and Medicaid Services includes, in addition to the face-to-face time of a patient visit (documented in the note above) non-face-to-face time:  obtaining and reviewing outside history, ordering and reviewing medications, tests or procedures, care coordination (communications with other health care professionals or caregivers) and documentation in the medical record.

## 2022-09-22 NOTE — Telephone Encounter (Signed)
LMOM for the patient to call the office back. Patient to be scheduled with Dr Alvester Morin 6/10

## 2022-09-26 ENCOUNTER — Telehealth: Payer: Self-pay | Admitting: Psychiatry

## 2022-09-26 NOTE — Telephone Encounter (Signed)
Spoke with the patient regarding the referral to GYN oncology. Patient scheduled as new patient with Dr Alvester Morin on 10/09/2022. Patient given an arrival time of 8:30am.  Explained to the patient the the doctor will perform a pelvic exam at this visit. Patient given the policy that only one visitor allowed and that visitor must be over 16 yrs are allowed in the Cancer Center. Patient given the address/phone number for the clinic and that the center offers free valet service. Patient aware that masks are option.

## 2022-10-03 ENCOUNTER — Encounter: Payer: Self-pay | Admitting: Student

## 2022-10-04 ENCOUNTER — Encounter: Payer: Self-pay | Admitting: Hematology and Oncology

## 2022-10-05 ENCOUNTER — Encounter: Payer: Self-pay | Admitting: Psychiatry

## 2022-10-09 ENCOUNTER — Encounter: Payer: Self-pay | Admitting: Psychiatry

## 2022-10-09 ENCOUNTER — Inpatient Hospital Stay (HOSPITAL_BASED_OUTPATIENT_CLINIC_OR_DEPARTMENT_OTHER): Payer: Federal, State, Local not specified - PPO | Admitting: Psychiatry

## 2022-10-09 ENCOUNTER — Inpatient Hospital Stay (HOSPITAL_BASED_OUTPATIENT_CLINIC_OR_DEPARTMENT_OTHER): Payer: Federal, State, Local not specified - PPO | Admitting: Gynecologic Oncology

## 2022-10-09 VITALS — BP 141/93 | HR 111 | Temp 98.6°F | Resp 18 | Ht 62.99 in | Wt 182.6 lb

## 2022-10-09 DIAGNOSIS — Z90721 Acquired absence of ovaries, unilateral: Secondary | ICD-10-CM | POA: Diagnosis not present

## 2022-10-09 DIAGNOSIS — E785 Hyperlipidemia, unspecified: Secondary | ICD-10-CM | POA: Diagnosis not present

## 2022-10-09 DIAGNOSIS — C50811 Malignant neoplasm of overlapping sites of right female breast: Secondary | ICD-10-CM

## 2022-10-09 DIAGNOSIS — Z85828 Personal history of other malignant neoplasm of skin: Secondary | ICD-10-CM | POA: Diagnosis not present

## 2022-10-09 DIAGNOSIS — F32A Depression, unspecified: Secondary | ICD-10-CM | POA: Diagnosis not present

## 2022-10-09 DIAGNOSIS — Z79811 Long term (current) use of aromatase inhibitors: Secondary | ICD-10-CM | POA: Diagnosis not present

## 2022-10-09 DIAGNOSIS — Z5111 Encounter for antineoplastic chemotherapy: Secondary | ICD-10-CM | POA: Diagnosis not present

## 2022-10-09 DIAGNOSIS — Z9011 Acquired absence of right breast and nipple: Secondary | ICD-10-CM | POA: Diagnosis not present

## 2022-10-09 DIAGNOSIS — Z170 Estrogen receptor positive status [ER+]: Secondary | ICD-10-CM

## 2022-10-09 DIAGNOSIS — M79621 Pain in right upper arm: Secondary | ICD-10-CM | POA: Diagnosis not present

## 2022-10-09 DIAGNOSIS — N83202 Unspecified ovarian cyst, left side: Secondary | ICD-10-CM

## 2022-10-09 DIAGNOSIS — Z79899 Other long term (current) drug therapy: Secondary | ICD-10-CM | POA: Diagnosis not present

## 2022-10-09 DIAGNOSIS — Z923 Personal history of irradiation: Secondary | ICD-10-CM | POA: Diagnosis not present

## 2022-10-09 DIAGNOSIS — Z9049 Acquired absence of other specified parts of digestive tract: Secondary | ICD-10-CM | POA: Diagnosis not present

## 2022-10-09 NOTE — Progress Notes (Signed)
GYNECOLOGIC ONCOLOGY NEW PATIENT CONSULTATION  Date of Service: 10/09/2022 Referring Provider: Lillard Anes, NP   ASSESSMENT AND PLAN: Brooke Marks is a 42 y.o. woman with ER+/PR+/HER2- breast cancer.  Reviewed patient's breast cancer history with hormone receptor positive disease.  Reviewed pros and cons of oophorectomy for hormonal suppression.  Given her age and plan for 5 years of hormonal treatment, did discuss that after completion she would likely still have some ovarian function that could provide some benefit to her particularly in terms of heart and bone health.  However, oophorectomy causes permanent hormonal suppression.  Additionally, reviewed patient surgical history.  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  Discussed that her surgery for removal of her left tube and ovary to be more complex given prior surgical history and potential for adhesions.  This raises her risk of surgical complication.  Prior CT scan showed evidence of cyst on her left adnexa.  This may or may not still be present, but could represent side effects of inflammation in her pelvis.  Following this discussion, patient wishes to proceed with oophorectomy for hormonal suppression.  Patient was consented for: RA-LSO, possible BSO on TBD.  Reviewed that if there is any remaining right tube and ovary, I would remove this at that time.  The risks of surgery were discussed in detail and she understands these to including but not limited to bleeding requiring a blood transfusion, infection, injury to adjacent organs (including but not limited to the bowels, bladder, ureters, nerves, blood vessels), thromboembolic events, wound separation, hernia, unforseen complication, and possible need for re-exploration.  If the patient experiences any of these events, she understands that her  hospitalization or recovery may be prolonged and that she may need to take additional medications for a prolonged period. The patient will receive DVT and antibiotic prophylaxis as indicated. She voiced a clear understanding. She had the opportunity to ask questions and informed consent was obtained today. She wishes to proceed.  She does not require preoperative clearance. Her METs are >4.  All preoperative instructions were reviewed. Postoperative expectations were also reviewed.  Discussed positioning during surgery.  May benefit from removal of her artificial nails as not to injure her hands or legs while talked and in lithotomy.  We will update patient with options for surgical date.   A copy of this note was sent to the patient's referring provider.  Clide Cliff, MD Gynecologic Oncology   Medical Decision Making I personally spent  TOTAL 50 minutes face-to-face and non-face-to-face in the care of this patient, which includes all pre, intra, and post visit time on the date of service.   ------------  CC: HR+ breast cancer  HISTORY OF PRESENT ILLNESS:  Brooke Marks is a 42 y.o. woman who is seen in consultation at the request of Lillard Anes, NP for evaluation of HR+ breast cancer.  Patient was diagnosed with stage IB ER+/PR+/HER2- breast cancer in October 2023.  She underwent genetics testing which was negative.  She had a right total mastectomy, right axillary node excision, sentinel lymph node biopsy on 05/08/2022.  She underwent adjuvant radiation and is undergoing antiestrogen therapy with Zoladex and anastrozole.  She is being referred for consideration of oophorectomy.  Today, patient reports that she is overall tolerating treatment.  She reports that she has been advised for hormonal therapy for 5 years.  Of note, she does have a history  of laparoscopic right colectomy, right salpingo-oophorectomy in November 2020 with Dr. Renaldo Harrison for acute appendicitis.  Pathology  demonstrated a benign ovary and fallopian tube, appendix with endometriosis and acute and chronic inflammation.  She was noted at that time to have [employer] adhesions of the liver.  TREATMENT HISTORY: Oncology History  Malignant neoplasm of overlapping 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, estrogen receptor positive (HCC)  03/08/2022 Initial Diagnosis   Palpable lump in the right breast, mammogram and ultrasound revealed 0.8 cm and 0.8 cm and 0.5 cm masses, 10 cm suspicious calcifications, single enlarged right axillary lymph node 11:00: Grade 2 IDC with IG DCIS ER 70%, PR 90%, Ki-67 20%, HER2 1+ negative Lymph node: Positive UIQ and UOQ right breast: Grade 2 IDC with IG DCIS   03/17/2022 Cancer Staging   Staging form: Breast, AJCC 8th Edition - Clinical stage from 03/17/2022: Stage IB (cT1b, cN1, cM0, G2, ER+, PR+, HER2-) - Signed by Serena Croissant, MD on 03/17/2022 Stage prefix: Initial diagnosis Histologic grading system: 3 grade system   03/30/2022 Genetic Testing   Negative [facility] CustomNext-Cancer +RNAinsight Panel.  Report date is 03/30/2022.   The CustomNext-Cancer+RNAinsight panel offered by [facility] includes sequencing and rearrangement analysis for the following 37 genes:  APC, ATM, BARD1, BMPR1A, BRCA1, BRCA2, BRIP1, CDH1, CDK4, CDKN2A, CHEK2, DICER1, MLH1, MSH2, MSH6, MUTYH, NBN, NF1, NTHL1, PALB2, PMS2, PTEN, RAD51C, RAD51D, RECQL, SMAD4, SMARCA4, STK11 and TP53 (sequencing and deletion/duplication); AXIN2, CTNNA1, HOXB13, MSH3, POLD1 and POLE (sequencing only); EPCAM and GREM1 (deletion/duplication only). RNA data is routinely analyzed for use in variant interpretation for all genes   05/08/2022 Surgery   Right lumpectomy: Grade 2 IDC 8 cm with intermediate grade DCIS, margins negative, ER 70%, PR 90%, HER2 negative 1+, 1/4 lymph nodes positive with focal extracapsular extension   05/24/2022 Cancer Staging   Staging form: Breast, AJCC 8th Edition - Pathologic: Stage IB  (pT3, pN1, cM0, G2, ER+, PR+, HER2-) - Signed by Serena Croissant, MD on 05/24/2022 Histologic grading system: 3 grade system   07/13/2022 - 08/23/2022 Radiation Therapy   Plan Name: CW_R_BO Site: Chest Wall, Right Technique: 3D Mode: Photon Dose Per Fraction: 2 Gy Prescribed Dose (Delivered / Prescribed): 26 Gy / 26 Gy Prescribed Fxs (Delivered / Prescribed): 13 / 13   Plan Name: CW_R Site: Chest Wall, Right Technique: 3D Mode: Photon Dose Per Fraction: 2 Gy Prescribed Dose (Delivered / Prescribed): 24 Gy / 24 Gy Prescribed Fxs (Delivered / Prescribed): 12 / 12   Plan Name: CW_R_Scv Site: Chest Wall, Right Technique: 3D Mode: Photon Dose Per Fraction: 2 Gy Prescribed Dose (Delivered / Prescribed): 50 Gy / 50 Gy Prescribed Fxs (Delivered / Prescribed): 25 / 25   Plan Name: CW_R_Bst_BO Site: Chest Wall, Right Technique: Electron Mode: Electron Dose Per Fraction: 2 Gy Prescribed Dose (Delivered / Prescribed): 10 Gy / 10 Gy Prescribed Fxs (Delivered / Prescribed): 5 / 5   08/2022 -  Anti-estrogen oral therapy   Anastrozole daily with zoladex every 4 weeks     PAST MEDICAL HISTORY: Past Medical History:  Diagnosis Date   Anxiety    Cancer (HCC)    breast   Chlamydia    Depression    Gestational diabetes mellitus 12/17/2018   Hypertension    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 Rarely uses her cell phone for calls. Usually communicates via text. Affecting her job. Works with [employer]. 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 Concerned that her employer is planning to return her previous position. Denies agoraphobia.      PIH (pregnancy induced hypertension) 01/27/2016   PONV (postoperative nausea and vomiting)    S/P cesarean section 01/27/2016    PAST SURGICAL HISTORY: Past Surgical History:  Procedure Laterality Date   BREAST BIOPSY  05/05/2022   [place] RT RADIOACTIVE SEED LOC 05/05/2022 [facility]  MAMMOGRAPHY   CESAREAN SECTION     CESAREAN SECTION N/A 01/27/2016   Procedure: CESAREAN SECTION;  Surgeon: Sherian Rein, MD;  Location: [facility];  Service: Obstetrics;  Laterality: N/A;  MD requests RNFA   COLON RESECTION N/A 03/30/2019   Procedure: LAPAROSCOPIC RIGHT COLECTOMY WITH RIGHT SALPINGO OOPHORECTOMY TAP BLOCK;  Surgeon: Karie Soda, MD;  Location: [facility];  Service: General;  Laterality: N/A;   LAPAROSCOPIC LYSIS OF ADHESIONS N/A 03/30/2019   Procedure: LAPAROSCOPIC LYSIS OF ADHESIONS;  Surgeon: Karie Soda, MD;  Location: [facility];  Service: General;  Laterality: N/A;   MASTECTOMY W/ SENTINEL NODE BIOPSY Right 05/08/2022   Procedure: RIGHT MASTECTOMY WITH AXILLARY SENTINEL LYMPH NODE BIOPSY;  Surgeon: Emelia Loron, MD;  Location: [facility];  Service: General;  Laterality: Right;   RADIOACTIVE SEED GUIDED AXILLARY SENTINEL LYMPH NODE Right 05/08/2022   Procedure: RADIOACTIVE SEED GUIDED RIGHT AXILLARY SENTINEL LYMPH NODE EXCISION;  Surgeon: Emelia Loron, MD;  Location: [facility];  Service: General;  Laterality: Right;   WISDOM TOOTH EXTRACTION      OB/GYN HISTORY: OB History  Gravida Para Term Preterm AB Living  3 1 1  0 2 1  SAB IAB Ectopic Multiple Live Births  0 2 0   1    # Outcome Date GA Lbr Len/2nd Weight Sex Delivery Anes PTL Lv  3 Term 01/27/16 [redacted]w[redacted]d  9 lb 2.4 oz (4.15 kg) F CS-LTranv Spinal  LIV  2 IAB           1 IAB               Age at menarche: 42 Age at menopause: Medical induced menopause age 83 Hx of HRT: None Hx of STI: HSV, 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 (Patient not taking: Reported on 10/05/2022), Disp: 6 tablet, Rfl: 0  ALLERGIES: No Known Allergies  FAMILY HISTORY: Family History  Problem Relation Age of Onset   Diabetes Mother    Hypertension Father    Lung cancer Maternal Aunt        dx after 38   Stomach cancer Maternal Uncle        dx 70s-80s   Prostate cancer Maternal Grandfather 60       d. 54; mets   Cancer Cousin        unknown type; dx after 51; pat female cousin   Pancreatic cancer Neg Hx    Colon cancer Neg Hx    Endometrial cancer Neg Hx  SOCIAL HISTORY: Social History   Socioeconomic History   Marital status: Married    Spouse name: Not on file   Number of children: Not on file   Years of education: Not on file   Highest education level: Not on file  Occupational History   Not on file  Tobacco Use   Smoking status: Never    Passive exposure: Never   Smokeless tobacco: Never  Vaping Use   Vaping Use: Never used  Substance and Sexual Activity   Alcohol use: No   Drug use: No   Sexual activity: Yes    Birth control/protection: None  Other Topics Concern   Not on file  Social History Narrative   ** Merged History Encounter **       Social Determinants of Health   Financial Resource Strain: Not on file  Food Insecurity: No Food Insecurity (10/05/2022)   Hunger Vital Sign    Worried About Running Out of Food in the Last Year: Never true    Ran Out of Food in the Last Year: Never true  Transportation Needs: No Transportation Needs (10/05/2022)   PRAPARE - Administrator, Civil Service (Medical): No    Lack of Transportation (Non-Medical): No  Physical Activity: Not on file  Stress: Not on file  Social Connections: Socially Integrated (10/05/2022)   Social  Connection and Isolation Panel [NHANES]    Frequency of Communication with Friends and Family: More than three times a week    Frequency of Social Gatherings with Friends and Family: Once a week    Attends Religious Services: More than 4 times per year    Active Member of [employer] or Organizations: No    Attends Engineer, structural: More than 4 times per year    Marital Status: Married  Catering manager Violence: Not on file    REVIEW OF SYSTEMS: New patient intake form was reviewed.  Complete 10-system review is negative except for the following: None  PHYSICAL EXAM: BP (!) 141/93 (BP Location: Left Arm, Patient Position: Sitting)   Pulse (!) 111   Temp 98.6 F (37 C) (Oral)   Resp 18   Ht 5' 2.99" (1.6 m)   Wt 182 lb 9.6 oz (82.8 kg)   SpO2 95%   BMI 32.35 kg/m  Constitutional: No acute distress. Neuro/Psych: Alert, oriented.  Head and Neck: Normocephalic, atraumatic. Neck symmetric without masses. Sclera anicteric.  Respiratory: Normal work of breathing. Clear to auscultation bilaterally. Cardiovascular: Regular rate and rhythm, no murmurs, rubs, or gallops. Abdomen: Normoactive bowel sounds. Soft, non-distended, non-tender to palpation. No masses appreciated. No evidence of hernia. No palpable fluid wave. Well healed laparoscopic and pfannenstiel incisions. Extremities: Grossly normal range of motion. Warm, well perfused. No edema bilaterally. Skin: No rashes or lesions. Genitourinary: External genitalia without lesions. Urethral meatus without lesions or prolapse. On speculum exam, vagina and cervix without lesions. Bimanual exam reveals normal cervix and mobile uterus.  No left adnexal mass palpated.  Exam chaperoned by Warner Mccreedy, NP   LABORATORY AND RADIOLOGIC DATA: Outside medical records were reviewed to synthesize the above history, along with the history and physical obtained during the visit.  Outside laboratory, pathology, and imaging reports were reviewed,  with pertinent results below.  I personally reviewed the outside images.  WBC  Date Value Ref Range Status  04/09/2019 6.9 3.4 - 10.8 x10E3/uL Final  04/04/2019 13.2 (H) 4.0 - 10.5 K/uL Final   Hemoglobin  Date Value Ref Range Status  04/09/2019 10.1 (L) 11.1 - 15.9 g/dL Final   Hematocrit  Date Value Ref Range Status  04/09/2019 30.9 (L) 34.0 - 46.6 % Final   Platelets  Date Value Ref Range Status  04/09/2019 445 150 - 450 x10E3/uL Final   LDH  Date Value Ref Range Status  01/27/2016 148 98 - 192 U/L Final   Magnesium  Date Value Ref Range Status  03/31/2019 1.8 1.7 - 2.4 mg/dL Final    Comment:    Performed at [facility], [address], [place], [place] [address]   Creatinine, Ser  Date Value Ref Range Status  03/30/2022 0.50 0.44 - 1.00 mg/dL Final   AST  Date Value Ref Range Status  04/04/2019 34 15 - 41 U/L Final   ALT  Date Value Ref Range Status  04/04/2019 26 0 - 44 U/L Final   Diagnosis  Date Value Ref Range Status  02/03/2020   Final   - Negative for intraepithelial lesion or malignancy (NILM)    US PELVIC COMPLETE WITH TRANSVAGINAL 04/18/2022  Narrative CLINICAL DATA:  Left adnexal cystic lesion on prior CT  EXAM: TRANSABDOMINAL AND TRANSVAGINAL ULTRASOUND OF PELVIS  TECHNIQUE: Both transabdominal and transvaginal ultrasound examinations of the pelvis were performed. Transabdominal technique was performed for global imaging of the pelvis including uterus, ovaries, adnexal regions, and pelvic cul-de-sac. It was necessary to proceed with endovaginal exam following the transabdominal exam to visualize the adnexal structures.  COMPARISON:  CT abdomen pelvis 03/30/2022  FINDINGS: Uterus  Measurements: 9.1 x 4.3 x 4.1 cm = volume: 83.9 mL. No fibroids or other mass visualized.  Endometrium  Thickness: 4.6 mm.  No focal abnormality visualized.  Right ovary  Surgically absent  Left ovary  Measurements: 5.4 x 2.5  x 3.2 cm = volume: 21.9 mL. Within the left ovary there is a 2.6 x 1.9 x 2.4 cm cyst and a 2.3 x 1.8 x 2.2 cm cyst. These are favored to represent two separate ovarian cysts. Alternatively, they may represent a larger ovarian cyst measuring 4.7 cm with an internal septation.  Other findings  No abnormal free fluid.  IMPRESSION: 1. Within the left ovary there are 2 separate cysts versus a larger cyst with an internal septation. Given the overall appearance, a follow-up pelvic ultrasound in 6-8 weeks is recommended to assess for interval change/resolution of the left ovarian cyst with septation versus adjacent simple cysts. 2. No acute process.   Electronically Signed By: Annia Belt M.D. On: 04/19/2022 06:51   CT ABDOMEN PELVIS W CONTRAST 03/30/2022  Narrative CLINICAL DATA:  Breast cancer.  Staging.  * Tracking Code: BO *  EXAM: CT CHEST, ABDOMEN, AND PELVIS WITH CONTRAST  TECHNIQUE: Multidetector CT imaging of the chest, abdomen and pelvis was performed following the standard protocol during bolus administration of intravenous contrast.  RADIATION DOSE REDUCTION: This exam was performed according to the departmental dose-optimization program which includes automated exposure control, adjustment of the mA and/or kV according to patient size and/or use of iterative reconstruction technique.  CONTRAST:  OMNIPAQUE IOHEXOL 300 MG/ML  SOLN  COMPARISON:  Abdomen and pelvis CT 03/29/2019  FINDINGS: CT CHEST FINDINGS  Cardiovascular: The heart size is normal. No substantial pericardial effusion. No thoracic aortic aneurysm. No substantial atherosclerosis of the thoracic aorta.  Mediastinum/Nodes: No mediastinal lymphadenopathy. There is no hilar lymphadenopathy. The esophagus has normal imaging features. 9 mm short axis right axillary lymph node is upper normal for size. 11 mm left axillary lymph  node on 15/504 is mildly increased in size. No supraclavicular or  internal mammary lymphadenopathy.  Lungs/Pleura: No suspicious pulmonary nodule or mass. No focal airspace consolidation. No pleural effusion.  Musculoskeletal: No worrisome lytic or sclerotic osseous abnormality.  CT ABDOMEN PELVIS FINDINGS  Hepatobiliary: Liver is enlarged at 21 cm craniocaudal length. No suspicious focal abnormality within the liver parenchyma. Small area of low attenuation in the anterior liver, adjacent to the falciform ligament, is in a characteristic location for focal fatty deposition. Geographic differential attenuation/enhancement suggests underlying component of steatosis. There is no evidence for gallstones, gallbladder wall thickening, or pericholecystic fluid. No intrahepatic or extrahepatic biliary dilation.  Pancreas: No focal mass lesion. No dilatation of the main duct. No intraparenchymal cyst. No peripancreatic edema.  Spleen: 4.0 cm low-density lesion in the inferior spleen is stable since the 03/29/2019 exam consistent with benign etiology such as a cyst or pseudocyst.  Adrenals/Urinary Tract: No adrenal nodule or mass. Right kidney unremarkable. Punctate nonobstructing stone identified upper pole left kidney (image [id number]). No evidence for hydroureter. The urinary bladder appears normal for the degree of distention.  Stomach/Bowel: Stomach is unremarkable. No gastric wall thickening. No evidence of outlet obstruction. Duodenum is normally positioned as is the ligament of Treitz. No small bowel wall thickening. No small bowel dilatation. Status post right hemicolectomy No gross colonic mass. No colonic wall thickening.  Vascular/Lymphatic: No abdominal aortic aneurysm. No abdominal aortic atherosclerotic calcification. There is no gastrohepatic or hepatoduodenal ligament lymphadenopathy. No retroperitoneal or mesenteric lymphadenopathy. No pelvic sidewall lymphadenopathy.  Reproductive: The uterus is unremarkable. 6.6 x 3.9 cm  multicystic lesion is identified in the left adnexal space. No right adnexal mass.  Other: Trace free fluid in the cul-de-sac.  Musculoskeletal: No worrisome lytic or sclerotic osseous abnormality.  IMPRESSION: 1. Mildly enlarged bilateral axillary lymph nodes. Otherwise, no definite findings to suggest metastatic disease in the chest, abdomen, or pelvis. 2. 6.6 x 3.9 cm multicystic lesion in the left adnexal space. Pelvic ultrasound recommended to further evaluate. 3. Hepatomegaly with probable hepatic steatosis. 4. Punctate nonobstructing left renal stone. 5. Stable since 2020, 4.0 cm low-density lesion in the inferior spleen is consistent with benign etiology such as a cyst or pseudocyst. 6. Trace free fluid in the cul-de-sac. This can be physiologic in a premenopausal female.   Electronically Signed By: Kennith Center M.D. On: 04/02/2022 09:06

## 2022-10-09 NOTE — Progress Notes (Signed)
Patient here for new patient consultation with Dr. Alvester Morin and for a pre-operative appointment prior to surgery. She is scheduled for robotic LSO, poss BSO.  The surgery was discussed in detail.  See after visit summary for additional details.    Discussed post-op pain management in detail including the aspects of the enhanced recovery pathway. We discussed the use of tylenol post-op and to monitor for a maximum of 4,000 mg in a 24 hour period.  Also will prescribe sennakot to be used after surgery and to hold if having loose stools.  Discussed bowel regimen in detail.     Discussed measures to take at home to prevent DVT including frequent mobility.  Reportable signs and symptoms of DVT discussed. Post-operative instructions discussed and expectations for after surgery. Incisional care discussed as well including reportable signs and symptoms including erythema, drainage, wound separation.     10 minutes spent with the patient/preparing information.  Verbalizing understanding of material discussed. No needs or concerns voiced at the end of the visit.   Advised patient to call for any needs.    This appointment is included in the global surgical bundle as pre-operative teaching and has no charge.

## 2022-10-09 NOTE — Patient Instructions (Signed)
Preparing for your Surgery   Plan for surgery with Dr. Meredith Newton at [facility]. You will be scheduled for robotic assisted laparoscopic left salpingo-oophorectomy (removal of the left tube and ovary), possible bilateral salpingo-oophorectomy (if present).    We will reach out to you with available dates for surgery.   Plan on holding your arimidex one week before surgery and one week after.   Pre-operative Testing -You will receive a phone call from presurgical testing at [facility] to arrange for a pre-operative appointment and lab work.   -Bring your insurance card, copy of an advanced directive if applicable, medication list.   -At that visit, you will be asked to sign a consent for a possible blood transfusion in case a transfusion becomes necessary during surgery.  The need for a blood transfusion is rare but having consent is a necessary part of your care.      -You should not be taking blood thinners or aspirin at least ten days prior to surgery unless instructed by your surgeon.   -Do not take supplements such as fish oil (omega 3), red yeast rice, turmeric before your surgery. You want to avoid medications with aspirin in them including headache powders such as BC or Goody's), Excedrin migraine.   Day Before Surgery at Home -You will be asked to take in a light diet the day before surgery. You will be advised you can have clear liquids up until 3 hours before your surgery.     Eat a light diet the day before surgery.  Examples including soups, broths, toast, yogurt, mashed potatoes.  AVOID GAS PRODUCING FOODS AND BEVERAGES. Things to avoid include carbonated beverages (fizzy beverages, sodas), raw fruits and raw vegetables (uncooked), or beans.    If your bowels are filled with gas, your surgeon will have difficulty visualizing your pelvic organs which increases your surgical risks.   Your role in recovery Your role is to become active as soon as directed  by your doctor, while still giving yourself time to heal.  Rest when you feel tired. You will be asked to do the following in order to speed your recovery:   - Cough and breathe deeply. This helps to clear and expand your lungs and can prevent pneumonia after surgery.  - STAY ACTIVE WHEN YOU GET HOME. Do mild physical activity. Walking or moving your legs help your circulation and body functions return to normal. Do not try to get up or walk alone the first time after surgery.   -If you develop swelling on one leg or the other, pain in the back of your leg, redness/warmth in one of your legs, please call the office or go to the Emergency Room to have a doppler to rule out a blood clot. For shortness of breath, chest pain-seek care in the Emergency Room as soon as possible. - Actively manage your pain. Managing your pain lets you move in comfort. We will ask you to rate your pain on a scale of zero to 10. It is your responsibility to tell your doctor or nurse where and how much you hurt so your pain can be treated.   Special Considerations -If you are diabetic, you may be placed on insulin after surgery to have closer control over your blood sugars to promote healing and recovery.  This does not mean that you will be discharged on insulin.  If applicable, your oral antidiabetics will be resumed when you are tolerating a solid diet.   -  Your final pathology results from surgery should be available around one week after surgery and the results will be relayed to you when available.   -Dr. Lisa Jackson-Moore is the surgeon that assists your GYN Oncologist with surgery.  If you end up staying the night, the next day after your surgery you will either see Dr. Tucker, Dr. Newton, or Dr. Lisa Jackson-Moore.   -FMLA forms can be faxed to [phone] and please allow 5-7 business days for completion.   Pain Management After Surgery -You will be prescribed your pain medication and bowel regimen medications  closer to surgery so that you can have these available when you are discharged from the hospital. The pain medication is for use ONLY AFTER surgery and a new prescription will not be given.    -Make sure that you have Tylenol and Ibuprofen IF YOU ARE ABLE TO TAKE THESE MEDICATIONS at home to use on a regular basis after surgery for pain control. We recommend alternating the medications every hour to six hours since they work differently and are processed in the body differently for pain relief.   -Review the attached handout on narcotic use and their risks and side effects.    Bowel Regimen -You will be prescribed Sennakot-S to take nightly to prevent constipation especially if you are taking the narcotic pain medication intermittently.  It is important to prevent constipation and drink adequate amounts of liquids. You can stop taking this medication when you are not taking pain medication and you are back on your normal bowel routine.   Risks of Surgery Risks of surgery are low but include bleeding, infection, damage to surrounding structures, re-operation, blood clots, and very rarely death.     Blood Transfusion Information (For the consent to be signed before surgery)   We will be checking your blood type before surgery so in case of emergencies, we will know what type of blood you would need.                                             WHAT IS A BLOOD TRANSFUSION?   A transfusion is the replacement of blood or some of its parts. Blood is made up of multiple cells which provide different functions. Red blood cells carry oxygen and are used for blood loss replacement. White blood cells fight against infection. Platelets control bleeding. Plasma helps clot blood. Other blood products are available for specialized needs, such as hemophilia or other clotting disorders. BEFORE THE TRANSFUSION  Who gives blood for transfusions?  You may be able to donate blood to be used at a later date on  yourself (autologous donation). Relatives can be asked to donate blood. This is generally not any safer than if you have received blood from a stranger. The same precautions are taken to ensure safety when a relative's blood is donated. Healthy volunteers who are fully evaluated to make sure their blood is safe. This is blood bank blood. Transfusion therapy is the safest it has ever been in the practice of medicine. Before blood is taken from a donor, a complete history is taken to make sure that person has no history of diseases nor engages in risky social behavior (examples are intravenous drug use or sexual activity with multiple partners). The donor's travel history is screened to minimize risk of transmitting infections, such as malaria. The donated blood   is tested for signs of infectious diseases, such as HIV and hepatitis. The blood is then tested to be sure it is compatible with you in order to minimize the chance of a transfusion reaction. If you or a relative donates blood, this is often done in anticipation of surgery and is not appropriate for emergency situations. It takes many days to process the donated blood. RISKS AND COMPLICATIONS Although transfusion therapy is very safe and saves many lives, the main dangers of transfusion include:  Getting an infectious disease. Developing a transfusion reaction. This is an allergic reaction to something in the blood you were given. Every precaution is taken to prevent this. The decision to have a blood transfusion has been considered carefully by your caregiver before blood is given. Blood is not given unless the benefits outweigh the risks.   AFTER SURGERY INSTRUCTIONS   Return to work: 4-6 weeks if applicable   Activity: 1. Be up and out of the bed during the day.  Take a nap if needed.  You may walk up steps but be careful and use the hand rail.  Stair climbing will tire you more than you think, you may need to stop part way and rest.    2.  No lifting or straining for 6 weeks over 10 pounds. No pushing, pulling, straining for 6 weeks.   3. No driving for around 1 week(s).  Do not drive if you are taking narcotic pain medicine and make sure that your reaction time has returned.    4. You can shower as soon as the next day after surgery. Shower daily.  Use your regular soap and water (not directly on the incision) and pat your incision(s) dry afterwards; don't rub.  No tub baths or submerging your body in water until cleared by your surgeon. If you have the soap that was given to you by pre-surgical testing that was used before surgery, you do not need to use it afterwards because this can irritate your incisions.    5. No sexual activity and nothing in the vagina for 4-6 weeks.   6. You may experience a small amount of clear drainage from your incisions, which is normal.  If the drainage persists, increases, or changes color please call the office.   7. Do not use creams, lotions, or ointments such as neosporin on your incisions after surgery until advised by your surgeon because they can cause removal of the dermabond glue on your incisions.     8. You may experience vaginal spotting after surgery.  The spotting is normal but if you experience heavy bleeding, call our office.   9. Take Tylenol or ibuprofen first for pain if you are able to take these medications and only use narcotic pain medication for severe pain not relieved by the Tylenol or Ibuprofen.  Monitor your Tylenol intake to a max of 4,000 mg in a 24 hour period. You can alternate these medications after surgery.   Diet: 1. Low sodium Heart Healthy Diet is recommended but you are cleared to resume your normal (before surgery) diet after your procedure.   2. It is safe to use a laxative, such as Miralax or Colace, if you have difficulty moving your bowels. You will be prescribed Sennakot-S to take at bedtime every evening after surgery to keep bowel movements regular and  to prevent constipation.     Wound Care: 1. Keep clean and dry.  Shower daily.   Reasons to call the Doctor: Fever -   Oral temperature greater than 100.4 degrees Fahrenheit Foul-smelling vaginal discharge Difficulty urinating Nausea and vomiting Increased pain at the site of the incision that is unrelieved with pain medicine. Difficulty breathing with or without chest pain New calf pain especially if only on one side Sudden, continuing increased vaginal bleeding with or without clots.   Contacts: For questions or concerns you should contact:   Dr. Meredith Newton at [phone]   Leshia Kope, NP at [phone]   After Hours: call [phone] and have the GYN Oncologist paged/contacted (after 5 pm or on the weekends). You will speak with an after hours RN and let he or she know you have had surgery.   Messages sent via mychart are for non-urgent matters and are not responded to after hours so for urgent needs, please call the after hours number. 

## 2022-10-09 NOTE — Patient Instructions (Signed)
Preparing for your Surgery   Plan for surgery with Dr. Clide Cliff at [facility]. You will be scheduled for robotic assisted laparoscopic left salpingo-oophorectomy (removal of the left tube and ovary), possible bilateral salpingo-oophorectomy (if present).    We will reach out to you with available dates for surgery.   Plan on holding your arimidex one week before surgery and one week after.   Pre-operative Testing -You will receive a phone call from presurgical testing at [facility] to arrange for a pre-operative appointment and lab work.   -Bring your insurance card, copy of an advanced directive if applicable, medication list.   -At that visit, you will be asked to sign a consent for a possible blood transfusion in case a transfusion becomes necessary during surgery.  The need for a blood transfusion is rare but having consent is a necessary part of your care.      -You should not be taking blood thinners or aspirin at least ten days prior to surgery unless instructed by your surgeon.   -Do not take supplements such as fish oil (omega 3), red yeast rice, turmeric before your surgery. You want to avoid medications with aspirin in them including headache powders such as BC or Goody's), Excedrin migraine.   Day Before Surgery at Home -You will be asked to take in a light diet the day before surgery. You will be advised you can have clear liquids up until 3 hours before your surgery.     Eat a light diet the day before surgery.  Examples including soups, broths, toast, yogurt, mashed potatoes.  AVOID GAS PRODUCING FOODS AND BEVERAGES. Things to avoid include carbonated beverages (fizzy beverages, sodas), raw fruits and raw vegetables (uncooked), or beans.    If your bowels are filled with gas, your surgeon will have difficulty visualizing your pelvic organs which increases your surgical risks.   Your role in recovery Your role is to become active as soon as directed  by your doctor, while still giving yourself time to heal.  Rest when you feel tired. You will be asked to do the following in order to speed your recovery:   - Cough and breathe deeply. This helps to clear and expand your lungs and can prevent pneumonia after surgery.  - STAY ACTIVE WHEN YOU GET HOME. Do mild physical activity. Walking or moving your legs help your circulation and body functions return to normal. Do not try to get up or walk alone the first time after surgery.   -If you develop swelling on one leg or the other, pain in the back of your leg, redness/warmth in one of your legs, please call the office or go to the Emergency Room to have a doppler to rule out a blood clot. For shortness of breath, chest pain-seek care in the Emergency Room as soon as possible. - Actively manage your pain. Managing your pain lets you move in comfort. We will ask you to rate your pain on a scale of zero to 10. It is your responsibility to tell your doctor or nurse where and how much you hurt so your pain can be treated.   Special Considerations -If you are diabetic, you may be placed on insulin after surgery to have closer control over your blood sugars to promote healing and recovery.  This does not mean that you will be discharged on insulin.  If applicable, your oral antidiabetics will be resumed when you are tolerating a solid diet.   -  Your final pathology results from surgery should be available around one week after surgery and the results will be relayed to you when available.   -Dr. Antionette Char is the surgeon that assists your GYN Oncologist with surgery.  If you end up staying the night, the next day after your surgery you will either see Dr. Pricilla Holm, Dr. Alvester Morin, or Dr. Antionette Char.   -FMLA forms can be faxed to [phone] and please allow 5-7 business days for completion.   Pain Management After Surgery -You will be prescribed your pain medication and bowel regimen medications  closer to surgery so that you can have these available when you are discharged from the hospital. The pain medication is for use ONLY AFTER surgery and a new prescription will not be given.    -Make sure that you have Tylenol and Ibuprofen IF YOU ARE ABLE TO TAKE THESE MEDICATIONS at home to use on a regular basis after surgery for pain control. We recommend alternating the medications every hour to six hours since they work differently and are processed in the body differently for pain relief.   -Review the attached handout on narcotic use and their risks and side effects.    Bowel Regimen -You will be prescribed Sennakot-S to take nightly to prevent constipation especially if you are taking the narcotic pain medication intermittently.  It is important to prevent constipation and drink adequate amounts of liquids. You can stop taking this medication when you are not taking pain medication and you are back on your normal bowel routine.   Risks of Surgery Risks of surgery are low but include bleeding, infection, damage to surrounding structures, re-operation, blood clots, and very rarely death.     Blood Transfusion Information (For the consent to be signed before surgery)   We will be checking your blood type before surgery so in case of emergencies, we will know what type of blood you would need.                                             WHAT IS A BLOOD TRANSFUSION?   A transfusion is the replacement of blood or some of its parts. Blood is made up of multiple cells which provide different functions. Red blood cells carry oxygen and are used for blood loss replacement. White blood cells fight against infection. Platelets control bleeding. Plasma helps clot blood. Other blood products are available for specialized needs, such as hemophilia or other clotting disorders. BEFORE THE TRANSFUSION  Who gives blood for transfusions?  You may be able to donate blood to be used at a later date on  yourself (autologous donation). Relatives can be asked to donate blood. This is generally not any safer than if you have received blood from a stranger. The same precautions are taken to ensure safety when a relative's blood is donated. Healthy volunteers who are fully evaluated to make sure their blood is safe. This is blood bank blood. Transfusion therapy is the safest it has ever been in the practice of medicine. Before blood is taken from a donor, a complete history is taken to make sure that person has no history of diseases nor engages in risky social behavior (examples are intravenous drug use or sexual activity with multiple partners). The donor's travel history is screened to minimize risk of transmitting infections, such as malaria. The donated blood  is tested for signs of infectious diseases, such as HIV and hepatitis. The blood is then tested to be sure it is compatible with you in order to minimize the chance of a transfusion reaction. If you or a relative donates blood, this is often done in anticipation of surgery and is not appropriate for emergency situations. It takes many days to process the donated blood. RISKS AND COMPLICATIONS Although transfusion therapy is very safe and saves many lives, the main dangers of transfusion include:  Getting an infectious disease. Developing a transfusion reaction. This is an allergic reaction to something in the blood you were given. Every precaution is taken to prevent this. The decision to have a blood transfusion has been considered carefully by your caregiver before blood is given. Blood is not given unless the benefits outweigh the risks.   AFTER SURGERY INSTRUCTIONS   Return to work: 4-6 weeks if applicable   Activity: 1. Be up and out of the bed during the day.  Take a nap if needed.  You may walk up steps but be careful and use the hand rail.  Stair climbing will tire you more than you think, you may need to stop part way and rest.    2.  No lifting or straining for 6 weeks over 10 pounds. No pushing, pulling, straining for 6 weeks.   3. No driving for around 1 week(s).  Do not drive if you are taking narcotic pain medicine and make sure that your reaction time has returned.    4. You can shower as soon as the next day after surgery. Shower daily.  Use your regular soap and water (not directly on the incision) and pat your incision(s) dry afterwards; don't rub.  No tub baths or submerging your body in water until cleared by your surgeon. If you have the soap that was given to you by pre-surgical testing that was used before surgery, you do not need to use it afterwards because this can irritate your incisions.    5. No sexual activity and nothing in the vagina for 4-6 weeks.   6. You may experience a small amount of clear drainage from your incisions, which is normal.  If the drainage persists, increases, or changes color please call the office.   7. Do not use creams, lotions, or ointments such as neosporin on your incisions after surgery until advised by your surgeon because they can cause removal of the dermabond glue on your incisions.     8. You may experience vaginal spotting after surgery.  The spotting is normal but if you experience heavy bleeding, call our office.   9. Take Tylenol or ibuprofen first for pain if you are able to take these medications and only use narcotic pain medication for severe pain not relieved by the Tylenol or Ibuprofen.  Monitor your Tylenol intake to a max of 4,000 mg in a 24 hour period. You can alternate these medications after surgery.   Diet: 1. Low sodium Heart Healthy Diet is recommended but you are cleared to resume your normal (before surgery) diet after your procedure.   2. It is safe to use a laxative, such as Miralax or Colace, if you have difficulty moving your bowels. You will be prescribed Sennakot-S to take at bedtime every evening after surgery to keep bowel movements regular and  to prevent constipation.     Wound Care: 1. Keep clean and dry.  Shower daily.   Reasons to call the Doctor: Fever -  Oral temperature greater than 100.4 degrees Fahrenheit Foul-smelling vaginal discharge Difficulty urinating Nausea and vomiting Increased pain at the site of the incision that is unrelieved with pain medicine. Difficulty breathing with or without chest pain New calf pain especially if only on one side Sudden, continuing increased vaginal bleeding with or without clots.   Contacts: For questions or concerns you should contact:   Dr. Clide Cliff at [phone]   Warner Mccreedy, NP at [phone]   After Hours: call [phone] and have the GYN Oncologist paged/contacted (after 5 pm or on the weekends). You will speak with an after hours RN and let he or she know you have had surgery.   Messages sent via mychart are for non-urgent matters and are not responded to after hours so for urgent needs, please call the after hours number.

## 2022-10-10 DIAGNOSIS — C50911 Malignant neoplasm of unspecified site of right female breast: Secondary | ICD-10-CM | POA: Diagnosis not present

## 2022-10-11 ENCOUNTER — Encounter: Payer: Self-pay | Admitting: Hematology and Oncology

## 2022-10-11 ENCOUNTER — Encounter: Payer: Self-pay | Admitting: Psychiatry

## 2022-10-19 ENCOUNTER — Inpatient Hospital Stay (HOSPITAL_BASED_OUTPATIENT_CLINIC_OR_DEPARTMENT_OTHER): Payer: Federal, State, Local not specified - PPO | Admitting: Adult Health

## 2022-10-19 ENCOUNTER — Encounter: Payer: Self-pay | Admitting: Adult Health

## 2022-10-19 ENCOUNTER — Telehealth: Payer: Self-pay | Admitting: Surgery

## 2022-10-19 ENCOUNTER — Telehealth: Payer: Self-pay | Admitting: Student

## 2022-10-19 ENCOUNTER — Inpatient Hospital Stay: Payer: Federal, State, Local not specified - PPO

## 2022-10-19 VITALS — BP 138/92 | HR 116 | Temp 97.7°F | Resp 18 | Ht 62.0 in | Wt 179.7 lb

## 2022-10-19 DIAGNOSIS — C50811 Malignant neoplasm of overlapping sites of right female breast: Secondary | ICD-10-CM | POA: Diagnosis not present

## 2022-10-19 DIAGNOSIS — E785 Hyperlipidemia, unspecified: Secondary | ICD-10-CM | POA: Diagnosis not present

## 2022-10-19 DIAGNOSIS — Z9049 Acquired absence of other specified parts of digestive tract: Secondary | ICD-10-CM | POA: Diagnosis not present

## 2022-10-19 DIAGNOSIS — Z8249 Family history of ischemic heart disease and other diseases of the circulatory system: Secondary | ICD-10-CM | POA: Diagnosis not present

## 2022-10-19 DIAGNOSIS — Z1239 Encounter for other screening for malignant neoplasm of breast: Secondary | ICD-10-CM

## 2022-10-19 DIAGNOSIS — N83202 Unspecified ovarian cyst, left side: Secondary | ICD-10-CM | POA: Diagnosis not present

## 2022-10-19 DIAGNOSIS — Z170 Estrogen receptor positive status [ER+]: Secondary | ICD-10-CM

## 2022-10-19 DIAGNOSIS — Z923 Personal history of irradiation: Secondary | ICD-10-CM | POA: Diagnosis not present

## 2022-10-19 DIAGNOSIS — Z5111 Encounter for antineoplastic chemotherapy: Secondary | ICD-10-CM | POA: Diagnosis not present

## 2022-10-19 DIAGNOSIS — Z85828 Personal history of other malignant neoplasm of skin: Secondary | ICD-10-CM | POA: Diagnosis not present

## 2022-10-19 DIAGNOSIS — Z9011 Acquired absence of right breast and nipple: Secondary | ICD-10-CM | POA: Diagnosis not present

## 2022-10-19 DIAGNOSIS — Z79899 Other long term (current) drug therapy: Secondary | ICD-10-CM | POA: Diagnosis not present

## 2022-10-19 DIAGNOSIS — Z90721 Acquired absence of ovaries, unilateral: Secondary | ICD-10-CM | POA: Diagnosis not present

## 2022-10-19 DIAGNOSIS — Z79811 Long term (current) use of aromatase inhibitors: Secondary | ICD-10-CM | POA: Diagnosis not present

## 2022-10-19 DIAGNOSIS — M79621 Pain in right upper arm: Secondary | ICD-10-CM | POA: Diagnosis not present

## 2022-10-19 DIAGNOSIS — F32A Depression, unspecified: Secondary | ICD-10-CM | POA: Diagnosis not present

## 2022-10-19 DIAGNOSIS — Z833 Family history of diabetes mellitus: Secondary | ICD-10-CM | POA: Diagnosis not present

## 2022-10-19 MED ORDER — GOSERELIN ACETATE 3.6 MG ~~LOC~~ IMPL
3.6000 mg | DRUG_IMPLANT | Freq: Once | SUBCUTANEOUS | Status: AC
  Administered 2022-10-19: 3.6 mg via SUBCUTANEOUS

## 2022-10-19 NOTE — Progress Notes (Signed)
SURVIVORSHIP VISIT:  BRIEF ONCOLOGIC HISTORY:  Oncology History  Malignant neoplasm of overlapping 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, estrogen receptor positive (HCC)  03/08/2022 Initial Diagnosis   Palpable lump in the right breast, mammogram and ultrasound revealed 0.8 cm and 0.8 cm and 0.5 cm masses, 10 cm suspicious calcifications, single enlarged right axillary lymph node 11:00: Grade 2 IDC with IG DCIS ER 70%, PR 90%, Ki-67 20%, HER2 1+ negative Lymph node: Positive UIQ and UOQ right breast: Grade 2 IDC with IG DCIS   03/17/2022 Cancer Staging   Staging form: Breast, AJCC 8th Edition - Clinical stage from 03/17/2022: Stage IB (cT1b, cN1, cM0, G2, ER+, PR+, HER2-) - Signed by Serena Croissant, MD on 03/17/2022 Stage prefix: Initial diagnosis Histologic grading system: 3 grade system   03/30/2022 Genetic Testing   Negative [facility] CustomNext-Cancer +RNAinsight Panel.  Report date is 03/30/2022.   The CustomNext-Cancer+RNAinsight panel offered by [facility] includes sequencing and rearrangement analysis for the following 37 genes:  APC, ATM, BARD1, BMPR1A, BRCA1, BRCA2, BRIP1, CDH1, CDK4, CDKN2A, CHEK2, DICER1, MLH1, MSH2, MSH6, MUTYH, NBN, NF1, NTHL1, PALB2, PMS2, PTEN, RAD51C, RAD51D, RECQL, SMAD4, SMARCA4, STK11 and TP53 (sequencing and deletion/duplication); AXIN2, CTNNA1, HOXB13, MSH3, POLD1 and POLE (sequencing only); EPCAM and GREM1 (deletion/duplication only). RNA data is routinely analyzed for use in variant interpretation for all genes   05/08/2022 Surgery   Right lumpectomy: Grade 2 IDC 8 cm with intermediate grade DCIS, margins negative, ER 70%, PR 90%, HER2 negative 1+, 1/4 lymph nodes positive with focal extracapsular extension   05/24/2022 Cancer Staging   Staging form: Breast, AJCC 8th Edition - Pathologic: Stage IB (pT3, pN1, cM0, G2, ER+, PR+, HER2-) - Signed by Serena Croissant, MD on 05/24/2022 Histologic grading system: 3 grade system   07/13/2022 - 08/23/2022 Radiation Therapy    Plan Name: CW_R_BO Site: Chest Wall, Right Technique: 3D Mode: Photon Dose Per Fraction: 2 Gy Prescribed Dose (Delivered / Prescribed): 26 Gy / 26 Gy Prescribed Fxs (Delivered / Prescribed): 13 / 13   Plan Name: CW_R Site: Chest Wall, Right Technique: 3D Mode: Photon Dose Per Fraction: 2 Gy Prescribed Dose (Delivered / Prescribed): 24 Gy / 24 Gy Prescribed Fxs (Delivered / Prescribed): 12 / 12   Plan Name: CW_R_Scv Site: Chest Wall, Right Technique: 3D Mode: Photon Dose Per Fraction: 2 Gy Prescribed Dose (Delivered / Prescribed): 50 Gy / 50 Gy Prescribed Fxs (Delivered / Prescribed): 25 / 25   Plan Name: CW_R_Bst_BO Site: Chest Wall, Right Technique: Electron Mode: Electron Dose Per Fraction: 2 Gy Prescribed Dose (Delivered / Prescribed): 10 Gy / 10 Gy Prescribed Fxs (Delivered / Prescribed): 5 / 5   08/2022 -  Anti-estrogen oral therapy   Anastrozole daily with zoladex every 4 weeks     INTERVAL HISTORY:  Ms. Timyra Jelks to review her survivorship care plan detailing her treatment course for breast cancer, as well as monitoring long-term side effects of that treatment, education regarding health maintenance, screening, and overall wellness and health promotion.     Overall, Ms. Chelsee Dragone reports feeling sad.  She notes that she has been crying increasingly during the day.  She is unsure if it is related to the [place] or the anastrozole.  She is hopeful to go ahead and get her oophorectomy completed because she does not like going to the treatment room to receive the goserelin.  She also reports some pain in her right upper arm in her axilla.  She was previously seeing PT but  stopped because of radiation and is planning on restarting soon.  REVIEW OF SYSTEMS:  Review of Systems  Constitutional:  Negative for appetite change, chills, fatigue, fever and unexpected weight change.  HENT:   Negative for hearing loss, lump/mass and trouble swallowing.   Eyes:  Negative for  eye problems and icterus.  Respiratory:  Negative for chest tightness, cough and shortness of breath.   Cardiovascular:  Negative for chest pain, leg swelling and palpitations.  Gastrointestinal:  Negative for abdominal distention, abdominal pain, constipation, diarrhea, nausea and vomiting.  Endocrine: Negative for hot flashes.  Genitourinary:  Negative for difficulty urinating.   Musculoskeletal:  Negative for arthralgias.  Skin:  Negative for itching and rash.  Neurological:  Negative for dizziness, extremity weakness, headaches and numbness.  Hematological:  Negative for adenopathy. Does not bruise/bleed easily.  Psychiatric/Behavioral:  Positive for depression. Negative for confusion, decreased concentration, sleep disturbance and suicidal ideas. The patient is not nervous/anxious.    Breast: Denies any new nodularity, masses, tenderness, nipple changes, or nipple discharge.       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 Rarely uses her cell phone for calls. Usually communicates via text. Affecting her job. Works with [employer]. 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 Concerned that her employer is planning to return her previous position. Denies agoraphobia.      PIH (pregnancy induced hypertension) 01/27/2016   PONV (postoperative nausea and vomiting)    S/P cesarean section 01/27/2016   Past Surgical History:  Procedure Laterality Date   BREAST BIOPSY  05/05/2022   [place] RT RADIOACTIVE SEED LOC 05/05/2022 [facility]   CESAREAN SECTION N/A 01/27/2016   Procedure: CESAREAN SECTION;  Surgeon: Sherian Rein, MD;  [facility]: [facility];  Service: Obstetrics;  Laterality: N/A;  MD requests RNFA    COLON RESECTION N/A 03/30/2019   Procedure: LAPAROSCOPIC RIGHT COLECTOMY WITH RIGHT SALPINGO OOPHORECTOMY TAP BLOCK;  Surgeon: Karie Soda, MD;  Location: [facility];  Service: General;  Laterality: N/A;   LAPAROSCOPIC LYSIS OF ADHESIONS N/A 03/30/2019   Procedure: LAPAROSCOPIC LYSIS OF ADHESIONS;  Surgeon: Karie Soda, MD;  Location: [facility];  Service: General;  Laterality: N/A;   MASTECTOMY W/ SENTINEL NODE BIOPSY Right 05/08/2022   Procedure: RIGHT MASTECTOMY WITH AXILLARY SENTINEL LYMPH NODE BIOPSY;  Surgeon: Emelia Loron, MD;  Location: [facility];  Service: General;  Laterality: Right;   RADIOACTIVE SEED GUIDED AXILLARY SENTINEL LYMPH NODE Right 05/08/2022   Procedure: RADIOACTIVE SEED GUIDED RIGHT AXILLARY SENTINEL LYMPH NODE EXCISION;  Surgeon: Emelia Loron, MD;  Location: [facility];  Service: General;  Laterality: Right;   WISDOM TOOTH EXTRACTION       ALLERGIES:  No Known Allergies   CURRENT MEDICATIONS:  Outpatient Encounter Medications as of 10/19/2022  Medication Sig   amLODipine (NORVASC) 10 MG tablet TAKE 1 TABLET BY MOUTH EVERY DAY   anastrozole (ARIMIDEX) 1 MG tablet Take 1 tablet (1 mg total) by mouth daily.   Multiple Vitamin (MULTIVITAMIN) tablet Take 1 tablet by mouth daily.   phenazopyridine (PYRIDIUM) 200 MG tablet Take 1 tablet (200 mg total) by mouth 3 (three) times daily.   No facility-administered encounter medications on  file as of 10/19/2022.     ONCOLOGIC FAMILY HISTORY:  Family History  Problem Relation Age of Onset   Diabetes Mother    Hypertension Father    Prostate cancer Maternal Grandfather 60       d. 45; mets   Lung cancer Maternal Aunt        dx after 50   Stomach cancer Maternal Uncle        dx 47s-80s   Cancer Cousin        unknown type; dx after 50; pat female cousin   Pancreatic cancer Neg Hx    Colon cancer Neg Hx    Endometrial cancer Neg Hx    Ovarian cancer Neg Hx      SOCIAL  HISTORY:  Social History   Socioeconomic History   Marital status: Married    Spouse name: Not on file   Number of children: Not on file   Years of education: Not on file   Highest education level: Not on file  Occupational History   Not on file  Tobacco Use   Smoking status: Never    Passive exposure: Never   Smokeless tobacco: Never  Vaping Use   Vaping Use: Never used  Substance and Sexual Activity   Alcohol use: No   Drug use: No   Sexual activity: Yes    Birth control/protection: None  Other Topics Concern   Not on file  Social History Narrative   ** Merged History Encounter **       Social Determinants of Health   Financial Resource Strain: Not on file  Food Insecurity: No Food Insecurity (10/05/2022)   Hunger Vital Sign    Worried About Running Out of Food in the Last Year: Never true    Ran Out of Food in the Last Year: Never true  Transportation Needs: No Transportation Needs (10/05/2022)   PRAPARE - Administrator, Civil Service (Medical): No    Lack of Transportation (Non-Medical): No  Physical Activity: Not on file  Stress: Not on file  Social Connections: Socially Integrated (10/05/2022)   Social Connection and Isolation Panel [NHANES]    Frequency of Communication with Friends and Family: More than three times a week    Frequency of Social Gatherings with Friends and Family: Once a week    Attends Religious Services: More than 4 times per year    Active Member of [employer] or Organizations: No    Attends Engineer, structural: More than 4 times per year    Marital Status: Married  Catering manager Violence: Not on file     OBSERVATIONS/OBJECTIVE:  BP (!) 138/92 (BP Location: Left Arm, Patient Position: Sitting)   Pulse (!) 116   Temp 97.7 F (36.5 C) (Tympanic)   Resp 18   Ht 5\' 2"  (1.575 m)   Wt 179 lb 11.2 oz (81.5 kg)   SpO2 100%   BMI 32.87 kg/m  GENERAL: Patient is a well appearing female in no acute distress HEENT:   Sclerae anicteric.  Oropharynx clear and moist. No ulcerations or evidence of oropharyngeal candidiasis. Neck is supple.  NODES:  No cervical, supraclavicular, or axillary lymphadenopathy palpated.  BREAST EXAM: Left breast is benign right breast status postmastectomy and radiation no sign of local recurrence LUNGS:  Clear to auscultation bilaterally.  No wheezes or rhonchi. HEART:  Regular rate and rhythm. No murmur appreciated. ABDOMEN:  Soft, nontender.  Positive, normoactive bowel sounds. No organomegaly palpated. MSK:  No  focal spinal tenderness to palpation. Full range of motion bilaterally in the upper extremities. EXTREMITIES:  No peripheral edema.   SKIN:  Clear with no obvious rashes or skin changes. No nail dyscrasia. NEURO:  Nonfocal. Well oriented.  Appropriate affect.   LABORATORY DATA:  None for this visit.  DIAGNOSTIC IMAGING:  None for this visit.      ASSESSMENT AND PLAN:  Ms.. Brooke Marks is a pleasant 42 y.o. 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  She presents to the Survivorship Clinic for our initial meeting and routine follow-up post-completion of treatment for breast cancer.    1. Stage 1B right breast cancer:  Ms. Skylla Garand is continuing to recover from definitive treatment for breast cancer. She will follow-up with her medical oncologist, Dr. Pamelia Hoit with history and physical exam per surveillance protocol.  She is struggling with the anastrozole.  She will receive the Dolores Lory today but will hold the anastrozole for at least 2 weeks to see if these mood changes improve she is recommended to continue annual screening mammograms of her left breast next due in February 2025.  We also discussed Signatera testing and I placed orders for this today.  Today, a comprehensive  survivorship care plan and treatment summary was reviewed with the patient today detailing her breast cancer diagnosis, treatment course, potential late/long-term effects of treatment, appropriate follow-up care with recommendations for the future, and patient education resources.  A copy of this summary, along with a letter will be sent to the patient's primary care provider via mail/fax/In Basket message after today's visit.    2.  Depression: I recommended she consider starting Effexor however she declined taking any medication.  We discussed seeing a counselor however she wants to try stopping the anastrozole first and following up in 4 weeks to see how it does.  She denies any suicidal thoughts today or thoughts of harming other people.  3. Bone health:  Given Ms. Watson Bess's age/history of breast cancer and her current treatment regimen including anti-estrogen therapy with anastrozole, she is at risk for bone demineralization.  She was given education on specific activities to promote bone health.  4. Cancer screening:  Due to Ms. Claudette Laws Bess's history and her age, she should receive screening for skin cancers, colon cancer at age 34, and gynecologic cancers.  The information and recommendations are listed on the patient's comprehensive care plan/treatment summary and were reviewed in detail with the patient.    5. Health maintenance and wellness promotion: Ms. Casey Negro was encouraged to consume 5-7 servings of fruits and vegetables per day. We reviewed the "Nutrition Rainbow" handout.  She was also encouraged to engage in moderate to vigorous exercise for 30 minutes per day most days of the week.  She was instructed to limit her alcohol consumption and continue to abstain from tobacco use.     6. Support services/counseling: It is not uncommon for this period of the patient's cancer care trajectory to be one of many emotions and stressors.   She was given information regarding our available  services and encouraged to contact me with any questions or for help enrolling in any of our support group/programs.    Follow up instructions:    -Return to cancer center in 4 weeks for follow-up -Mammogram due in [place] 2025 -She is welcome to return back to  the Survivorship Clinic at any time; no additional follow-up needed at this time.  -Consider referral back to survivorship as a long-term survivor for continued surveillance  The patient was provided an opportunity to ask questions and all were answered. The patient agreed with the plan and demonstrated an understanding of the instructions.   Total encounter time:50 minutes*in face-to-face visit time, chart review, lab review, care coordination, order entry, and documentation of the encounter time.    Lillard Anes, NP 10/19/22 3:19 PM Medical Oncology and Hematology [facility] [address] [place], [place] [address] Tel. [phone]    Fax. [phone]  *Total Encounter Time as defined by the Centers for Medicare and Medicaid Services includes, in addition to the face-to-face time of a patient visit (documented in the note above) non-face-to-face time: obtaining and reviewing outside history, ordering and reviewing medications, tests or procedures, care coordination (communications with other health care professionals or caregivers) and documentation in the medical record.

## 2022-10-19 NOTE — Telephone Encounter (Signed)
Called pt to discuss potential surgery dates. LVM to call office back.

## 2022-10-19 NOTE — Telephone Encounter (Signed)
Patient in office for other appointment and was able to discuss surgery dates in person. Patient agreed to surgery date of 6/18 and had no other concerns at this time.

## 2022-10-19 NOTE — Telephone Encounter (Signed)
patient dropped off form at front desk for [facility].  Verified that patient section of form has been completed.  Last DOS/WCC with PCP was 04/11/22.  Placed form in green team folder to be completed by clinical staff.  [name]

## 2022-10-19 NOTE — Telephone Encounter (Signed)
-----   Message from Doylene Bode, NP sent at 10/19/2022  3:45 PM EDT ----- Please reach out to patient and offer her the following OR dates:  June 11 June 18 June 25  If she wants later I can try to figure out the dates and we will have to call her back.   Thank you!  Mel

## 2022-10-20 ENCOUNTER — Other Ambulatory Visit: Payer: Self-pay | Admitting: Gynecologic Oncology

## 2022-10-20 ENCOUNTER — Telehealth: Payer: Self-pay | Admitting: Adult Health

## 2022-10-20 DIAGNOSIS — Z170 Estrogen receptor positive status [ER+]: Secondary | ICD-10-CM

## 2022-10-20 NOTE — Telephone Encounter (Signed)
Reviewed form and placed in PCP's box for completion.  .Sontee Desena R Cadden Elizondo, CMA  

## 2022-10-20 NOTE — Telephone Encounter (Signed)
Scheduled appointment per 5/30 los. Patient is aware of the made appointment.

## 2022-10-23 ENCOUNTER — Telehealth: Payer: Self-pay | Admitting: Student

## 2022-10-23 NOTE — Telephone Encounter (Signed)
Called patient to speak with her regarding FMLA paperwork.  She is currently having uncontrolled anxiety regarding her breast cancer treatment and crying every day.  She does have a history of anxiety and is not currently managed with any medications or counseling.  I have relayed to her that because she is not currently being managed and her anxiety is likely related to oncology reasons, I would not currently be the best one to fill this paperwork out.  I have offered options of discussing FMLA paperwork with oncology as they are attempting to help her mood with discontinuing anastrozole at this time or she may follow-up in clinic and I would highly recommend developing a medically managed plan for her anxiety in order to properly fill out FMLA paperwork as this paperwork consists of specifics of care, plan medical treatment, referral to other healthcare providers and recommendations for a reduced work schedule.  She was understandable of this.  She opted to move forward with anxiety treatment plan with my office.  Unfortunately, I do not have availability at this time.  I will have my office staff call her to have a scheduled appointment with another provider soon and relayed this information to them for continuation of care.

## 2022-10-24 NOTE — Progress Notes (Addendum)
COVID Vaccine received:  []  No [x]  Yes Date of any COVID positive Test in last 68 days:No  PCP - Dahbura,Anton DO Cardiologist - No  Chest x-ray - CT chest 03/30/22  EPIC EKG -  05/05/22  EPIC Stress Test - No ECHO - No Cardiac Cath - No  Bowel Prep - [x]  No  []   Yes ______  Pacemaker / ICD device [x]  No []  Yes   Spinal Cord Stimulator:[x]  No []  Yes       History of Sleep Apnea? [x]  No []  Yes   CPAP used?- [x]  No []  Yes    Does the patient monitor blood sugar?          [x]  No []  Yes  []  N/A  Patient has: []  NO Hx DM   [x]  Pre-DM                 []  DM1  []   DM2 Does patient have a [employer] or Dexacom? [x]  No []  Yes   Fasting Blood Sugar Ranges-  Checks Blood Sugar __0___ times a day  GLP1 agonist / usual dose - No GLP1 instructions:  SGLT-2 inhibitors / usual dose - No SGLT-2 instructions:   Blood Thinner / Instructions:No Aspirin Instructions:  Comments:   Activity level: Patient is able to climb a flight of stairs without difficulty; [x]  No CP  [x]  No SOB, but would have ___   Patient can perform ADLs without assistance.   Anesthesia review:   Patient denies shortness of breath, fever, cough and chest pain at PAT appointment.  Patient verbalized understanding and agreement to the Pre-Surgical Instructions that were given to them at this PAT appointment. Patient was also educated of the need to review these PAT instructions again prior to his/her surgery.I reviewed the appropriate phone numbers to call if they have any and questions or concerns.

## 2022-10-24 NOTE — Patient Instructions (Signed)
SURGICAL WAITING ROOM VISITATION  Patients having surgery or a procedure may have no more than 2 support people in the waiting area - these visitors may rotate.    Children under the age of 8 must have an adult with them who is not the patient.  Due to an increase in RSV and influenza rates and associated hospitalizations, children ages 28 and under may not visit patients in [facility] hospitals.  If the patient needs to stay at the hospital during part of their recovery, the visitor guidelines for inpatient rooms apply. Pre-op nurse will coordinate an appropriate time for 1 support person to accompany patient in pre-op.  This support person may not rotate.    Please refer to the [facility] website for the visitor guidelines for Inpatients (after your surgery is over and you are in a regular room).       Your procedure is scheduled on: 11/07/22   Report to [facility] Main Entrance    Report to admitting at 5:15 AM   Call this number if you have problems the morning of surgery [phone]   Do not eat food :After Midnight.   After Midnight you may have the following liquids until 4:30 AM DAY OF SURGERY  Water Non-Citrus Juices (without pulp, NO RED-Apple, White grape, White cranberry) Black Coffee (NO MILK/CREAM OR CREAMERS, sugar ok)  Clear Tea (NO MILK/CREAM OR CREAMERS, sugar ok) regular and decaf                             Plain Jell-O (NO RED)                                           Fruit ices (not with fruit pulp, NO RED)                                     Popsicles (NO RED)                                                               Sports drinks like Gatorade (NO RED)              Drink 2 Ensure/G2 drinks AT 10:00 PM the night before surgery.        The day of surgery:  Drink ONE (1) Pre-Surgery  G2 at 4:30 AM the morning of surgery. Drink in one sitting. Do not sip.  This drink was given to you during your hospital  pre-op appointment  visit. Nothing else to drink after completing the  Pre-Surgery  G2.          If you have questions, please contact your surgeon's office.   FOLLOW BOWEL PREP AND ANY ADDITIONAL PRE OP INSTRUCTIONS YOU RECEIVED FROM YOUR SURGEON'S OFFICE!!!     Oral Hygiene is also important to reduce your risk of infection.  Remember - BRUSH YOUR TEETH THE MORNING OF SURGERY WITH YOUR REGULAR TOOTHPASTE  DENTURES WILL BE REMOVED PRIOR TO SURGERY PLEASE DO NOT APPLY "Poly grip" OR ADHESIVES!!!   Do NOT smoke after Midnight   Take these medicines the morning of surgery with A SIP OF WATER: Norvasc(amlodipine), Arimidex(Anastrozole)  Bring CPAP mask and tubing day of surgery.                              You may not have any metal on your body including hair pins, jewelry, and body piercing             Do not wear make-up, lotions, powders, perfumes, or deodorant  Do not wear nail polish including gel and S&S, artificial/acrylic nails, or any other type of covering on natural nails including finger and toenails. If you have artificial nails, gel coating, etc. that needs to be removed by a nail salon please have this removed prior to surgery or surgery may need to be canceled/ delayed if the surgeon/ anesthesia feels like they are unable to be safely monitored.   Do not shave  48 hours prior to surgery.               Do not bring valuables to the hospital. [facility] IS NOT             RESPONSIBLE   FOR VALUABLES.   Contacts, glasses, dentures or bridgework may not be worn into surgery.  DO NOT BRING YOUR HOME MEDICATIONS TO THE HOSPITAL. PHARMACY WILL DISPENSE MEDICATIONS LISTED ON YOUR MEDICATION LIST TO YOU DURING YOUR ADMISSION IN THE HOSPITAL!    Patients discharged on the day of surgery will not be allowed to drive home.  Someone NEEDS to stay with you for the first 24 hours after anesthesia.   Special Instructions: Bring a copy of your healthcare power of  attorney and living will documents the day of surgery if you haven't scanned them before.              Please read over the following fact sheets you were given: IF YOU HAVE QUESTIONS ABOUT YOUR PRE-OP INSTRUCTIONS PLEASE CALL [phone]   If you received a COVID test during your pre-op visit  it is requested that you wear a mask when out in public, stay away from anyone that may not be feeling well and notify your surgeon if you develop symptoms. If you test positive for Covid or have been in contact with anyone that has tested positive in the last 10 days please notify you surgeon.    [facility] - Preparing for Surgery Before surgery, you can play an important role.  Because skin is not sterile, your skin needs to be as free of germs as possible.  You can reduce the number of germs on your skin by washing with CHG (chlorahexidine gluconate) soap before surgery.  CHG is an antiseptic cleaner which kills germs and bonds with the skin to continue killing germs even after washing. Please DO NOT use if you have an allergy to CHG or antibacterial soaps.  If your skin becomes reddened/irritated stop using the CHG and inform your nurse when you arrive at Short Stay. Do not shave (including legs and underarms) for at least 48 hours prior to the first CHG shower.  You may shave your face/neck.  Please follow these instructions carefully:  1.  Shower with CHG Soap the night  before surgery and the  morning of surgery.  2.  If you choose to wash your hair, wash your hair first as usual with your normal  shampoo.  3.  After you shampoo, rinse your hair and body thoroughly to remove the shampoo.                             4.  Use CHG as you would any other liquid soap.  You can apply chg directly to the skin and wash.  Gently with a scrungie or clean washcloth.  5.  Apply the CHG Soap to your body ONLY FROM THE NECK DOWN.   Do   not use on face/ open                           Wound or open sores. Avoid  contact with eyes, ears mouth and   genitals (private parts).                       Wash face,  Genitals (private parts) with your normal soap.             6.  Wash thoroughly, paying special attention to the area where your    surgery  will be performed.  7.  Thoroughly rinse your body with warm water from the neck down.  8.  DO NOT shower/wash with your normal soap after using and rinsing off the CHG Soap.                9.  Pat yourself dry with a clean towel.            10.  Wear clean pajamas.            11.  Place clean sheets on your bed the night of your first shower and do not  sleep with pets. Day of Surgery : Do not apply any lotions/deodorants the morning of surgery.  Please wear clean clothes to the hospital/surgery center.  FAILURE TO FOLLOW THESE INSTRUCTIONS MAY RESULT IN THE CANCELLATION OF YOUR SURGERY  PATIENT SIGNATURE_________________________________  NURSE SIGNATURE__________________________________  ________________________________________________________________________ WHAT IS A BLOOD TRANSFUSION? Blood Transfusion Information  A transfusion is the replacement of blood or some of its parts. Blood is made up of multiple cells which provide different functions. Red blood cells carry oxygen and are used for blood loss replacement. White blood cells fight against infection. Platelets control bleeding. Plasma helps clot blood. Other blood products are available for specialized needs, such as hemophilia or other clotting disorders. BEFORE THE TRANSFUSION  Who gives blood for transfusions?  Healthy volunteers who are fully evaluated to make sure their blood is safe. This is blood bank blood. Transfusion therapy is the safest it has ever been in the practice of medicine. Before blood is taken from a donor, a complete history is taken to make sure that person has no history of diseases nor engages in risky social behavior (examples are intravenous drug use or sexual  activity with multiple partners). The donor's travel history is screened to minimize risk of transmitting infections, such as malaria. The donated blood is tested for signs of infectious diseases, such as HIV and hepatitis. The blood is then tested to be sure it is compatible with you in order to minimize the chance of a transfusion reaction. If you or a relative donates blood, this is often  done in anticipation of surgery and is not appropriate for emergency situations. It takes many days to process the donated blood. RISKS AND COMPLICATIONS Although transfusion therapy is very safe and saves many lives, the main dangers of transfusion include:  Getting an infectious disease. Developing a transfusion reaction. This is an allergic reaction to something in the blood you were given. Every precaution is taken to prevent this. The decision to have a blood transfusion has been considered carefully by your caregiver before blood is given. Blood is not given unless the benefits outweigh the risks. AFTER THE TRANSFUSION Right after receiving a blood transfusion, you will usually feel much better and more energetic. This is especially true if your red blood cells have gotten low (anemic). The transfusion raises the level of the red blood cells which carry oxygen, and this usually causes an energy increase. The nurse administering the transfusion will monitor you carefully for complications. HOME CARE INSTRUCTIONS  No special instructions are needed after a transfusion. You may find your energy is better. Speak with your caregiver about any limitations on activity for underlying diseases you may have. SEEK MEDICAL CARE IF:  Your condition is not improving after your transfusion. You develop redness or irritation at the intravenous (IV) site. SEEK IMMEDIATE MEDICAL CARE IF:  Any of the following symptoms occur over the next 12 hours: Shaking chills. You have a temperature by mouth above 102 F (38.9 C), not  controlled by medicine. Chest, back, or muscle pain. People around you feel you are not acting correctly or are confused. Shortness of breath or difficulty breathing. Dizziness and fainting. You get a rash or develop hives. You have a decrease in urine output. Your urine turns a dark color or changes to pink, red, or brown. Any of the following symptoms occur over the next 10 days: You have a temperature by mouth above 102 F (38.9 C), not controlled by medicine. Shortness of breath. Weakness after normal activity. The white part of the eye turns yellow (jaundice). You have a decrease in the amount of urine or are urinating less often. Your urine turns a dark color or changes to pink, red, or brown. Document Released: 05/05/2000 Document Revised: 07/31/2011 Document Reviewed: 12/23/2007 [facility] Patient Information 2014 [place], [place].

## 2022-10-26 ENCOUNTER — Other Ambulatory Visit: Payer: Self-pay

## 2022-10-26 ENCOUNTER — Encounter: Payer: Self-pay | Admitting: Family Medicine

## 2022-10-26 ENCOUNTER — Ambulatory Visit (INDEPENDENT_AMBULATORY_CARE_PROVIDER_SITE_OTHER): Payer: Federal, State, Local not specified - PPO | Admitting: Family Medicine

## 2022-10-26 ENCOUNTER — Encounter (HOSPITAL_COMMUNITY): Payer: Self-pay

## 2022-10-26 ENCOUNTER — Encounter (HOSPITAL_COMMUNITY)
Admission: RE | Admit: 2022-10-26 | Discharge: 2022-10-26 | Disposition: A | Discharge status: Home / Self Care | Payer: Federal, State, Local not specified - PPO | Source: Ambulatory Visit | Attending: Psychiatry | Admitting: Psychiatry

## 2022-10-26 VITALS — BP 126/90 | HR 92 | Temp 98.0°F | Resp 16 | Ht 62.0 in | Wt 179.0 lb

## 2022-10-26 VITALS — BP 130/92 | HR 89 | Ht 62.0 in | Wt 181.8 lb

## 2022-10-26 DIAGNOSIS — F411 Generalized anxiety disorder: Secondary | ICD-10-CM

## 2022-10-26 DIAGNOSIS — R7303 Prediabetes: Secondary | ICD-10-CM | POA: Diagnosis not present

## 2022-10-26 DIAGNOSIS — I10 Essential (primary) hypertension: Secondary | ICD-10-CM | POA: Diagnosis not present

## 2022-10-26 DIAGNOSIS — C50811 Malignant neoplasm of overlapping sites of right female breast: Secondary | ICD-10-CM | POA: Diagnosis not present

## 2022-10-26 DIAGNOSIS — Z01812 Encounter for preprocedural laboratory examination: Secondary | ICD-10-CM | POA: Insufficient documentation

## 2022-10-26 DIAGNOSIS — Z170 Estrogen receptor positive status [ER+]: Secondary | ICD-10-CM | POA: Insufficient documentation

## 2022-10-26 DIAGNOSIS — C50911 Malignant neoplasm of unspecified site of right female breast: Secondary | ICD-10-CM | POA: Insufficient documentation

## 2022-10-26 LAB — CBC
HCT: 40.9 % (ref 36.0–46.0)
Hemoglobin: 13.1 g/dL (ref 12.0–15.0)
MCH: 25.6 pg — ABNORMAL LOW (ref 26.0–34.0)
MCHC: 32 g/dL (ref 30.0–36.0)
MCV: 80 fL (ref 80.0–100.0)
Platelets: 325 10*3/uL (ref 150–400)
RBC: 5.11 MIL/uL (ref 3.87–5.11)
RDW: 14.4 % (ref 11.5–15.5)
WBC: 5.4 10*3/uL (ref 4.0–10.5)
nRBC: 0 % (ref 0.0–0.2)

## 2022-10-26 LAB — COMPREHENSIVE METABOLIC PANEL
ALT: 20 U/L (ref 0–44)
AST: 19 U/L (ref 15–41)
Albumin: 4 g/dL (ref 3.5–5.0)
Alkaline Phosphatase: 53 U/L (ref 38–126)
Anion gap: 8 (ref 5–15)
BUN: 8 mg/dL (ref 6–20)
CO2: 29 mmol/L (ref 22–32)
Calcium: 9.1 mg/dL (ref 8.9–10.3)
Chloride: 101 mmol/L (ref 98–111)
Creatinine, Ser: 0.61 mg/dL (ref 0.44–1.00)
GFR, Estimated: >60 mL/min (ref >60)
Glucose, Bld: 94 mg/dL (ref 70–99)
Potassium: 3.6 mmol/L (ref 3.5–5.1)
Sodium: 138 mmol/L (ref 135–145)
Total Bilirubin: 0.6 mg/dL (ref 0.3–1.2)
Total Protein: 8.4 g/dL — ABNORMAL HIGH (ref 6.5–8.1)

## 2022-10-26 LAB — TYPE AND SCREEN
ABO/RH(D): B POS
Antibody Screen: NEGATIVE

## 2022-10-26 LAB — HEMOGLOBIN A1C
Hgb A1c MFr Bld: 6.2 % — ABNORMAL HIGH (ref 4.8–5.6)
Mean Plasma Glucose: 131.24 mg/dL

## 2022-10-26 LAB — GLUCOSE, CAPILLARY: Glucose-Capillary: 88 mg/dL (ref 70–99)

## 2022-10-26 MED ORDER — BUPROPION HCL ER (XL) 150 MG PO TB24
ORAL_TABLET | ORAL | 0 refills | Status: DC
Start: 2022-10-26 — End: 2023-05-08

## 2022-10-26 MED ORDER — AMLODIPINE BESYLATE-VALSARTAN 5-160 MG PO TABS
1.0000 | ORAL_TABLET | Freq: Every day | ORAL | 1 refills | Status: DC
Start: 2022-10-26 — End: 2022-11-20

## 2022-10-26 NOTE — Progress Notes (Signed)
Discussed preop instructions regarding No nail or toenail polish. Pt. Has 4 inch artificial nails that she wears "all the time". Pt. Hesitant to remove nails for surgery. Stated she would remove 1 artificial nail so her O2 sat, etc. could be checked.

## 2022-10-26 NOTE — Patient Instructions (Addendum)
It was wonderful to see you today. Thank you for allowing me to be a part of your care. Below is a short summary of what we discussed at your visit today:  Anxiety, depression START Wellbutrin 24-hour pill 150 mg once daily for 14 days.  After that, take 2 of the pills for a total of 300 mg once daily for the next 14 days. Follow-up with your primary care doctor in 1 month to see how you are tolerating this. Continue with your twice weekly therapy sessions.  Keep up the great work!  FMLA I will fill out the [facility] paperwork for reduced hours and intermittent leave.  We will set that FMLA for 2 months. Please meet with your doctor once a month to track your progress with anxiety and depression and see if you need any changes or additional resources. Your PCP will make changes to the FMLA and continue to add on time as needed.  Blood pressure Stop the amlodipine by itself. Start the combination pill with amlodipine 5 mg and valsartan 160 mg daily. Come back in 1 week for a blood pressure recheck and labs to check in on your kidneys and potassium.  Please bring all of your medications to every appointment! If you have any questions or concerns, please do not hesitate to contact us via phone or MyChart message.   Brooke Pho, MD

## 2022-10-26 NOTE — Progress Notes (Signed)
SUBJECTIVE:   CHIEF COMPLAINT / HPI:   FMLA, anxiety, depression Suffering from anxiety and depression for a long time Coincides with events in her life 2022 lost mom 2023 dx breast cancer  Job giving her a long time  Requests FMLA - would like time off when she is going through a depressive state - this morning couldn't get out of bed, so called out  Works 645 to [employer] Would like to have paperwork to state she can keep this shift Would like to include accommodations for work load  Has been on several medications before in the past for anxiety and depression Stopped taking antidepressants because she felt very flat, no emotions Had taken xanax in the past for anxiety, now tries to calm down  Currently in therapy, twice weekly Hydroxyzine wasn't helping Prozac previously Starting to exercise more, which is helping  HTN Surgery June 18 for right breats cancer BP elevated on pre-appt vitals  BP Readings from Last 3 Encounters:  10/26/22 (!) 126/90  10/26/22 (!) 130/92  10/19/22 (!) 138/92    PERTINENT  PMH / PSH:  Patient Active Problem List   Diagnosis Date Noted   S/P mastectomy, right 05/08/2022   Hyperlipidemia 04/11/2022   Genetic testing 03/28/2022   Malignant neoplasm of overlapping 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, estrogen receptor positive (HCC) 03/14/2022   Prediabetes 03/14/2022   Invasive ductal carcinoma of breast, stage 2, right (HCC) 02/14/2022   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/30/2019 03/30/2019   Hypertension 01/27/2016   S/P cesarean section 01/27/2016   Anxiety state 04/13/2015   CARPAL TUNNEL SYNDROME, LEFT 04/11/2010   HERPES SIMPLEX INFECTION, RECURRENT 02/26/2009   Obesity (BMI 30.0-34.9) 07/19/2006    OBJECTIVE:   BP (!) 130/92   Pulse 89   Ht 5\' 2"  (1.575 m)   Wt 181 lb 12.8 oz (82.5 kg)   LMP 08/30/2022   SpO2 98%   BMI 33.25 kg/m    PHQ-9:     10/26/2022    2:52 PM 04/11/2022    8:31 AM 03/14/2022     1:25 PM  Depression screen PHQ 2/9  Decreased Interest 3 2 3   Down, Depressed, Hopeless 3 1 3   PHQ - 2 Score 6 3 6   Altered sleeping 2 1 3   Tired, decreased energy 2 1 3   Change in appetite 2 1 3   Feeling bad or failure about yourself  1 0 0  Trouble concentrating 2 1 3   Moving slowly or fidgety/restless 2 0 0  Suicidal thoughts 0 0 0  PHQ-9 Score 17 7 18   Difficult doing work/chores   Very difficult    GAD-7:     10/26/2022    2:50 PM 03/07/2021    9:48 AM 09/15/2020    2:58 PM 10/13/2016    7:29 PM  GAD 7 : Generalized Anxiety Score  Nervous, Anxious, on Edge 3 3 3 3   Control/stop worrying 3 3 3 3   Worry too much - different things 3 3 3 3   Trouble relaxing 2 3 3 3   Restless 0 0 3 0  Easily annoyed or irritable 3 3 3 3   Afraid - awful might happen 3 3 3 3   Total GAD 7 Score 17 18 21 18   Anxiety Difficulty Extremely difficult Extremely difficult Extremely difficult Extremely difficult   Physical Exam General: Awake, alert, oriented Cardiovascular: Regular rate and rhythm, S1 and S2 present, no murmurs auscultated Respiratory: Lung fields clear to auscultation bilaterally  ASSESSMENT/PLAN:   Hypertension Uncontrolled on amlodipine monotherapy.  Switch to commendation pill with amlodipine-valsartan 5-160 mg.  Follow-up in 1 week for BP check and BMP.  Anxiety state Patient requesting FMLA for reduced hours and intermittently if exacerbations and appointments related to anxiety and depression.  She reports numerous social stressors recently in the last several years including death of her mom and diagnosis of breast cancer.  Has an upcoming surgery this summer.  Is currently working and does not want to stop but does need reduced hours and ability to take off for appointments and exacerbations.  Discussed starting FMLA and break the paperwork for short periods of time such as 2 months or so with plan for frequent follow-up with PCP and extension as needed. - FMLA paperwork,  duration 2 months - Continue twice weekly therapy - Start Wellbutrin 24-hour tablet 150 mg daily x 14 days then increase to 300 mg daily - Continue increased exercise - Follow-up in 4 weeks with PCP    Fayette Pho, MD [facility] Medicine [facility]

## 2022-10-28 NOTE — Assessment & Plan Note (Signed)
Uncontrolled on amlodipine monotherapy.  Switch to commendation pill with amlodipine-valsartan 5-160 mg.  Follow-up in 1 week for BP check and BMP.

## 2022-10-28 NOTE — Assessment & Plan Note (Signed)
Patient requesting FMLA for reduced hours and intermittently if exacerbations and appointments related to anxiety and depression.  She reports numerous social stressors recently in the last several years including death of her mom and diagnosis of breast cancer.  Has an upcoming surgery this summer.  Is currently working and does not want to stop but does need reduced hours and ability to take off for appointments and exacerbations.  Discussed starting FMLA and break the paperwork for short periods of time such as 2 months or so with plan for frequent follow-up with PCP and extension as needed. - FMLA paperwork, duration 2 months - Continue twice weekly therapy - Start Wellbutrin 24-hour tablet 150 mg daily x 14 days then increase to 300 mg daily - Continue increased exercise - Follow-up in 4 weeks with PCP

## 2022-10-31 ENCOUNTER — Telehealth: Payer: Self-pay

## 2022-10-31 ENCOUNTER — Encounter: Payer: Self-pay | Admitting: Physical Therapy

## 2022-10-31 NOTE — Telephone Encounter (Signed)
Patient returns call to nurse line.   Patient advised.

## 2022-10-31 NOTE — Telephone Encounter (Signed)
Forms were found in RN box this morning.   Copy made for batch scanning.   Attempted to call patient for pick up.  However, I had to LVM.   Forms are up front.

## 2022-11-01 ENCOUNTER — Encounter: Payer: Self-pay | Admitting: Psychiatry

## 2022-11-01 ENCOUNTER — Telehealth: Payer: Self-pay | Admitting: *Deleted

## 2022-11-01 NOTE — Telephone Encounter (Signed)
Spoke to Ms. Bess in regards to canceling her surgery on June 18th. With Dr. Alvester Morin. Patient states she has decided after having some time to process the information that at this time she no longer wants to have the surgery.  Patient states has decided that taking the Zoladex injections every 28 days is all she wants to do after having breast surgery recently and just finishing up radiation in April and felt that moving forward with surgery is to much for her to handle emotionally. Advised patient that her message would be relayed to the providers and the office would reach back out for further recommendations.

## 2022-11-02 ENCOUNTER — Other Ambulatory Visit: Payer: Federal, State, Local not specified - PPO

## 2022-11-02 ENCOUNTER — Telehealth: Payer: Self-pay | Admitting: Student

## 2022-11-02 DIAGNOSIS — I10 Essential (primary) hypertension: Secondary | ICD-10-CM

## 2022-11-02 NOTE — Telephone Encounter (Signed)
Patient came in to pick up FMLA paperwork. Stated that on line 7 it needs to say that she works from 6:45-3:15 please. Last DOS was 10/26/22. Placed in [employer].

## 2022-11-02 NOTE — Telephone Encounter (Signed)
Reviewed form and placing in PCP's box to make necessary changes (line 7 needs to read 6: 45- 3:15) per request.  .Glennie Hawk, CMA

## 2022-11-03 LAB — BASIC METABOLIC PANEL
BUN/Creatinine Ratio: 13 (ref 9–23)
BUN: 9 mg/dL (ref 6–20)
CO2: 25 mmol/L (ref 20–29)
Calcium: 9.9 mg/dL (ref 8.7–10.2)
Chloride: 98 mmol/L (ref 96–106)
Creatinine, Ser: 0.72 mg/dL (ref 0.57–1.00)
Glucose: 84 mg/dL (ref 70–99)
Potassium: 4.3 mmol/L (ref 3.5–5.2)
Sodium: 140 mmol/L (ref 134–144)
eGFR: 109 mL/min/{1.73_m2} (ref >59)

## 2022-11-03 NOTE — Telephone Encounter (Signed)
Copy made and placed in batch scanning.   Veronda Prude, RN

## 2022-11-06 ENCOUNTER — Telehealth: Payer: Self-pay | Admitting: *Deleted

## 2022-11-06 NOTE — Telephone Encounter (Signed)
Spoke with [name] in regards to last phone message about her wanting to cancel her surgery and continue with the Zoladex injections at this time. Dr. Alvester Morin advised patient that this decision was fine for her to do. Relayed this message to patient. Pt advised to call the office with any concerns or questions.

## 2022-11-07 ENCOUNTER — Encounter (HOSPITAL_COMMUNITY): Admission: RE | Payer: Self-pay | Source: Ambulatory Visit

## 2022-11-07 ENCOUNTER — Ambulatory Visit (HOSPITAL_COMMUNITY)
Admission: RE | Admit: 2022-11-07 | Payer: Federal, State, Local not specified - PPO | Source: Ambulatory Visit | Admitting: Psychiatry

## 2022-11-07 DIAGNOSIS — C50911 Malignant neoplasm of unspecified site of right female breast: Secondary | ICD-10-CM

## 2022-11-07 DIAGNOSIS — Z170 Estrogen receptor positive status [ER+]: Secondary | ICD-10-CM

## 2022-11-07 SURGERY — SALPINGO-OOPHORECTOMY, ROBOT-ASSISTED
Anesthesia: General | Laterality: Left

## 2022-11-10 ENCOUNTER — Encounter: Payer: Self-pay | Admitting: Adult Health

## 2022-11-10 ENCOUNTER — Telehealth: Payer: Self-pay | Admitting: Student

## 2022-11-10 NOTE — Telephone Encounter (Signed)
Patient dropped off form at front desk for [employer].  Verified that patient section of form has been completed.  Last DOS/WCC with PCP was 10/26/22.  Placed form in green team folder to be completed by clinical staff.  [name]

## 2022-11-13 NOTE — Telephone Encounter (Unsigned)
Reviewed form and placed in PCP's box for completion.  .Ursula Dermody R Isa Hitz, CMA  

## 2022-11-16 ENCOUNTER — Encounter: Payer: Self-pay | Admitting: Adult Health

## 2022-11-16 ENCOUNTER — Other Ambulatory Visit: Payer: Self-pay

## 2022-11-16 ENCOUNTER — Inpatient Hospital Stay: Payer: Federal, State, Local not specified - PPO

## 2022-11-16 ENCOUNTER — Inpatient Hospital Stay: Payer: Federal, State, Local not specified - PPO | Attending: Hematology and Oncology | Admitting: Adult Health

## 2022-11-16 VITALS — BP 127/90 | HR 91 | Temp 97.7°F | Resp 16 | Wt 180.8 lb

## 2022-11-16 DIAGNOSIS — Z8249 Family history of ischemic heart disease and other diseases of the circulatory system: Secondary | ICD-10-CM | POA: Diagnosis not present

## 2022-11-16 DIAGNOSIS — Z5111 Encounter for antineoplastic chemotherapy: Secondary | ICD-10-CM | POA: Insufficient documentation

## 2022-11-16 DIAGNOSIS — Z833 Family history of diabetes mellitus: Secondary | ICD-10-CM | POA: Diagnosis not present

## 2022-11-16 DIAGNOSIS — F419 Anxiety disorder, unspecified: Secondary | ICD-10-CM | POA: Diagnosis not present

## 2022-11-16 DIAGNOSIS — Z801 Family history of malignant neoplasm of trachea, bronchus and lung: Secondary | ICD-10-CM | POA: Diagnosis not present

## 2022-11-16 DIAGNOSIS — Z90721 Acquired absence of ovaries, unilateral: Secondary | ICD-10-CM | POA: Diagnosis not present

## 2022-11-16 DIAGNOSIS — Z800 Family history of malignant neoplasm of digestive organs: Secondary | ICD-10-CM | POA: Insufficient documentation

## 2022-11-16 DIAGNOSIS — E785 Hyperlipidemia, unspecified: Secondary | ICD-10-CM | POA: Diagnosis not present

## 2022-11-16 DIAGNOSIS — F32A Depression, unspecified: Secondary | ICD-10-CM | POA: Insufficient documentation

## 2022-11-16 DIAGNOSIS — Z808 Family history of malignant neoplasm of other organs or systems: Secondary | ICD-10-CM | POA: Diagnosis not present

## 2022-11-16 DIAGNOSIS — Z79899 Other long term (current) drug therapy: Secondary | ICD-10-CM | POA: Insufficient documentation

## 2022-11-16 DIAGNOSIS — Z8042 Family history of malignant neoplasm of prostate: Secondary | ICD-10-CM | POA: Diagnosis not present

## 2022-11-16 DIAGNOSIS — C50811 Malignant neoplasm of overlapping sites of right female breast: Secondary | ICD-10-CM | POA: Diagnosis not present

## 2022-11-16 DIAGNOSIS — Z170 Estrogen receptor positive status [ER+]: Secondary | ICD-10-CM

## 2022-11-16 DIAGNOSIS — R7303 Prediabetes: Secondary | ICD-10-CM | POA: Insufficient documentation

## 2022-11-16 DIAGNOSIS — Z9049 Acquired absence of other specified parts of digestive tract: Secondary | ICD-10-CM | POA: Diagnosis not present

## 2022-11-16 MED ORDER — GOSERELIN ACETATE 3.6 MG ~~LOC~~ IMPL
3.6000 mg | DRUG_IMPLANT | Freq: Once | SUBCUTANEOUS | Status: AC
  Administered 2022-11-16: 3.6 mg via SUBCUTANEOUS
  Filled 2022-11-16: qty 3.6

## 2022-11-16 NOTE — Telephone Encounter (Signed)
Called patient. She did not answer, LVM advising that paperwork had been completed.   Copy made and placed in batch scanning.   Veronda Prude, RN

## 2022-11-16 NOTE — Assessment & Plan Note (Signed)
Brooke Marks is a 42 year old woman who is here for stage Ib ER/PR positive breast cancer status post right lumpectomy, adjuvant radiation, and antiestrogen therapy with Zoladex and anastrozole.  Stage Ib right breast ER/PR positive invasive ductal carcinoma: She continues on Zoladex and anastrozole with good tolerance.  She will continue this. Anxiety and depression: I recommended that she continue to follow-up with PCP for her Wellbutrin management and also with her therapist.  She knows to call for any questions or concerns between now and her next appointment with [place].  Brooke Marks will return every 4 weeks for an injection and in 12 weeks for f/u with Dr. Pamelia Hoit.

## 2022-11-16 NOTE — Telephone Encounter (Signed)
[facility] calls nurse line in regards to [facility] paperwork.  She reports going forward they will handle all of her FMLA needs.

## 2022-11-16 NOTE — Progress Notes (Signed)
[facility] Cancer Follow up:    Brooke Mattocks, DO [address] [place] [address]   DIAGNOSIS:  Cancer Staging  Malignant neoplasm of overlapping 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, estrogen receptor positive (HCC) Staging form: Breast, AJCC 8th Edition - Clinical stage from 03/17/2022: Stage IB (cT1b, cN1, cM0, G2, ER+, PR+, HER2-) - Signed by Serena Croissant, MD on 03/17/2022 Stage prefix: Initial diagnosis Histologic grading system: 3 grade system - Pathologic: Stage IB (pT3, pN1, cM0, G2, ER+, PR+, HER2-) - Signed by Serena Croissant, MD on 05/24/2022 Histologic grading system: 3 grade system   SUMMARY OF ONCOLOGIC HISTORY: Oncology History  Malignant neoplasm of overlapping 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, estrogen receptor positive (HCC)  03/08/2022 Initial Diagnosis   Palpable lump in the right breast, mammogram and ultrasound revealed 0.8 cm and 0.8 cm and 0.5 cm masses, 10 cm suspicious calcifications, single enlarged right axillary lymph node 11:00: Grade 2 IDC with IG DCIS ER 70%, PR 90%, Ki-67 20%, HER2 1+ negative Lymph node: Positive UIQ and UOQ right breast: Grade 2 IDC with IG DCIS   03/17/2022 Cancer Staging   Staging form: Breast, AJCC 8th Edition - Clinical stage from 03/17/2022: Stage IB (cT1b, cN1, cM0, G2, ER+, PR+, HER2-) - Signed by Serena Croissant, MD on 03/17/2022 Stage prefix: Initial diagnosis Histologic grading system: 3 grade system   03/30/2022 Genetic Testing   Negative [facility] CustomNext-Cancer +RNAinsight Panel.  Report date is 03/30/2022.   The CustomNext-Cancer+RNAinsight panel offered by [employer] includes sequencing and rearrangement analysis for the following 37 genes:  APC, ATM, BARD1, BMPR1A, BRCA1, BRCA2, BRIP1, CDH1, CDK4, CDKN2A, CHEK2, DICER1, MLH1, MSH2, MSH6, MUTYH, NBN, NF1, NTHL1, PALB2, PMS2, PTEN, RAD51C, RAD51D, RECQL, SMAD4, SMARCA4, STK11 and TP53 (sequencing and deletion/duplication); AXIN2, CTNNA1, HOXB13, MSH3,  POLD1 and POLE (sequencing only); EPCAM and GREM1 (deletion/duplication only). RNA data is routinely analyzed for use in variant interpretation for all genes   05/08/2022 Surgery   Right lumpectomy: Grade 2 IDC 8 cm with intermediate grade DCIS, margins negative, ER 70%, PR 90%, HER2 negative 1+, 1/4 lymph nodes positive with focal extracapsular extension   05/24/2022 Cancer Staging   Staging form: Breast, AJCC 8th Edition - Pathologic: Stage IB (pT3, pN1, cM0, G2, ER+, PR+, HER2-) - Signed by Serena Croissant, MD on 05/24/2022 Histologic grading system: 3 grade system   07/13/2022 - 08/23/2022 Radiation Therapy   Plan Name: CW_R_BO Site: Chest Wall, Right Technique: 3D Mode: Photon Dose Per Fraction: 2 Gy Prescribed Dose (Delivered / Prescribed): 26 Gy / 26 Gy Prescribed Fxs (Delivered / Prescribed): 13 / 13   Plan Name: CW_R Site: Chest Wall, Right Technique: 3D Mode: Photon Dose Per Fraction: 2 Gy Prescribed Dose (Delivered / Prescribed): 24 Gy / 24 Gy Prescribed Fxs (Delivered / Prescribed): 12 / 12   Plan Name: CW_R_Scv Site: Chest Wall, Right Technique: 3D Mode: Photon Dose Per Fraction: 2 Gy Prescribed Dose (Delivered / Prescribed): 50 Gy / 50 Gy Prescribed Fxs (Delivered / Prescribed): 25 / 25   Plan Name: CW_R_Bst_BO Site: Chest Wall, Right Technique: Electron Mode: Electron Dose Per Fraction: 2 Gy Prescribed Dose (Delivered / Prescribed): 10 Gy / 10 Gy Prescribed Fxs (Delivered / Prescribed): 5 / 5   08/2022 -  Anti-estrogen oral therapy   Anastrozole daily with zoladex every 4 weeks     CURRENT THERAPY: Anastrozole and Zoladex  INTERVAL HISTORY: Brooke Marks 42 y.o. female returns for follow-up prior to receiving Zoladex.  At her last appointment on May 30 she was incredibly tearful and depressed.  Since our appointment she started Wellbutrin and began going to counseling.  She has been feeling better and tells me she hasn't cried in 2 weeks.  She did not  stop Anastrozole and isn't planning to now that she is feeling better.     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 Rarely uses her cell phone for calls. Usually communicates via text. Affecting her job. Works with [employer]. 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 Concerned that her employer is planning to return her previous position. Denies agoraphobia.      PIH (pregnancy induced hypertension) 01/27/2016   PONV (postoperative nausea and vomiting)    S/P cesarean section 01/27/2016    SURGICAL HISTORY: Past Surgical History:  Procedure Laterality Date   BREAST BIOPSY  05/05/2022   [place] RT RADIOACTIVE SEED LOC 05/05/2022 [facility] MAMMOGRAPHY   CESAREAN SECTION N/A 01/27/2016   Procedure: CESAREAN SECTION;  Surgeon: Sherian Rein, MD;  Location: [facility];  Service: Obstetrics;  Laterality: N/A;  MD requests RNFA   COLON RESECTION N/A 03/30/2019   Procedure: LAPAROSCOPIC RIGHT COLECTOMY WITH RIGHT SALPINGO OOPHORECTOMY TAP BLOCK;  Surgeon: Karie Soda,  MD;  Location: [facility];  Service: General;  Laterality: N/A;   LAPAROSCOPIC LYSIS OF ADHESIONS N/A 03/30/2019   Procedure: LAPAROSCOPIC LYSIS OF ADHESIONS;  Surgeon: Karie Soda, MD;  Location: [facility];  Service: General;  Laterality: N/A;   MASTECTOMY W/ SENTINEL NODE BIOPSY Right 05/08/2022   Procedure: RIGHT MASTECTOMY WITH AXILLARY SENTINEL LYMPH NODE BIOPSY;  Surgeon: Emelia Loron, MD;  Location: [facility];  Service: General;  Laterality: Right;   RADIOACTIVE SEED GUIDED AXILLARY SENTINEL LYMPH NODE Right 05/08/2022   Procedure: RADIOACTIVE SEED GUIDED RIGHT AXILLARY SENTINEL LYMPH NODE EXCISION;  Surgeon: Emelia Loron, MD;  Location: [facility];  Service: General;  Laterality: Right;   WISDOM TOOTH EXTRACTION      SOCIAL HISTORY: Social History   Socioeconomic History   Marital status: Married    Spouse name: Not on file   Number of children: Not on file   Years of education: Not on file   Highest education level: Not on file  Occupational History   Not on file  Tobacco Use   Smoking status: Never    Passive exposure: Never   Smokeless tobacco: Never  Vaping Use   Vaping Use: Never used  Substance and Sexual Activity   Alcohol use: No   Drug use: No   Sexual  activity: Yes    Birth control/protection: None  Other Topics Concern   Not on file  Social History Narrative   ** Merged History Encounter **       Social Determinants of Health   Financial Resource Strain: Not on file  Food Insecurity: No Food Insecurity (10/05/2022)   Hunger Vital Sign    Worried About Running Out of Food in the Last Year: Never true    Ran Out of Food in the Last Year: Never true  Transportation Needs: No Transportation Needs (10/05/2022)   PRAPARE - Administrator, Civil Service (Medical): No    Lack of Transportation (Non-Medical): No  Physical Activity: Not on file  Stress: Not on file  Social Connections: Socially Integrated  (10/05/2022)   Social Connection and Isolation Panel [NHANES]    Frequency of Communication with Friends and Family: More than three times a week    Frequency of Social Gatherings with Friends and Family: Once a week    Attends Religious Services: More than 4 times per year    Active Member of [employer] or Organizations: No    Attends Engineer, structural: More than 4 times per year    Marital Status: Married  Catering manager Violence: Not on file    FAMILY HISTORY: Family History  Problem Relation Age of Onset   Diabetes Mother    Hypertension Father    Prostate cancer Maternal Grandfather 60       d. 42; mets   Lung cancer Maternal Aunt        dx after 50   Stomach cancer Maternal Uncle        dx 22s-80s   Cancer Cousin        unknown type; dx after 16; pat female cousin   Pancreatic cancer Neg Hx    Colon cancer Neg Hx    Endometrial cancer Neg Hx    Ovarian cancer Neg Hx     Review of Systems  Constitutional:  Negative for appetite change, chills, fatigue, fever and unexpected weight change.  HENT:   Negative for hearing loss, lump/mass and trouble swallowing.   Eyes:  Negative for eye problems and icterus.  Respiratory:  Negative for chest tightness, cough and shortness of breath.   Cardiovascular:  Negative for chest pain, leg swelling and palpitations.  Gastrointestinal:  Negative for abdominal distention, abdominal pain, constipation, diarrhea, nausea and vomiting.  Endocrine: Negative for hot flashes.  Genitourinary:  Negative for difficulty urinating.   Musculoskeletal:  Negative for arthralgias.  Skin:  Negative for itching and rash.  Neurological:  Negative for dizziness, extremity weakness, headaches and numbness.  Hematological:  Negative for adenopathy. Does not bruise/bleed easily.  Psychiatric/Behavioral:  Negative for depression. The patient is not nervous/anxious.   All other systems reviewed and are negative.     PHYSICAL  EXAMINATION    Vitals:   11/16/22 1447  BP: (!) 127/90  Pulse: 91  Resp: 16  Temp: 97.7 F (36.5 C)  SpO2: 99%   Exam deferred in lieu of counseling Full PE completed 4 weeks ago    ASSESSMENT and THERAPY PLAN:   Malignant neoplasm of overlapping 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, estrogen receptor positive (HCC) Brooke Marks is a 42 year old woman who is here for stage Ib ER/PR positive breast cancer status post right lumpectomy, adjuvant radiation, and antiestrogen therapy with Zoladex and anastrozole.  Stage Ib right breast ER/PR positive invasive ductal carcinoma: She continues on Zoladex and anastrozole  with good tolerance.  She will continue this. Anxiety and depression: I recommended that she continue to follow-up with PCP for her Wellbutrin management and also with her therapist.  She knows to call for any questions or concerns between now and her next appointment with [place].  Brooke Marks will return every 4 weeks for an injection and in 12 weeks for f/u with Dr. Pamelia Hoit.      All questions were answered. The patient knows to call the clinic with any problems, questions or concerns. We can certainly see the patient much sooner if necessary.  Total encounter time:15 minutes*in face-to-face visit time, chart review, lab review, care coordination, order entry, and documentation of the encounter time.    Lillard Anes, NP 11/16/22 3:25 PM Medical Oncology and Hematology [facility] [address] [place], [place] [address] Tel. [phone]    Fax. [phone]  *Total Encounter Time as defined by the Centers for Medicare and Medicaid Services includes, in addition to the face-to-face time of a patient visit (documented in the note above) non-face-to-face time: obtaining and reviewing outside history, ordering and reviewing medications, tests or procedures, care coordination (communications with other health care professionals or caregivers) and documentation in the  medical record.

## 2022-11-17 ENCOUNTER — Other Ambulatory Visit: Payer: Self-pay | Admitting: Family Medicine

## 2022-11-17 ENCOUNTER — Telehealth: Payer: Self-pay | Admitting: *Deleted

## 2022-11-17 DIAGNOSIS — I10 Essential (primary) hypertension: Secondary | ICD-10-CM

## 2022-11-17 NOTE — Telephone Encounter (Signed)
Connected with Francys Mila Palmer, [phone] (home) to advise this FMLA nurse calling regarding the (blank) form delivered to office received by form staff will not be completed.  It is a duplicate of what was completed by PCP on 10/30/2022 noted in media notes and correspondence tab.   (A handwritten post-it note on [facility] Disability/FMLA Cover sheet reads she needs a "Reasonable accommodation for my depression and brain fog from medication needing less workload and to keep my current schedule 7:am - 3 pm.")       11/16/2022 [facility] visit advised to continue with PCP and therapist for Wellbutrin management, anxiety and depression.  Medical Oncologist aware of 10/30/2022 submission, PCP is doing everything to help this request for Reasonable Accommodation FMLA.   Nurse was to ask the person who does FMLA.  "I need 2-months.  PCP only listed 44-months".       Advised time frames are determined and approved by employer standards despite supportive medical information provided for needed leave or accommodations.  Six months is more likely to be approved than 5-months.         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  I will pick up the form."        Collaborative notified.  Collaborative reports she spoke with patient, displayed "form for patient pick up this coming Monday.  Copy of form from PCP was brought in for [facility] to review the 47-month FMLA to assist for 59-months". No further activities performed or required by this nurse.

## 2022-11-22 ENCOUNTER — Encounter: Payer: Self-pay | Admitting: Nurse Practitioner

## 2022-11-22 ENCOUNTER — Telehealth: Payer: Federal, State, Local not specified - PPO | Admitting: Nurse Practitioner

## 2022-11-22 DIAGNOSIS — R300 Dysuria: Secondary | ICD-10-CM | POA: Diagnosis not present

## 2022-11-22 MED ORDER — PHENAZOPYRIDINE HCL 100 MG PO TABS
100.0000 mg | ORAL_TABLET | Freq: Three times a day (TID) | ORAL | 0 refills | Status: AC | PRN
Start: 2022-11-22 — End: 2022-11-24

## 2022-11-22 MED ORDER — SULFAMETHOXAZOLE-TRIMETHOPRIM 800-160 MG PO TABS
1.0000 | ORAL_TABLET | Freq: Two times a day (BID) | ORAL | 0 refills | Status: AC
Start: 2022-11-22 — End: 2022-11-27

## 2022-11-22 NOTE — Progress Notes (Signed)
Virtual Visit Consent   Brooke Marks, you are scheduled for a virtual visit with a [facility] provider today. Just as with appointments in the office, your consent must be obtained to participate. Your consent will be active for this visit and any virtual visit you may have with one of our providers in the next 365 days. If you have a MyChart account, a copy of this consent can be sent to you electronically.  As this is a virtual visit, video technology does not allow for your provider to perform a traditional examination. This may limit your provider's ability to fully assess your condition. If your provider identifies any concerns that need to be evaluated in person or the need to arrange testing (such as labs, EKG, etc.), we will make arrangements to do so. Although advances in technology are sophisticated, we cannot ensure that it will always work on either your end or our end. If the connection with a video visit is poor, the visit may have to be switched to a telephone visit. With either a video or telephone visit, we are not always able to ensure that we have a secure connection.  By engaging in this virtual visit, you consent to the provision of healthcare and authorize for your insurance to be billed (if applicable) for the services provided during this visit. Depending on your insurance coverage, you may receive a charge related to this service.  I need to obtain your verbal consent now. Are you willing to proceed with your visit today? Brooke Marks has provided verbal consent on 11/22/2022 for a virtual visit (video or telephone). Viviano Simas, FNP  Date: 11/22/2022 4:27 PM  Virtual Visit via Video Note   I, Viviano Simas, connected with  Brooke Marks  ([id number], August 15, 1980) on 11/22/22 at  4:30 PM EDT by a video-enabled telemedicine application and verified that I am speaking with the correct person using two identifiers.  Location: Patient: Virtual Visit Location  Patient: Home Provider: Virtual Visit Location Provider: Home Office   I discussed the limitations of evaluation and management by telemedicine and the availability of in person appointments. The patient expressed understanding and agreed to proceed.    History of Present Illness: Brooke Marks is a 42 y.o. who identifies as a female who was assigned female at birth, and is being seen today for symptoms she believes to be a UTI Started having symptoms earlier today, increased urinary frequency, urgency and discomfort   Most recently had one in April and had a culture at that time that had multiple species present. She was treated with Bactrim and had good outcome at that time  Denies a history of UTI prior to CA diagnosis and treatment   Plans to discuss recurrent symptoms with oncology at next appointment  Unable to follow up over the weekend due to [facility]    She is currently a breast CA patient    Problems:  Patient Active Problem List   Diagnosis Date Noted   S/P mastectomy, right 05/08/2022   Hyperlipidemia 04/11/2022   Genetic testing 03/28/2022   Malignant neoplasm of overlapping 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, estrogen receptor positive (HCC) 03/14/2022   Prediabetes 03/14/2022   Invasive ductal carcinoma of breast, stage 2, right (HCC) 02/14/2022   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/30/2019 03/30/2019   Hypertension 01/27/2016   S/P cesarean section 01/27/2016   Anxiety state 04/13/2015   CARPAL TUNNEL SYNDROME, LEFT 04/11/2010   HERPES SIMPLEX  INFECTION, RECURRENT 02/26/2009   Obesity (BMI 30.0-34.9) 07/19/2006    Allergies: No Known Allergies Medications:  Current Outpatient Medications:    amLODipine-valsartan (EXFORGE) 5-160 MG tablet, TAKE 1 TABLET BY MOUTH EVERY DAY, Disp: 90 tablet, Rfl: 1   anastrozole (ARIMIDEX) 1 MG tablet, Take 1 tablet (1 mg total) by mouth daily., Disp: 90 tablet, Rfl: 3   buPROPion (WELLBUTRIN XL) 150 MG 24 hr  tablet, Take 1 tablet (150 mg total) by mouth daily for 14 days, THEN 2 tablets (300 mg total) daily for 14 days., Disp: 60 tablet, Rfl: 0   goserelin (ZOLADEX) 3.6 MG injection, Inject 3.6 mg into the skin every 28 (twenty-eight) days., Disp: , Rfl:    Multiple Vitamin (MULTIVITAMIN) tablet, Take 1 tablet by mouth daily., Disp: , Rfl:   Observations/Objective: Patient is well-developed, well-nourished in no acute distress.  Resting comfortably  at home.  Head is normocephalic, atraumatic.  No labored breathing.  Speech is clear and coherent with logical content.  Patient is alert and oriented at baseline.    Assessment and Plan: 1. Dysuria  - sulfamethoxazole-trimethoprim (BACTRIM DS) 800-160 MG tablet; Take 1 tablet by mouth 2 (two) times daily for 5 days.  Dispense: 10 tablet; Refill: 0    Follow up with oncology to discuss symptoms as possible SE of treatment  Also for follow up UA/Urine culture post treatment   If no improvement of with worsening symptoms over the weekend please seek follow up at Urgent Care   Follow Up Instructions: I discussed the assessment and treatment plan with the patient. The patient was provided an opportunity to ask questions and all were answered. The patient agreed with the plan and demonstrated an understanding of the instructions.  A copy of instructions were sent to the patient via MyChart unless otherwise noted below.    The patient was advised to call back or seek an in-person evaluation if the symptoms worsen or if the condition fails to improve as anticipated.  Time:  I spent 15 minutes with the patient via telehealth technology discussing the above problems/concerns.    Viviano Simas, FNP

## 2022-11-24 ENCOUNTER — Telehealth: Payer: Self-pay

## 2022-11-24 NOTE — Telephone Encounter (Signed)
11/17/2022  RE: FMLA and reasonable accomodations Received: 1 week ago Augusto Garbe, RN sent to Fayette Pho, MD; De Blanch, LPN Cc: Shelby Mattocks, DO; Fleeger, Princella Pellegrini, CMA Hello,       [facility] provider aware of this message.  Primary care provider is doing everything to help her.  Our office does not need to do anything further from oncology perspective.  Patient was seen by [facility] provider Thursday, 11/16/2026.  Advised to continue follow up with PCP and therapist for anxiety and depression and management.  Will notify patient our office will not resubmit accommodation request.  Primary Care office best practice to manage and complete FMLA and accommodation requests. We will not be completing the form brought to our office.  Augusto Garbe, RN, BSN        FW: FMLA and reasonable accomodations Received: 1 week ago Augusto Garbe, RN sent to [employer]; Serena Croissant, MD; Murriel Hopper, RN Dr. Pamelia Hoit,       See message received from PCP.  Who should manage FMLA work accommodation.. Please advise.  Noted Survivorship surveillance,        PCP has returned accommodation that we have not yet started.  Shilynn Mila Palmer attached a note to form reads "to keep current schedule of eight hour shifts (7:00 am to 3:00 pm for anxiety, depression and brain fog from medication needing less work load."  [employer], [phone]         Previous Messages    ----- Message ----- From: Fayette Pho, MD Sent: 11/15/2022   2:10 PM EDT To: Princella Pellegrini Fleeger, CMA; * Subject: FMLA and reasonable accomodations              Hello,  My name is Dr. Larita Fife and I'm a physician at the [facility] family medicine center. I evaluated Ms. Bess on 6/06 for FMLA completion. I have cc'd her assigned PCP to keep everyone in the loop. I understand she has requested FMLA and reasonable accommodations paperwork through your clinic too via MyChart message  6/21, stating "my PCP is not being very helpful with my anxiety and depression and filling out my fmla paperwork". I want to ensure we do not duplicate forms for the same condition.  My timeline of interactions with her: 6/06: Appointment where we discussed FMLA for anxiety and came up with a treatment plan that allowed for initiation of medication, therapy, and routine follow up with PCP. Please see that clinic note for more.  6/10: First scan on this day under media tab is original FMLA forms signed by myself. Patient voiced concern that I did not specify in the FMLA the shift she wanted to keep, [id number]. I addended the paperwork to reflect her request. This FMLA form is found under the media tab on 6/10 and is the second document.  She later reported her HR would not accept my addendum stating she requested a certain shift. I let her know that I cannot request she keep a certain shift time as this is not medically necessary.  6/21: She dropped off reasonable accommodations forms to be filled out.  6/26: I have completed her reasonable accommodations forms and noted her request to you for duplicate forms.    Now, I have no preference if your clinic or our clinic manages her FMLA and reasonable accommodations forms. I simply wanted to make you aware of what we have already done thus far. Please let us know if you will be  the primary providers completing these forms.  Fayette Pho, MD

## 2022-11-24 NOTE — Telephone Encounter (Signed)
S/w pt this morning. She is coming to pick up her paperwork today.

## 2022-12-04 ENCOUNTER — Encounter: Payer: Self-pay | Admitting: Hematology and Oncology

## 2022-12-08 ENCOUNTER — Telehealth: Payer: Self-pay | Admitting: Hematology and Oncology

## 2022-12-14 ENCOUNTER — Other Ambulatory Visit: Payer: Self-pay

## 2022-12-14 ENCOUNTER — Inpatient Hospital Stay: Payer: Federal, State, Local not specified - PPO

## 2022-12-14 ENCOUNTER — Inpatient Hospital Stay: Payer: Federal, State, Local not specified - PPO | Attending: Hematology and Oncology

## 2022-12-14 VITALS — BP 136/92 | HR 86 | Temp 98.6°F | Resp 16

## 2022-12-14 DIAGNOSIS — E785 Hyperlipidemia, unspecified: Secondary | ICD-10-CM | POA: Insufficient documentation

## 2022-12-14 DIAGNOSIS — I10 Essential (primary) hypertension: Secondary | ICD-10-CM | POA: Insufficient documentation

## 2022-12-14 DIAGNOSIS — Z8249 Family history of ischemic heart disease and other diseases of the circulatory system: Secondary | ICD-10-CM | POA: Diagnosis not present

## 2022-12-14 DIAGNOSIS — Z809 Family history of malignant neoplasm, unspecified: Secondary | ICD-10-CM | POA: Diagnosis not present

## 2022-12-14 DIAGNOSIS — C50811 Malignant neoplasm of overlapping sites of right female breast: Secondary | ICD-10-CM | POA: Insufficient documentation

## 2022-12-14 DIAGNOSIS — Z170 Estrogen receptor positive status [ER+]: Secondary | ICD-10-CM | POA: Insufficient documentation

## 2022-12-14 DIAGNOSIS — Z8042 Family history of malignant neoplasm of prostate: Secondary | ICD-10-CM | POA: Diagnosis not present

## 2022-12-14 DIAGNOSIS — Z801 Family history of malignant neoplasm of trachea, bronchus and lung: Secondary | ICD-10-CM | POA: Diagnosis not present

## 2022-12-14 DIAGNOSIS — Z79899 Other long term (current) drug therapy: Secondary | ICD-10-CM | POA: Insufficient documentation

## 2022-12-14 DIAGNOSIS — Z5111 Encounter for antineoplastic chemotherapy: Secondary | ICD-10-CM | POA: Diagnosis not present

## 2022-12-14 DIAGNOSIS — Z833 Family history of diabetes mellitus: Secondary | ICD-10-CM | POA: Diagnosis not present

## 2022-12-14 DIAGNOSIS — Z800 Family history of malignant neoplasm of digestive organs: Secondary | ICD-10-CM | POA: Insufficient documentation

## 2022-12-14 MED ORDER — GOSERELIN ACETATE 3.6 MG ~~LOC~~ IMPL
3.6000 mg | DRUG_IMPLANT | Freq: Once | SUBCUTANEOUS | Status: AC
  Administered 2022-12-14: 3.6 mg via SUBCUTANEOUS
  Filled 2022-12-14: qty 3.6

## 2022-12-19 ENCOUNTER — Telehealth: Payer: Self-pay | Admitting: Hematology and Oncology

## 2022-12-19 NOTE — Telephone Encounter (Signed)
Patient has requested to move all appointments to morning; patient is aware of rescheduled appointment times/dates

## 2023-01-05 ENCOUNTER — Encounter: Payer: Self-pay | Admitting: Hematology and Oncology

## 2023-01-11 ENCOUNTER — Inpatient Hospital Stay: Payer: Federal, State, Local not specified - PPO | Attending: Hematology and Oncology

## 2023-01-11 ENCOUNTER — Inpatient Hospital Stay: Payer: Federal, State, Local not specified - PPO

## 2023-01-11 VITALS — BP 133/86 | HR 104 | Temp 98.4°F | Resp 16

## 2023-01-11 DIAGNOSIS — Z170 Estrogen receptor positive status [ER+]: Secondary | ICD-10-CM | POA: Diagnosis not present

## 2023-01-11 DIAGNOSIS — Z5111 Encounter for antineoplastic chemotherapy: Secondary | ICD-10-CM | POA: Insufficient documentation

## 2023-01-11 DIAGNOSIS — Z79899 Other long term (current) drug therapy: Secondary | ICD-10-CM | POA: Diagnosis not present

## 2023-01-11 DIAGNOSIS — F419 Anxiety disorder, unspecified: Secondary | ICD-10-CM | POA: Insufficient documentation

## 2023-01-11 DIAGNOSIS — F32A Depression, unspecified: Secondary | ICD-10-CM | POA: Diagnosis not present

## 2023-01-11 DIAGNOSIS — C50811 Malignant neoplasm of overlapping sites of right female breast: Secondary | ICD-10-CM | POA: Diagnosis not present

## 2023-01-11 MED ORDER — GOSERELIN ACETATE 3.6 MG ~~LOC~~ IMPL
3.6000 mg | DRUG_IMPLANT | Freq: Once | SUBCUTANEOUS | Status: AC
  Administered 2023-01-11: 3.6 mg via SUBCUTANEOUS
  Filled 2023-01-11: qty 3.6

## 2023-01-30 ENCOUNTER — Encounter: Payer: Self-pay | Admitting: Student

## 2023-02-02 ENCOUNTER — Encounter: Payer: Self-pay | Admitting: Family Medicine

## 2023-02-02 ENCOUNTER — Ambulatory Visit (INDEPENDENT_AMBULATORY_CARE_PROVIDER_SITE_OTHER): Payer: Federal, State, Local not specified - PPO | Admitting: Family Medicine

## 2023-02-02 ENCOUNTER — Other Ambulatory Visit: Payer: Self-pay

## 2023-02-02 VITALS — BP 128/92 | HR 91 | Ht 62.0 in | Wt 184.0 lb

## 2023-02-02 DIAGNOSIS — Z170 Estrogen receptor positive status [ER+]: Secondary | ICD-10-CM

## 2023-02-02 DIAGNOSIS — I10 Essential (primary) hypertension: Secondary | ICD-10-CM

## 2023-02-02 DIAGNOSIS — C50811 Malignant neoplasm of overlapping sites of right female breast: Secondary | ICD-10-CM

## 2023-02-02 DIAGNOSIS — E669 Obesity, unspecified: Secondary | ICD-10-CM

## 2023-02-02 DIAGNOSIS — R7303 Prediabetes: Secondary | ICD-10-CM

## 2023-02-02 DIAGNOSIS — K760 Fatty (change of) liver, not elsewhere classified: Secondary | ICD-10-CM | POA: Diagnosis not present

## 2023-02-02 DIAGNOSIS — F329 Major depressive disorder, single episode, unspecified: Secondary | ICD-10-CM | POA: Insufficient documentation

## 2023-02-02 LAB — POCT GLYCOSYLATED HEMOGLOBIN (HGB A1C): HbA1c, POC (controlled diabetic range): 6.2 % (ref 0.0–7.0)

## 2023-02-02 MED ORDER — TIRZEPATIDE-WEIGHT MANAGEMENT 2.5 MG/0.5ML ~~LOC~~ SOLN
2.5000 mg | SUBCUTANEOUS | 1 refills | Status: DC
Start: 2023-02-02 — End: 2023-02-07

## 2023-02-02 NOTE — Assessment & Plan Note (Addendum)
A1c 6.2, stable from testing 3 months ago.  Patient with con commitment obesity and hepatic steatosis, would benefit from GLP-1 therapy. -Zepbound 2.5 mg weekly, discussed with patient this may require additional insurance approval and she would like to proceed -Consider metformin therapy if not approved

## 2023-02-02 NOTE — Assessment & Plan Note (Signed)
PHQ: 15, denies SI but tearful throughout exam. Seeing therapist, appointment this afternoon. Compliant on Bupropion 150 mg daily feels that has helped some, states she would like to wait on further medication changes. -F/u in 1 month for mood check

## 2023-02-02 NOTE — Patient Instructions (Addendum)
It was wonderful to see you today! Thank you for choosing [facility].   Please bring ALL of your medications with you to every visit.   Today we talked about:  I will send in the injectable medication for weight loss and prediabetes. As we discussed I am not sure if insurance will cover it but we will try. Please continue to work on lifestyle changes such as healthy eating habits and exercise. Please let me know if you would like to consider increasing Wellbutrin. I am glad you are in therapy and getting support and please let us know if there is anything else we can do to support you. From what I can see your Zoladex does not provide complete birth control. I would consider using condoms anytime you have intercourse. Please discuss with your heme/unk doctor for more information. Please continue to check your blood pressure at home daily and continue to take your blood pressure medication.  Please follow up in 1 month for mood check  Call the clinic at [phone] if your symptoms worsen or you have any concerns.  Please be sure to schedule follow up at the front desk before you leave today.   Elberta Fortis, DO Family Medicine

## 2023-02-02 NOTE — Assessment & Plan Note (Signed)
128/92 on repeat. Home BPs well-controlled. Will continue on current therapy and encourage patient to continue monitoring at home.

## 2023-02-02 NOTE — Assessment & Plan Note (Signed)
Managed by oncology, currently on anastrozole and Zoladex.  Advised patient to discuss fertility on medications with oncologist at next visit. Encouraged use of condoms until that time.

## 2023-02-02 NOTE — Progress Notes (Signed)
    SUBJECTIVE:   CHIEF COMPLAINT / HPI:   Hypertension: - Medications: Amlodipine-valsartan 5-160 - Compliance: Yes - Checking BP at home: Around 125/86 - Denies any SOB, CP, vision changes, LE edema, medication SEs, or symptoms of hypotension  Weight loss, prediabetes Patient reports concern for progression to diabetes given her mother had significant diabetic burden including bilateral limb amputations.  States she has been trying to lose weight but reports she is always hungry which makes it challenging. Recent diagnosis of breast cancer is also increased her anxiety and depression symptoms. Denies thoughts of SI  PERTINENT  PMH / PSH: Prediabetes, breast cancer ER positive stage III, hypertension  OBJECTIVE:   BP (!) 128/92   Pulse 91   Ht 5\' 2"  (1.575 m)   Wt 184 lb (83.5 kg)   SpO2 100%   BMI 33.65 kg/m    General: NAD, able to participate in exam Cardiac: RRR, no murmurs. Respiratory: CTAB, normal effort, No wheezes, rales or rhonchi Extremities: no edema or cyanosis. Skin: warm and dry, no rashes noted Neuro: alert, no obvious focal deficits Psych: Tearful throughout exam.  States "today is a hard day".  ASSESSMENT/PLAN:   Hypertension 128/92 on repeat. Home BPs well-controlled. Will continue on current therapy and encourage patient to continue monitoring at home.  Prediabetes A1c 6.2, stable from testing 3 months ago.  Patient with con commitment obesity and hepatic steatosis, would benefit from GLP-1 therapy. -Zepbound 2.5 mg weekly, discussed with patient this may require additional insurance approval and she would like to proceed -Consider metformin therapy if not approved  Malignant neoplasm of overlapping 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, estrogen receptor positive (HCC) Managed by oncology, currently on anastrozole and Zoladex.  Advised patient to discuss fertility on medications with oncologist at next visit. Encouraged use of condoms until that  time.  MDD (major depressive disorder) PHQ: 15, denies SI but tearful throughout exam. Seeing therapist, appointment this afternoon. Compliant on Bupropion 150 mg daily feels that has helped some, states she would like to wait on further medication changes. -F/u in 1 month for mood check    Dr. Elberta Fortis, DO [facility]

## 2023-02-05 ENCOUNTER — Telehealth: Payer: Self-pay

## 2023-02-05 ENCOUNTER — Encounter: Payer: Self-pay | Admitting: Hematology and Oncology

## 2023-02-05 ENCOUNTER — Other Ambulatory Visit (HOSPITAL_COMMUNITY): Payer: Self-pay

## 2023-02-05 DIAGNOSIS — E669 Obesity, unspecified: Secondary | ICD-10-CM

## 2023-02-05 NOTE — Telephone Encounter (Signed)
Insurance prefers [place] for lower cost/tier for patient.  Pharmacy Patient Advocate Encounter   Received notification from CoverMyMeds that prior authorization for [facility] is required/requested.   Insurance verification completed.   The patient is insured through [facility] .   Per test claim: PA required; PA submitted to [facility] via CoverMyMeds Key/confirmation #/EOC [id number]. Status is pending

## 2023-02-06 ENCOUNTER — Inpatient Hospital Stay: Payer: Federal, State, Local not specified - PPO

## 2023-02-06 ENCOUNTER — Other Ambulatory Visit: Payer: Self-pay

## 2023-02-06 ENCOUNTER — Inpatient Hospital Stay: Payer: Federal, State, Local not specified - PPO | Admitting: Hematology and Oncology

## 2023-02-06 ENCOUNTER — Encounter: Payer: Self-pay | Admitting: Hematology and Oncology

## 2023-02-06 ENCOUNTER — Inpatient Hospital Stay
Payer: Federal, State, Local not specified - PPO | Attending: Hematology and Oncology | Admitting: Hematology and Oncology

## 2023-02-06 VITALS — BP 146/89 | HR 114 | Temp 97.8°F | Resp 18 | Ht 62.0 in | Wt 182.3 lb

## 2023-02-06 DIAGNOSIS — Z170 Estrogen receptor positive status [ER+]: Secondary | ICD-10-CM | POA: Insufficient documentation

## 2023-02-06 DIAGNOSIS — K760 Fatty (change of) liver, not elsewhere classified: Secondary | ICD-10-CM | POA: Insufficient documentation

## 2023-02-06 DIAGNOSIS — R7303 Prediabetes: Secondary | ICD-10-CM | POA: Diagnosis not present

## 2023-02-06 DIAGNOSIS — C50811 Malignant neoplasm of overlapping sites of right female breast: Secondary | ICD-10-CM | POA: Diagnosis not present

## 2023-02-06 DIAGNOSIS — Z79899 Other long term (current) drug therapy: Secondary | ICD-10-CM | POA: Diagnosis not present

## 2023-02-06 DIAGNOSIS — Z79811 Long term (current) use of aromatase inhibitors: Secondary | ICD-10-CM | POA: Insufficient documentation

## 2023-02-06 DIAGNOSIS — Z5111 Encounter for antineoplastic chemotherapy: Secondary | ICD-10-CM | POA: Diagnosis not present

## 2023-02-06 DIAGNOSIS — R232 Flushing: Secondary | ICD-10-CM | POA: Insufficient documentation

## 2023-02-06 MED ORDER — GOSERELIN ACETATE 3.6 MG ~~LOC~~ IMPL
3.6000 mg | DRUG_IMPLANT | Freq: Once | SUBCUTANEOUS | Status: AC
  Administered 2023-02-06: 3.6 mg via SUBCUTANEOUS
  Filled 2023-02-06: qty 3.6

## 2023-02-06 NOTE — Progress Notes (Signed)
Patient Care Team: Shelby Mattocks, DO as PCP - General (Family Medicine) Serena Croissant, MD as Consulting Physician (Hematology and Oncology) Emelia Loron, MD as Consulting Physician (General Surgery) Lonie Peak, MD as Attending Physician (Radiation Oncology)  DIAGNOSIS:  Encounter Diagnosis  Name Primary?   Malignant neoplasm of overlapping 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, estrogen receptor positive (HCC) Yes    SUMMARY OF ONCOLOGIC HISTORY: Oncology History  Malignant neoplasm of overlapping 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, estrogen receptor positive (HCC)  03/08/2022 Initial Diagnosis   Palpable lump in the right breast, mammogram and ultrasound revealed 0.8 cm and 0.8 cm and 0.5 cm masses, 10 cm suspicious calcifications, single enlarged right axillary lymph node 11:00: Grade 2 IDC with IG DCIS ER 70%, PR 90%, Ki-67 20%, HER2 1+ negative Lymph node: Positive UIQ and UOQ right breast: Grade 2 IDC with IG DCIS   03/17/2022 Cancer Staging   Staging form: Breast, AJCC 8th Edition - Clinical stage from 03/17/2022: Stage IB (cT1b, cN1, cM0, G2, ER+, PR+, HER2-) - Signed by Serena Croissant, MD on 03/17/2022 Stage prefix: Initial diagnosis Histologic grading system: 3 grade system   03/30/2022 Genetic Testing   Negative [facility] CustomNext-Cancer +RNAinsight Panel.  Report date is 03/30/2022.   The CustomNext-Cancer+RNAinsight panel offered by [employer] includes sequencing and rearrangement analysis for the following 37 genes:  APC, ATM, BARD1, BMPR1A, BRCA1, BRCA2, BRIP1, CDH1, CDK4, CDKN2A, CHEK2, DICER1, MLH1, MSH2, MSH6, MUTYH, NBN, NF1, NTHL1, PALB2, PMS2, PTEN, RAD51C, RAD51D, RECQL, SMAD4, SMARCA4, STK11 and TP53 (sequencing and deletion/duplication); AXIN2, CTNNA1, HOXB13, MSH3, POLD1 and POLE (sequencing only); EPCAM and GREM1 (deletion/duplication only). 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, margins negative, ER 70%, PR 90%, HER2 negative 1+, 1/4 lymph nodes positive with focal extracapsular extension   05/24/2022 Cancer Staging   Staging form: Breast, AJCC 8th Edition - Pathologic: Stage IB (pT3, pN1, cM0, G2, ER+, PR+, HER2-) - Signed by Serena Croissant, MD on 05/24/2022 Histologic grading system: 3 grade system   07/13/2022 - 08/23/2022 Radiation Therapy   Plan Name: CW_R_BO Site: Chest Wall, Right Technique: 3D Mode: Photon Dose Per Fraction: 2 Gy Prescribed Dose (Delivered / Prescribed): 26 Gy / 26 Gy Prescribed Fxs (Delivered / Prescribed): 13 / 13   Plan Name: CW_R Site: Chest Wall, Right Technique: 3D Mode: Photon Dose Per Fraction: 2 Gy Prescribed Dose (Delivered / Prescribed): 24 Gy / 24 Gy Prescribed Fxs (Delivered / Prescribed): 12 / 12   Plan Name: CW_R_Scv Site: Chest Wall, Right Technique: 3D Mode: Photon Dose Per Fraction: 2 Gy Prescribed Dose (Delivered / Prescribed): 50 Gy / 50 Gy Prescribed Fxs (Delivered / Prescribed): 25 / 25   Plan Name: CW_R_Bst_BO Site: Chest Wall, Right Technique: Electron Mode: Electron Dose Per Fraction: 2 Gy Prescribed Dose (Delivered / Prescribed): 10 Gy / 10 Gy Prescribed Fxs (Delivered / Prescribed): 5 / 5   08/2022 -  Anti-estrogen oral therapy   Anastrozole daily with zoladex every 4 weeks     CHIEF COMPLIANT:   Discussed the use of AI scribe software for clinical note transcription with the patient, who gave verbal consent to proceed.  History of Present Illness   The patient, a soon-to-be 42 year old with a history of breast cancer, presents for a routine follow-up. She reports tolerating the treatment well, but notice becoming very emotional in the week  leading up to each injection, indicating the wearing off of the medication. She also report rapid weight gain, which she have discussed with her primary care physician. She are awaiting insurance approval  for a weight loss shot, as she are close to being diagnosed as prediabetic and have nonalcoholic fatty liver disease. She are aware of the importance of maintaining a healthy weight for her overall health and are committed to doing so. She also report some vaginal dryness and pelvic discomfort, but it is manageable. She have not noticed any memory issues. She report hot flashes only when consuming soda or chocolate, which she now avoid. She do not smoke or drink alcohol.         ALLERGIES:  has No Known Allergies.  MEDICATIONS:  Current Outpatient Medications  Medication Sig Dispense Refill   amLODipine (NORVASC) 10 MG tablet Take 10 mg by mouth daily.     amLODipine-valsartan (EXFORGE) 5-160 MG tablet TAKE 1 TABLET BY MOUTH EVERY DAY 90 tablet 1   anastrozole (ARIMIDEX) 1 MG tablet Take 1 tablet (1 mg total) by mouth daily. 90 tablet 3   goserelin (ZOLADEX) 3.6 MG injection Inject 3.6 mg into the skin every 28 (twenty-eight) days.     Multiple Vitamin (MULTIVITAMIN) tablet Take 1 tablet by mouth daily.     Tirzepatide-Weight Management 2.5 MG/0.5ML SOLN Inject 2.5 mg into the skin once a week. 2 mL 1   buPROPion (WELLBUTRIN XL) 150 MG 24 hr tablet Take 1 tablet (150 mg total) by mouth daily for 14 days, THEN 2 tablets (300 mg total) daily for 14 days. 60 tablet 0   No current facility-administered medications for this visit.    PHYSICAL EXAMINATION: ECOG PERFORMANCE STATUS: 1 - Symptomatic but completely ambulatory  Vitals:   02/06/23 0900  BP: (!) 146/89  Pulse: (!) 114  Resp: 18  Temp: 97.8 F (36.6 C)  SpO2: 94%   Filed Weights   02/06/23 0900  Weight: 182 lb 4.8 oz (82.7 kg)      LABORATORY DATA:  I have reviewed the data as listed    Latest Ref Rng & Units 11/02/2022    2:58 PM 10/26/2022    1:23 PM 03/30/2022    3:05 PM  CMP  Glucose 70 - 99 mg/dL 84  94    BUN 6 - 20 mg/dL 9  8    Creatinine 8.29 - 1.00 mg/dL 5.62  1.30  8.65   Sodium 134 - 144 mmol/L 140  138     Potassium 3.5 - 5.2 mmol/L 4.3  3.6    Chloride 96 - 106 mmol/L 98  101    CO2 20 - 29 mmol/L 25  29    Calcium 8.7 - 10.2 mg/dL 9.9  9.1    Total Protein 6.5 - 8.1 g/dL  8.4    Total Bilirubin 0.3 - 1.2 mg/dL  0.6    Alkaline Phos 38 - 126 U/L  53    AST 15 - 41 U/L  19    ALT 0 - 44 U/L  20      Lab Results  Component Value Date   WBC 5.4 10/26/2022   HGB 13.1 10/26/2022   HCT 40.9 10/26/2022   MCV 80.0 10/26/2022   PLT 325 10/26/2022   NEUTROABS 4.1 01/14/2014    ASSESSMENT & PLAN:  Malignant neoplasm of overlapping 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, estrogen receptor positive (HCC) 05/08/2022:Right lumpectomy: Grade 2 IDC 8 cm with intermediate grade DCIS,  margins negative, ER 70%, PR 90%, HER2 negative 1+, 1/4 lymph nodes positive with focal extracapsular extension   Treatment plan: MammaPrint testing 05/30/2022: Ultralow risk Adjuvant radiation therapy 07/14/2022-08/24/2022 Adjuvant antiestrogen therapy: Ovarian function suppression plus AI started 08/24/2022 Genetic testing: Negative ------------------------------------------------------------------------------------------------------------------ Complete estrogen blockade toxicities:  Breast cancer surveillance: Breast exam 02/06/2023: Benign Mammogram left breast 06/14/2022: Benign, 0.4 cm sebaceous cyst inframammary fold Recommended [employer] minimal residual disease monitoring.  Return to clinic in 1 year for follow-up.  She will come monthly for Zoladex injections. ------------------------------------- Assessment and Plan    Breast Cancer Stable on anti-estrogen treatment. Emotional symptoms noted prior to injections. Mild vaginal dryness reported but manageable. No significant memory issues reported. -Continue current treatment regimen. -Consider participation in clinical trials if memory issues or significant vaginal dryness develop.  Weight Gain Rapid weight gain noted. Patient has prediabetes and nonalcoholic  fatty liver disease. Patient is considering weight loss shot (Zempate). -Support patient's decision to pursue weight loss shot.  Breast Cancer Surveillance Last mammogram in January. No new concerns. -Next mammogram due in January.  Cancer Monitoring Discussed new blood test to detect cancer DNA in bloodstream. -Initiate [employer] testing. Company will send someone to patient's home to draw labs every three months.  General Health Maintenance Patient does not smoke or drink alcohol. Noted triggers for hot flashes include soda and chocolate. -Continue current lifestyle modifications.          No orders of the defined types were placed in this encounter.  The patient has a good understanding of the overall plan. she agrees with it. she will call with any problems that may develop before the next visit here. Total time spent: 30 mins including face to face time and time spent for planning, charting and co-ordination of care   Tamsen Meek, MD 02/06/23

## 2023-02-06 NOTE — Assessment & Plan Note (Signed)
05/08/2022:Right lumpectomy: Grade 2 IDC 8 cm with intermediate grade DCIS, margins negative, ER 70%, PR 90%, HER2 negative 1+, 1/4 lymph nodes positive with focal extracapsular extension   Treatment plan: MammaPrint testing 05/30/2022: Ultralow risk Adjuvant radiation therapy 07/14/2022-08/24/2022 Adjuvant antiestrogen therapy: Ovarian function suppression plus AI started 08/24/2022 Genetic testing: Negative ------------------------------------------------------------------------------------------------------------------ Complete estrogen blockade toxicities:  Breast cancer surveillance: Breast exam 02/06/2023: Benign Mammogram left breast 06/14/2022: Benign, 0.4 cm sebaceous cyst inframammary fold Recommended [employer] minimal residual disease monitoring.  Return to clinic in 1 year for follow-up.  She will come monthly for Zoladex injections.

## 2023-02-06 NOTE — Telephone Encounter (Signed)
Per md orders entered for [employer]ater[employer]. Requisition and all supported documents faxed to [phone]. Faxed confirmation was received.  

## 2023-02-06 NOTE — Patient Instructions (Signed)
Goserelin Implant What is this medication? GOSERELIN (GOE se rel in) treats prostate cancer and breast cancer. It works by decreasing levels of the hormones testosterone and estrogen in the body. This prevents prostate and breast cancer cells from spreading or growing. It may also be used to treat endometriosis. This is a condition where the tissue that lines the uterus grows outside the uterus. It works by decreasing the amount of estrogen your body makes, which reduces heavy bleeding and pain. It can also be used to help thin the lining of the uterus before a surgery used to prevent or reduce heavy periods. This medicine may be used for other purposes; ask your health care provider or pharmacist if you have questions. COMMON BRAND NAME(S): Zoladex, Zoladex 6-Month What should I tell my care team before I take this medication? They need to know if you have any of these conditions: Bone problems Diabetes Heart disease History of irregular heartbeat or rhythm An unusual or allergic reaction to goserelin, other medications, foods, dyes, or preservatives Pregnant or trying to get pregnant Breastfeeding How should I use this medication? This medication is injected under the skin. It is given by your care team in a hospital or clinic setting. Talk to your care team about the use of this medication in children. Special care may be needed. Overdosage: If you think you have taken too much of this medicine contact a poison control center or emergency room at once. NOTE: This medicine is only for you. Do not share this medicine with others. What if I miss a dose? Keep appointments for follow-up doses. It is important not to miss your dose. Call your care team if you are unable to keep an appointment. What may interact with this medication? Do not take this medication with any of the following: Cisapride Dronedarone Pimozide Thioridazine This medication may also interact with the following: Other  medications that cause heart rhythm changes This list may not describe all possible interactions. Give your health care provider a list of all the medicines, herbs, non-prescription drugs, or dietary supplements you use. Also tell them if you smoke, drink alcohol, or use illegal drugs. Some items may interact with your medicine. What should I watch for while using this medication? Visit your care team for regular checks on your progress. Your symptoms may appear to get worse during the first weeks of this therapy. Tell your care team if your symptoms do not start to get better or if they get worse after this time. Using this medication for a long time may weaken your bones. If you smoke or frequently drink alcohol you may increase your risk of bone loss. A family history of osteoporosis, chronic use of medications for seizures (convulsions), or corticosteroids can also increase your risk of bone loss. The risk of bone fractures may be increased. Talk to your care team about your bone health. This medication may increase blood sugar. The risk may be higher in patients who already have diabetes. Ask your care team what you can do to lower your risk of diabetes while taking this medication. This medication should stop regular monthly menstruation in women. Tell your care team if you continue to menstruate. Talk to your care team if you wish to become pregnant or think you might be pregnant. This medication can cause serious birth defects if taken during pregnancy or for 12 weeks after stopping treatment. Talk to your care team about reliable forms of contraception. Do not breastfeed while taking this  medication. This medication may cause infertility. Talk to your care team if you are concerned about your fertility. What side effects may I notice from receiving this medication? Side effects that you should report to your care team as soon as possible: Allergic reactions--skin rash, itching, hives, swelling  of the face, lips, tongue, or throat Change in the amount of urine Heart attack--pain or tightness in the chest, shoulders, arms, or jaw, nausea, shortness of breath, cold or clammy skin, feeling faint or lightheaded Heart rhythm changes--fast or irregular heartbeat, dizziness, feeling faint or lightheaded, chest pain, trouble breathing High blood sugar (hyperglycemia)--increased thirst or amount of urine, unusual weakness or fatigue, blurry vision High calcium level--increased thirst or amount of urine, nausea, vomiting, confusion, unusual weakness or fatigue, bone pain Pain, redness, irritation, or bruising at the injection site Severe back pain, numbness or weakness of the hands, arms, legs, or feet, loss of coordination, loss of bowel or bladder control Stroke--sudden numbness or weakness of the face, arm, or leg, trouble speaking, confusion, trouble walking, loss of balance or coordination, dizziness, severe headache, change in vision Swelling and pain of the tumor site or lymph nodes Trouble passing urine Side effects that usually do not require medical attention (report to your care team if they continue or are bothersome): Change in sex drive or performance Headache Hot flashes Rapid or extreme change in emotion or mood Sweating Swelling of the ankles, hands, or feet Unusual vaginal discharge, itching, or odor This list may not describe all possible side effects. Call your doctor for medical advice about side effects. You may report side effects to FDA at 1-800-FDA-1088. Where should I keep my medication? This medication is given in a hospital or clinic. It will not be stored at home. NOTE: This sheet is a summary. It may not cover all possible information. If you have questions about this medicine, talk to your doctor, pharmacist, or health care provider.  2024 Elsevier/Gold Standard (2021-09-29 00:00:00)

## 2023-02-06 NOTE — Addendum Note (Signed)
Addended by: Janan Ridge L on: 02/06/2023 10:15 AM   Modules accepted: Orders

## 2023-02-06 NOTE — Telephone Encounter (Signed)
Pharmacy Patient Advocate Encounter  Received notification from [facility] FEP  that Prior Authorization for [facility] has been DENIED.  Full denial letter will be uploaded to the media tab. See denial reason below.    PA #/Case ID/Reference #: [id number]

## 2023-02-07 ENCOUNTER — Encounter: Payer: Self-pay | Admitting: Family Medicine

## 2023-02-07 MED ORDER — WEGOVY 0.25 MG/0.5ML ~~LOC~~ SOAJ
0.2500 mg | SUBCUTANEOUS | 1 refills | Status: DC
Start: 2023-02-07 — End: 2023-03-21

## 2023-02-07 NOTE — Telephone Encounter (Signed)
Patient calls nurse line in regards to [facility] denial.   She reports she spoke with her insurance and they stated the medication should be approved.   She reports "they" told her the "boxes" were not "checked" stating she is actively trying to lose weight with diet and exercise. Which is why the medication was denied.   Will forward to back to pharmacy team.

## 2023-02-07 NOTE — Telephone Encounter (Signed)
Patient insurance denied Zepbound prescription due to inadequate documentation about lifestyle interventions.   Per discussion with patient during prior office visit, she has been attempting weight loss with dieting (including calorie restriction) since 2011 per chart review. She attempted supervised weight loss in 2014 with reducing sweetened beverage intake and fried food intake and increasing exercise without success. She is prediabetes, MAFLD and HTN indicating significant comorbidities of her obesity.  Changed to [facility] as insurances preferred GLP-1 therapy and resubmit for coverage.  Elberta Fortis, DO

## 2023-02-07 NOTE — Telephone Encounter (Signed)
Correction: original denial was for Zepbound (as shown per denial statement below), not [place] (apologies!).  However will submit PA for [facility] now.  A Prior Authorization was initiated for this patients [employer] through CoverMyMeds.   Key: [id number]

## 2023-02-07 NOTE — Addendum Note (Signed)
Addended by: Elberta Fortis on: 02/07/2023 09:36 AM   Modules accepted: Orders

## 2023-02-07 NOTE — Telephone Encounter (Signed)
See other telephone note from this morning. 9/18.

## 2023-02-08 ENCOUNTER — Other Ambulatory Visit (HOSPITAL_COMMUNITY): Payer: Self-pay

## 2023-02-08 NOTE — Telephone Encounter (Signed)
Prior Auth for patients medication WEGOVY approved by [employer] FEP until 08/07/23.

## 2023-02-08 NOTE — Telephone Encounter (Signed)
PA Key [id number] denied.  Resubmitted again with updated questions after seeing patients info she sent via mychart.   New key: [id number]

## 2023-02-12 ENCOUNTER — Telehealth: Payer: Self-pay

## 2023-02-12 NOTE — Telephone Encounter (Signed)
Spoke to patient informing her that the FMLA documents for her spouse had been completed and were ready for pick up as requested.Documents placed up front for pick up on Friday. Patient had no further questions or concerns at this time

## 2023-02-23 LAB — SIGNATERA ONLY (NATERA MANAGED)
SIGNATERA MTM READOUT: 0 MTM/ml
SIGNATERA TEST RESULT: NEGATIVE

## 2023-02-26 ENCOUNTER — Encounter (HOSPITAL_COMMUNITY): Payer: Self-pay

## 2023-03-06 ENCOUNTER — Inpatient Hospital Stay: Payer: Federal, State, Local not specified - PPO | Attending: Hematology and Oncology

## 2023-03-06 ENCOUNTER — Inpatient Hospital Stay: Payer: Federal, State, Local not specified - PPO

## 2023-03-06 DIAGNOSIS — Z5111 Encounter for antineoplastic chemotherapy: Secondary | ICD-10-CM | POA: Diagnosis not present

## 2023-03-06 DIAGNOSIS — Z79899 Other long term (current) drug therapy: Secondary | ICD-10-CM | POA: Insufficient documentation

## 2023-03-06 DIAGNOSIS — C50811 Malignant neoplasm of overlapping sites of right female breast: Secondary | ICD-10-CM | POA: Diagnosis not present

## 2023-03-06 DIAGNOSIS — Z170 Estrogen receptor positive status [ER+]: Secondary | ICD-10-CM | POA: Diagnosis not present

## 2023-03-06 MED ORDER — GOSERELIN ACETATE 3.6 MG ~~LOC~~ IMPL
3.6000 mg | DRUG_IMPLANT | Freq: Once | SUBCUTANEOUS | Status: AC
  Administered 2023-03-06: 3.6 mg via SUBCUTANEOUS
  Filled 2023-03-06: qty 3.6

## 2023-03-07 ENCOUNTER — Telehealth: Payer: Self-pay

## 2023-03-07 NOTE — Telephone Encounter (Signed)
Called pt per MD to advise [employer] testing was negative/not detected. Pt verbalized understanding of results and knows [employer] will be in touch to schedule 3 mo repeat lab.   

## 2023-03-10 ENCOUNTER — Encounter: Payer: Self-pay | Admitting: Family Medicine

## 2023-03-12 ENCOUNTER — Other Ambulatory Visit: Payer: Self-pay | Admitting: Student

## 2023-03-12 DIAGNOSIS — E66811 Obesity, class 1: Secondary | ICD-10-CM

## 2023-03-12 MED ORDER — TIRZEPATIDE 2.5 MG/0.5ML ~~LOC~~ SOAJ
2.5000 mg | SUBCUTANEOUS | 0 refills | Status: DC
Start: 2023-03-12 — End: 2023-03-21

## 2023-03-15 ENCOUNTER — Telehealth: Payer: Self-pay

## 2023-03-15 ENCOUNTER — Other Ambulatory Visit (HOSPITAL_COMMUNITY): Payer: Self-pay

## 2023-03-15 NOTE — Telephone Encounter (Signed)
Pharmacy Patient Advocate Encounter   Received notification from Patient Advice Request messages that prior authorization for [facility] is required/requested.   PA required; PA submitted to [facility] via CoverMyMeds Key/confirmation #/EOC [id number]. Status is pending

## 2023-03-19 NOTE — Telephone Encounter (Signed)
Pharmacy Patient Advocate Encounter  Received notification from [facility] that Prior Authorization for [facility] has been DENIED.  Full denial letter will be uploaded to the media tab. See denial reason below.    PA #/Case ID/Reference #:  [id number]

## 2023-03-20 ENCOUNTER — Other Ambulatory Visit: Payer: Self-pay | Admitting: Student

## 2023-03-20 DIAGNOSIS — E66811 Obesity, class 1: Secondary | ICD-10-CM

## 2023-03-20 MED ORDER — TIRZEPATIDE-WEIGHT MANAGEMENT 2.5 MG/0.5ML ~~LOC~~ SOLN
2.5000 mg | SUBCUTANEOUS | 0 refills | Status: DC
Start: 2023-03-20 — End: 2023-05-08

## 2023-03-20 NOTE — Progress Notes (Unsigned)
  SUBJECTIVE:   CHIEF COMPLAINT / HPI:   Presents today to discuss GLP-1 use in the setting of obesity.  She has had considerable difficulty with starting this medication.  Reginal Lutes was approved however she was unable to fill the prescription due to medication shortages.  She was recently denied for [facility].  PERTINENT  PMH / PSH: HTN, prediabetes, HLD, breast cancer, MDD  OBJECTIVE:  There were no vitals taken for this visit. Physical Exam   ASSESSMENT/PLAN:   Assessment & Plan  No follow-ups on file. Shelby Mattocks, DO 03/20/2023, 4:18 PM PGY-3, [facility] {    This will disappear when note is signed, click to select method of visit    :1}

## 2023-03-21 ENCOUNTER — Encounter: Payer: Self-pay | Admitting: Student

## 2023-03-21 ENCOUNTER — Other Ambulatory Visit: Payer: Self-pay | Admitting: Student

## 2023-03-21 ENCOUNTER — Ambulatory Visit: Payer: Federal, State, Local not specified - PPO | Admitting: Student

## 2023-03-21 VITALS — BP 145/94 | HR 99 | Ht 62.0 in | Wt 186.4 lb

## 2023-03-21 DIAGNOSIS — E66811 Obesity, class 1: Secondary | ICD-10-CM

## 2023-03-21 DIAGNOSIS — I10 Essential (primary) hypertension: Secondary | ICD-10-CM

## 2023-03-21 DIAGNOSIS — R7303 Prediabetes: Secondary | ICD-10-CM | POA: Diagnosis not present

## 2023-03-21 MED ORDER — WEGOVY 0.5 MG/0.5ML ~~LOC~~ SOAJ: 0.5000 mg | SUBCUTANEOUS | 1 refills | Status: DC

## 2023-03-21 MED ORDER — AMLODIPINE BESYLATE-VALSARTAN 10-160 MG PO TABS: 1.0000 | ORAL_TABLET | Freq: Every day | ORAL | 0 refills | Status: DC

## 2023-03-21 NOTE — Patient Instructions (Addendum)
It was great to see you today! Thank you for choosing [facility] for your primary care.  Today we addressed: Increasing Wegovy dosage. Please finish your current supply of the 0.25 mg weekly and then start at the increased dosage. Proud of you being so strong on your weight loss journey.   If you haven't already, sign up for My Chart to have easy access to your labs results, and communication with your primary care physician.  Return in about 7 weeks (around 05/09/2023) for Weight loss and HTN f/u. Please arrive 15 minutes before your appointment to ensure smooth check in process.  We appreciate your efforts in making this happen.  Thank you for allowing me to participate in your care, Shelby Mattocks, DO 03/21/2023, 11:49 AM PGY-3, [facility]

## 2023-03-22 NOTE — Assessment & Plan Note (Signed)
Reviewed somewhat stable weight between 175-185 over the last few years.  Increase medication to Wegovy 0.5 mg weekly.  Consider formal consult to healthy weight and wellness or nutritionist at next visit.

## 2023-03-22 NOTE — Assessment & Plan Note (Signed)
BP: (!) 145/94 today. Poorly controlled. Goal of <130/80. Continue to work on healthy dietary habits and exercise. Medication regimen: Increase to amlodipine-valsartan 10-160 mg daily.  BMP in 1 week.

## 2023-03-23 ENCOUNTER — Other Ambulatory Visit (HOSPITAL_COMMUNITY): Payer: Self-pay

## 2023-03-23 NOTE — Telephone Encounter (Signed)
Sent mychart message to patient to see if she was able to pick this up.   Will await her response.

## 2023-03-27 ENCOUNTER — Other Ambulatory Visit (HOSPITAL_COMMUNITY): Payer: Self-pay

## 2023-03-27 ENCOUNTER — Encounter: Payer: Self-pay | Admitting: Hematology and Oncology

## 2023-03-28 ENCOUNTER — Ambulatory Visit: Payer: Federal, State, Local not specified - PPO

## 2023-03-28 ENCOUNTER — Encounter: Payer: Self-pay | Admitting: Student

## 2023-03-28 DIAGNOSIS — I10 Essential (primary) hypertension: Secondary | ICD-10-CM | POA: Diagnosis not present

## 2023-03-28 DIAGNOSIS — R7303 Prediabetes: Secondary | ICD-10-CM

## 2023-03-28 DIAGNOSIS — E66811 Obesity, class 1: Secondary | ICD-10-CM

## 2023-03-28 MED ORDER — WEGOVY 0.5 MG/0.5ML ~~LOC~~ SOAJ: 0.5000 mg | SUBCUTANEOUS | 1 refills | Status: DC

## 2023-03-29 LAB — BASIC METABOLIC PANEL
BUN/Creatinine Ratio: 21 (ref 9–23)
BUN: 14 mg/dL (ref 6–24)
CO2: 23 mmol/L (ref 20–29)
Calcium: 9.9 mg/dL (ref 8.7–10.2)
Chloride: 100 mmol/L (ref 96–106)
Creatinine, Ser: 0.66 mg/dL (ref 0.57–1.00)
Glucose: 87 mg/dL (ref 70–99)
Potassium: 4.1 mmol/L (ref 3.5–5.2)
Sodium: 140 mmol/L (ref 134–144)
eGFR: 114 mL/min/{1.73_m2} (ref >59)

## 2023-04-03 ENCOUNTER — Inpatient Hospital Stay: Payer: Federal, State, Local not specified - PPO | Attending: Hematology and Oncology

## 2023-04-03 ENCOUNTER — Inpatient Hospital Stay: Payer: Federal, State, Local not specified - PPO

## 2023-04-03 VITALS — BP 147/94 | HR 106 | Resp 16

## 2023-04-03 DIAGNOSIS — Z1721 Progesterone receptor positive status: Secondary | ICD-10-CM | POA: Diagnosis not present

## 2023-04-03 DIAGNOSIS — Z79899 Other long term (current) drug therapy: Secondary | ICD-10-CM | POA: Diagnosis not present

## 2023-04-03 DIAGNOSIS — Z170 Estrogen receptor positive status [ER+]: Secondary | ICD-10-CM | POA: Diagnosis not present

## 2023-04-03 DIAGNOSIS — C50811 Malignant neoplasm of overlapping sites of right female breast: Secondary | ICD-10-CM | POA: Insufficient documentation

## 2023-04-03 DIAGNOSIS — Z5111 Encounter for antineoplastic chemotherapy: Secondary | ICD-10-CM | POA: Diagnosis not present

## 2023-04-03 MED ORDER — GOSERELIN ACETATE 3.6 MG ~~LOC~~ IMPL
3.6000 mg | DRUG_IMPLANT | Freq: Once | SUBCUTANEOUS | Status: AC
  Administered 2023-04-03: 3.6 mg via SUBCUTANEOUS
  Filled 2023-04-03: qty 3.6

## 2023-04-07 ENCOUNTER — Other Ambulatory Visit: Payer: Self-pay | Admitting: Family Medicine

## 2023-04-19 ENCOUNTER — Other Ambulatory Visit: Payer: Self-pay | Admitting: Family Medicine

## 2023-04-19 DIAGNOSIS — F411 Generalized anxiety disorder: Secondary | ICD-10-CM

## 2023-04-22 ENCOUNTER — Encounter: Payer: Self-pay | Admitting: Adult Health

## 2023-05-01 ENCOUNTER — Inpatient Hospital Stay: Payer: Federal, State, Local not specified - PPO

## 2023-05-01 ENCOUNTER — Encounter: Payer: Self-pay | Admitting: Hematology and Oncology

## 2023-05-01 ENCOUNTER — Inpatient Hospital Stay: Payer: Federal, State, Local not specified - PPO | Attending: Hematology and Oncology

## 2023-05-01 VITALS — BP 141/91 | HR 108 | Temp 98.5°F | Resp 18

## 2023-05-01 DIAGNOSIS — Z5111 Encounter for antineoplastic chemotherapy: Secondary | ICD-10-CM | POA: Diagnosis present

## 2023-05-01 DIAGNOSIS — Z170 Estrogen receptor positive status [ER+]: Secondary | ICD-10-CM | POA: Insufficient documentation

## 2023-05-01 DIAGNOSIS — C50811 Malignant neoplasm of overlapping sites of right female breast: Secondary | ICD-10-CM | POA: Insufficient documentation

## 2023-05-01 DIAGNOSIS — Z1721 Progesterone receptor positive status: Secondary | ICD-10-CM | POA: Insufficient documentation

## 2023-05-01 DIAGNOSIS — Z79899 Other long term (current) drug therapy: Secondary | ICD-10-CM | POA: Diagnosis not present

## 2023-05-01 MED ORDER — GOSERELIN ACETATE 3.6 MG ~~LOC~~ IMPL
3.6000 mg | DRUG_IMPLANT | Freq: Once | SUBCUTANEOUS | Status: AC
  Administered 2023-05-01: 3.6 mg via SUBCUTANEOUS
  Filled 2023-05-01: qty 3.6

## 2023-05-02 ENCOUNTER — Telehealth: Payer: Self-pay | Admitting: *Deleted

## 2023-05-02 ENCOUNTER — Telehealth: Payer: Self-pay

## 2023-05-02 NOTE — Telephone Encounter (Signed)
Notified patient that her [employer] FMLA documents had been completed placed up front for pick-up as requested. No further concerns at this time.

## 2023-05-02 NOTE — Telephone Encounter (Signed)
RN placed call to pt to f/u on mychart message received yesterday regarding severe joint pain and depression.  Pt denies any suicidal ideations and states she is currently taking Wellbutrin 150 mg daily and has an appt next week with PCP to increase dosage.  Pt also states joint pain is resolving and has discovered the pain is more muscular in nature due to increase in exercise and weight lifting.  Pt educated to rest the next few days and to contact our office if pain becomes worse or depression becomes worse.  Pt verbalized understanding.

## 2023-05-08 ENCOUNTER — Encounter: Payer: Self-pay | Admitting: Student

## 2023-05-08 ENCOUNTER — Other Ambulatory Visit: Payer: Self-pay | Admitting: Student

## 2023-05-08 ENCOUNTER — Ambulatory Visit (INDEPENDENT_AMBULATORY_CARE_PROVIDER_SITE_OTHER): Payer: Federal, State, Local not specified - PPO | Admitting: Student

## 2023-05-08 VITALS — BP 121/80 | HR 113 | Ht 62.0 in | Wt 185.0 lb

## 2023-05-08 DIAGNOSIS — F411 Generalized anxiety disorder: Secondary | ICD-10-CM | POA: Diagnosis not present

## 2023-05-08 DIAGNOSIS — R7303 Prediabetes: Secondary | ICD-10-CM

## 2023-05-08 DIAGNOSIS — I10 Essential (primary) hypertension: Secondary | ICD-10-CM

## 2023-05-08 DIAGNOSIS — E66811 Obesity, class 1: Secondary | ICD-10-CM | POA: Diagnosis not present

## 2023-05-08 MED ORDER — SERTRALINE HCL 25 MG PO TABS: 25.0000 mg | ORAL_TABLET | Freq: Every day | ORAL | 0 refills | Status: DC

## 2023-05-08 MED ORDER — SEMAGLUTIDE-WEIGHT MANAGEMENT 1 MG/0.5ML ~~LOC~~ SOAJ: 1.0000 mg | SUBCUTANEOUS | 0 refills | Status: DC

## 2023-05-08 MED ORDER — SEMAGLUTIDE-WEIGHT MANAGEMENT 1 MG/0.5ML ~~LOC~~ SOAJ: 1.0000 mg | SUBCUTANEOUS | 0 refills | Status: AC

## 2023-05-08 MED ORDER — HYDROXYZINE HCL 10 MG PO TABS: 10.0000 mg | ORAL_TABLET | Freq: Three times a day (TID) | ORAL | 0 refills | Status: DC | PRN

## 2023-05-08 NOTE — Assessment & Plan Note (Signed)
Increase Wegovy to 1 mg weekly.  She is tolerating her current dosage well.

## 2023-05-08 NOTE — Assessment & Plan Note (Addendum)
BP: 121/80 today. Well controlled. Goal of <130/80. Continue to work on healthy dietary habits and exercise. Medication regimen: Amlodipine-valsartan 10-160 mg daily.

## 2023-05-08 NOTE — Progress Notes (Addendum)
  SUBJECTIVE:   CHIEF COMPLAINT / HPI:   Hypertension: Home medications include: Amlodipine-valsartan 10-160 mg daily. She endorses taking these medications as prescribed. Patient has had a BMP in the past 1 year.  Anxiety: She presents today with request for renewal of FMLA paperwork and work restriction.  Most recently filled out in June by Dr. Larita Fife.  She works 5 days a week for shift of 6:45 AM to 3:15 PM.  She notes she cries about every day and has such anxiety since her diagnosis of breast cancer that even during work is hard for her to function.  This is also the time of the year that she grieves her parents, her mother passed away in 2021/06/02.  The Wellbutrin made her feel worse.  She is requesting change in medication and would like to try Zoloft.  She is not currently established with therapist.  Her insurance will be changing in January so she is unable to search for therapist that will be covered by her insurance until then.  Denies SI/HI  [employer] Visit from 05/08/2023 in [facility] . [facility]  PHQ-9 Total Score 20         05/08/2023    8:58 AM 10/26/2022    2:50 PM 03/07/2021    9:48 AM 09/15/2020    2:58 PM  GAD 7 : Generalized Anxiety Score  Nervous, Anxious, on Edge 3 3 3 3   Control/stop worrying 3 3 3 3   Worry too much - different things 3 3 3 3   Trouble relaxing 3 2 3 3   Restless 0 0 0 3  Easily annoyed or irritable 3 3 3 3   Afraid - awful might happen 3 3 3 3   Total GAD 7 Score 18 17 18 21   Anxiety Difficulty Very difficult Extremely difficult Extremely difficult Extremely difficult   PERTINENT  PMH / PSH: HTN, prediabetes, HLD, breast cancer, MDD  OBJECTIVE:  BP 121/80   Pulse (!) 113   Ht 5\' 2"  (1.575 m)   Wt 185 lb (83.9 kg)   BMI 33.84 kg/m  General: Well-appearing, NAD CV: RRR, no murmurs auscultated Psych: Tearful, sadness  ASSESSMENT/PLAN:   Assessment & Plan GAD (generalized anxiety  disorder) Uncontrolled, PHQ-9 score of 20, GAD score of 18. I suspect there may be comorbid depression involved as well.  Requesting renewal of FMLA for reduced hours.  I will complete FMLA paperwork and work restriction, continued as her current plan.  This is under the agreement that she will pursue therapy weekly.  Additionally, she will see me monthly for tailored medication regimen.  Start Zoloft 25 mg daily.  Follow-up in 1 month. Obesity (BMI 30.0-34.9) Increase Wegovy to 1 mg weekly.  She is tolerating her current dosage well. Primary hypertension BP: 121/80 today. Well controlled. Goal of <130/80. Continue to work on healthy dietary habits and exercise. Medication regimen: Amlodipine-valsartan 10-160 mg daily. Return in about 1 month (around 06/08/2023) for anxiety follow up. Shelby Mattocks, DO 05/08/2023, 9:59 AM PGY-3,  Family Medicine

## 2023-05-08 NOTE — Patient Instructions (Addendum)
It was great to see you today! Thank you for choosing [facility] for your primary care.  Today we addressed: Anxiety: I will fill out your FMLA paperwork and get back to you within a week.  We have started you on Zoloft.  We should increase this every 4 to 6 weeks until you are receiving some benefit.  At the turn of the year when your insurance changes, please attempt to establish with a therapist weekly.  These things are required in order to move forward with your FMLA.  I would like for you to see me monthly regarding your medication plan. We have increased your Wegovy to 1 mg weekly. Your blood pressure is in good parameters.  If you haven't already, sign up for My Chart to have easy access to your labs results, and communication with your primary care physician.  Return in about 1 month (around 06/08/2023) for anxiety follow up. Please arrive 15 minutes before your appointment to ensure smooth check in process.  We appreciate your efforts in making this happen.  Thank you for allowing me to participate in your care, Shelby Mattocks, DO 05/08/2023, 8:54 AM PGY-3, [facility]

## 2023-05-08 NOTE — Assessment & Plan Note (Addendum)
Uncontrolled, PHQ-9 score of 20, GAD score of 18. I suspect there may be comorbid depression involved as well.  Requesting renewal of FMLA for reduced hours.  I will complete FMLA paperwork and work restriction, continued as her current plan.  This is under the agreement that she will pursue therapy weekly.  Additionally, she will see me monthly for tailored medication regimen.  Start Zoloft 25 mg daily.  Follow-up in 1 month.

## 2023-05-09 ENCOUNTER — Encounter: Payer: Self-pay | Admitting: Student

## 2023-05-10 ENCOUNTER — Encounter: Payer: Self-pay | Admitting: Student

## 2023-05-11 NOTE — Telephone Encounter (Signed)
Forms placed up front for pick up.   Copy made for batch scanning.   Patient is aware.

## 2023-05-12 ENCOUNTER — Other Ambulatory Visit: Payer: Self-pay | Admitting: Student

## 2023-05-12 DIAGNOSIS — I10 Essential (primary) hypertension: Secondary | ICD-10-CM

## 2023-05-12 LAB — SIGNATERA
SIGNATERA MTM READOUT: 0 MTM/ml
SIGNATERA TEST RESULT: NEGATIVE

## 2023-05-13 ENCOUNTER — Other Ambulatory Visit: Payer: Self-pay | Admitting: Student

## 2023-05-13 DIAGNOSIS — R7303 Prediabetes: Secondary | ICD-10-CM

## 2023-05-13 DIAGNOSIS — E66811 Obesity, class 1: Secondary | ICD-10-CM

## 2023-05-18 ENCOUNTER — Telehealth: Payer: Self-pay

## 2023-05-18 NOTE — Telephone Encounter (Signed)
Attempted to call pt regarding signatera results lvm for pt to return call back to receive results. Results were negative.

## 2023-05-24 ENCOUNTER — Encounter: Payer: Self-pay | Admitting: Hematology and Oncology

## 2023-05-28 ENCOUNTER — Telehealth: Payer: Self-pay | Admitting: Family Medicine

## 2023-05-28 NOTE — Telephone Encounter (Signed)
 Reviewed form and placed in PCP's box for completion.  Glennie Hawk, CMA

## 2023-05-28 NOTE — Telephone Encounter (Signed)
 Patient's husband dropped off FMLA paperwork to be completed. Last DOS was 05/08/23. Placed in [employer].

## 2023-05-29 ENCOUNTER — Ambulatory Visit: Payer: Federal, State, Local not specified - PPO | Admitting: Family Medicine

## 2023-05-29 ENCOUNTER — Inpatient Hospital Stay: Payer: Commercial Managed Care - PPO | Attending: Hematology and Oncology

## 2023-05-29 ENCOUNTER — Encounter: Payer: Self-pay | Admitting: Hematology and Oncology

## 2023-05-29 VITALS — BP 130/85 | HR 93 | Temp 98.2°F | Resp 18

## 2023-05-29 DIAGNOSIS — C50811 Malignant neoplasm of overlapping sites of right female breast: Secondary | ICD-10-CM | POA: Insufficient documentation

## 2023-05-29 DIAGNOSIS — Z5111 Encounter for antineoplastic chemotherapy: Secondary | ICD-10-CM | POA: Insufficient documentation

## 2023-05-29 DIAGNOSIS — Z79899 Other long term (current) drug therapy: Secondary | ICD-10-CM | POA: Insufficient documentation

## 2023-05-29 DIAGNOSIS — Z170 Estrogen receptor positive status [ER+]: Secondary | ICD-10-CM | POA: Insufficient documentation

## 2023-05-29 MED ORDER — GOSERELIN ACETATE 3.6 MG ~~LOC~~ IMPL
3.6000 mg | DRUG_IMPLANT | Freq: Once | SUBCUTANEOUS | Status: AC
  Administered 2023-05-29: 3.6 mg via SUBCUTANEOUS
  Filled 2023-05-29: qty 3.6

## 2023-05-29 NOTE — Telephone Encounter (Signed)
 Mychart message sent to patient and informed that forms are ready for pick up. Copy made and placed in batch scanning. Original placed at front desk for pick up.   Veronda Prude, RN

## 2023-05-30 ENCOUNTER — Other Ambulatory Visit: Payer: Self-pay | Admitting: Student

## 2023-05-30 DIAGNOSIS — F411 Generalized anxiety disorder: Secondary | ICD-10-CM

## 2023-06-01 ENCOUNTER — Ambulatory Visit: Payer: Federal, State, Local not specified - PPO | Admitting: Family Medicine

## 2023-06-05 ENCOUNTER — Other Ambulatory Visit: Payer: Self-pay | Admitting: Student

## 2023-06-05 DIAGNOSIS — F411 Generalized anxiety disorder: Secondary | ICD-10-CM

## 2023-06-06 ENCOUNTER — Encounter: Payer: Self-pay | Admitting: Student

## 2023-06-08 ENCOUNTER — Encounter: Payer: Self-pay | Admitting: Hematology and Oncology

## 2023-06-08 ENCOUNTER — Telehealth: Payer: Self-pay

## 2023-06-08 ENCOUNTER — Other Ambulatory Visit (HOSPITAL_COMMUNITY): Payer: Self-pay

## 2023-06-08 NOTE — Telephone Encounter (Signed)
Pharmacy Patient Advocate Encounter   Received notification from Patient Advice Request messages that prior authorization for WEGOVY (0.5MG ) is required/requested.   Insurance verification completed.   The patient is insured through [employer] [facility] .   PA required; PA submitted to above mentioned insurance via CoverMyMeds Key/confirmation #/EOC [facility]. Status is pending

## 2023-06-10 ENCOUNTER — Other Ambulatory Visit: Payer: Self-pay | Admitting: Student

## 2023-06-11 ENCOUNTER — Encounter: Payer: Self-pay | Admitting: Hematology and Oncology

## 2023-06-11 ENCOUNTER — Other Ambulatory Visit (HOSPITAL_COMMUNITY): Payer: Self-pay

## 2023-06-11 NOTE — Telephone Encounter (Signed)
Pharmacy Patient Advocate Encounter  Received notification from [employer] [facility] that Prior Authorization for [facility] 0.5MG  has been DENIED.  Full denial letter will be uploaded to the media tab. See denial reason below.    Most likely due to patient not having lost 5% of baseline weight (since starting medication).

## 2023-06-12 ENCOUNTER — Other Ambulatory Visit: Payer: Self-pay | Admitting: Student

## 2023-06-12 DIAGNOSIS — E66811 Obesity, class 1: Secondary | ICD-10-CM

## 2023-06-12 MED ORDER — TIRZEPATIDE-WEIGHT MANAGEMENT 2.5 MG/0.5ML ~~LOC~~ SOLN: 2.5000 mg | SUBCUTANEOUS | 0 refills | Status: DC

## 2023-06-13 ENCOUNTER — Telehealth: Payer: Self-pay

## 2023-06-13 ENCOUNTER — Other Ambulatory Visit (HOSPITAL_COMMUNITY): Payer: Self-pay

## 2023-06-13 ENCOUNTER — Other Ambulatory Visit: Payer: Self-pay | Admitting: Student

## 2023-06-13 ENCOUNTER — Encounter: Payer: Self-pay | Admitting: Hematology and Oncology

## 2023-06-13 DIAGNOSIS — E66811 Obesity, class 1: Secondary | ICD-10-CM

## 2023-06-13 NOTE — Telephone Encounter (Signed)
Patient calls nurse line in regards to Zepbound.  She reports the medication was approved by her insurance, however the vials were sent in and not the pens.   She reports she wants the pens and not the vials.   Will forward to provider to resend in pens.

## 2023-06-13 NOTE — Telephone Encounter (Signed)
Pharmacy Patient Advocate Encounter   Received notification from Patient Advice Request messages that prior authorization for ZEPBOUND is required/requested.   Insurance verification completed.   The patient is insured through [employer] .   Per test claim: PA required; PA submitted to above mentioned insurance via CoverMyMeds Key/confirmation #/EOC [id number]. Status is pending  Attached recent BMI

## 2023-06-14 MED ORDER — ZEPBOUND 2.5 MG/0.5ML ~~LOC~~ SOAJ: 2.5000 mg | SUBCUTANEOUS | 2 refills | Status: DC

## 2023-06-14 MED ORDER — ZEPBOUND 2.5 MG/0.5ML ~~LOC~~ SOAJ: 2.5000 mg | SUBCUTANEOUS | 0 refills | Status: DC

## 2023-06-14 NOTE — Telephone Encounter (Signed)
Pharmacy Patient Advocate Encounter  Received notification from [facility] that Prior Authorization for ZEPBOUND 2.5MG  has been APPROVED from 06/13/23 to 02/11/24.   PA #/Case ID/Reference #: [id number]

## 2023-06-18 ENCOUNTER — Encounter: Payer: Self-pay | Admitting: Hematology and Oncology

## 2023-06-18 ENCOUNTER — Ambulatory Visit
Admission: RE | Admit: 2023-06-18 | Discharge: 2023-06-18 | Disposition: A | Discharge status: Home / Self Care | Payer: Commercial Managed Care - PPO | Source: Ambulatory Visit | Attending: Adult Health | Admitting: Adult Health

## 2023-06-18 DIAGNOSIS — Z1239 Encounter for other screening for malignant neoplasm of breast: Secondary | ICD-10-CM

## 2023-06-19 ENCOUNTER — Ambulatory Visit (INDEPENDENT_AMBULATORY_CARE_PROVIDER_SITE_OTHER): Payer: Federal, State, Local not specified - PPO | Admitting: Student

## 2023-06-19 VITALS — BP 125/85 | Ht 62.0 in | Wt 184.2 lb

## 2023-06-19 DIAGNOSIS — F411 Generalized anxiety disorder: Secondary | ICD-10-CM

## 2023-06-19 NOTE — Assessment & Plan Note (Signed)
Although PHQ 9 and GAD-7 were deferred, patient subjectively feels Zoloft 25 mg daily is working for her.  We have agreed to not change medication regimen.  Immediate life stressors continue to be the major driver.  She is established with therapy to discuss eating habits and triggers of anxiety.  She will continue to see me monthly for medication management.

## 2023-06-19 NOTE — Progress Notes (Signed)
  SUBJECTIVE:   CHIEF COMPLAINT / HPI:   Anxiety: Follow-up today regarding her anxiety which is a to be monthly per her plan of receiving FMLA.  She is very anxious today as she has been unable to access MyChart to get the results of her mammogram that was performed yesterday.  When advised that her mammogram was normal, she broke down with tears of joy.  She has established with a therapist to start 2/17. She will also be talking with this therapist for her eating habits as she eats more when she is stressed and doesn't eat at all when she's depressed. She feels that the Zoloft 25mg  has helped her.  She deferred PHQ-9 and GAD-7.  PERTINENT  PMH / PSH: HTN, prediabetes, HLD, breast cancer, MDD   OBJECTIVE:  BP 125/85   Ht 5\' 2"  (1.575 m)   Wt 184 lb 3.2 oz (83.6 kg)   BMI 33.69 kg/m  General: Well-appearing, NAD Psych: Anxious mood, normal affect  ASSESSMENT/PLAN:   Assessment & Plan GAD (generalized anxiety disorder) Although PHQ 9 and GAD-7 were deferred, patient subjectively feels Zoloft 25 mg daily is working for her.  We have agreed to not change medication regimen.  Immediate life stressors continue to be the major driver.  She is established with therapy to discuss eating habits and triggers of anxiety.  She will continue to see me monthly for medication management. Return in about 1 month (around 07/20/2023) for Anxiety follow-up. Shelby Mattocks, DO 06/19/2023, 4:57 PM PGY-3, [facility]

## 2023-06-19 NOTE — Patient Instructions (Signed)
Patient declined AVS.  No change to medications today.  We discussed her normal mammogram results and that you are established to see therapy mid February and will continue to see me monthly for medication management.

## 2023-06-26 ENCOUNTER — Inpatient Hospital Stay: Payer: Commercial Managed Care - PPO | Attending: Hematology and Oncology

## 2023-06-26 VITALS — BP 132/89 | HR 90 | Temp 98.4°F | Resp 18

## 2023-06-26 DIAGNOSIS — Z79899 Other long term (current) drug therapy: Secondary | ICD-10-CM | POA: Insufficient documentation

## 2023-06-26 DIAGNOSIS — Z170 Estrogen receptor positive status [ER+]: Secondary | ICD-10-CM | POA: Diagnosis not present

## 2023-06-26 DIAGNOSIS — Z5111 Encounter for antineoplastic chemotherapy: Secondary | ICD-10-CM | POA: Insufficient documentation

## 2023-06-26 DIAGNOSIS — C50811 Malignant neoplasm of overlapping sites of right female breast: Secondary | ICD-10-CM | POA: Insufficient documentation

## 2023-06-26 MED ORDER — GOSERELIN ACETATE 3.6 MG ~~LOC~~ IMPL
3.6000 mg | DRUG_IMPLANT | Freq: Once | SUBCUTANEOUS | Status: AC
  Administered 2023-06-26: 3.6 mg via SUBCUTANEOUS
  Filled 2023-06-26: qty 3.6

## 2023-06-27 ENCOUNTER — Ambulatory Visit: Payer: Federal, State, Local not specified - PPO | Admitting: Family Medicine

## 2023-07-07 ENCOUNTER — Telehealth: Payer: Commercial Managed Care - PPO | Admitting: Family Medicine

## 2023-07-07 DIAGNOSIS — R6889 Other general symptoms and signs: Secondary | ICD-10-CM

## 2023-07-07 MED ORDER — OSELTAMIVIR PHOSPHATE 75 MG PO CAPS: 75.0000 mg | ORAL_CAPSULE | Freq: Two times a day (BID) | ORAL | 0 refills | Status: AC

## 2023-07-07 NOTE — Progress Notes (Signed)
Virtual Visit Consent   Brooke Marks, you are scheduled for a virtual visit with a [facility] provider today. Just as with appointments in the office, your consent must be obtained to participate. Your consent will be active for this visit and any virtual visit you may have with one of our providers in the next 365 days. If you have a MyChart account, a copy of this consent can be sent to you electronically.  As this is a virtual visit, video technology does not allow for your provider to perform a traditional examination. This may limit your provider's ability to fully assess your condition. If your provider identifies any concerns that need to be evaluated in person or the need to arrange testing (such as labs, EKG, etc.), we will make arrangements to do so. Although advances in technology are sophisticated, we cannot ensure that it will always work on either your end or our end. If the connection with a video visit is poor, the visit may have to be switched to a telephone visit. With either a video or telephone visit, we are not always able to ensure that we have a secure connection.  By engaging in this virtual visit, you consent to the provision of healthcare and authorize for your insurance to be billed (if applicable) for the services provided during this visit. Depending on your insurance coverage, you may receive a charge related to this service.  I need to obtain your verbal consent now. Are you willing to proceed with your visit today? Brooke Marks has provided verbal consent on 07/07/2023 for a virtual visit (video or telephone). Reed Pandy, [place]  Date: 07/07/2023 10:21 AM   Virtual Visit via Video Note   I, Reed Pandy, connected with  Brooke Marks  ([id number], 19-Jun-1980) on 07/07/23 at 10:15 AM EST by a video-enabled telemedicine application and verified that I am speaking with the correct person using two identifiers.  Location: Patient: Virtual Visit  Location Patient: Home Provider: Virtual Visit Location Provider: Home Office   I discussed the limitations of evaluation and management by telemedicine and the availability of in person appointments. The patient expressed understanding and agreed to proceed.    History of Present Illness: Brooke Marks is a 43 y.o. who identifies as a female who was assigned female at birth, and is being seen today for c/o her daughter was diagnosed with influenza A and now she is not feeling well.  Pt states daughter was diagnosed last night.  Pt states she has body aches, throat pain, headache and eyes are burning.  Pt states she works from home and is only around her husband and daughter. Pt states she has not had any fevers. Pt states she has cough congestion and chills.   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 03/30/2019   Hypertension 01/27/2016   S/P cesarean section 01/27/2016   GAD (generalized anxiety disorder) 04/13/2015   CARPAL TUNNEL SYNDROME, LEFT 04/11/2010   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   Obesity (BMI 30.0-34.9) 07/19/2006    Allergies: No Known  Allergies Medications:  Current Outpatient Medications:    oseltamivir (TAMIFLU) 75 MG capsule, Take 1 capsule (75 mg total) by mouth 2 (two) times daily for 5 days., Disp: 10 capsule, Rfl: 0   amLODipine-valsartan (EXFORGE) 10-160 MG tablet, TAKE 1 TABLET BY MOUTH EVERY DAY, Disp: 90 tablet, Rfl: 3   anastrozole (ARIMIDEX) 1 MG tablet, Take 1 tablet (1 mg total) by mouth daily., Disp:  90 tablet, Rfl: 3   goserelin (ZOLADEX) 3.6 MG injection, Inject 3.6 mg into the skin every 28 (twenty-eight) days., Disp: , Rfl:    hydrOXYzine (ATARAX) 10 MG tablet, TAKE 1 TABLET BY MOUTH THREE TIMES A DAY AS NEEDED, Disp: 60 tablet, Rfl: 0   Multiple Vitamin (MULTIVITAMIN) tablet, Take 1 tablet by mouth daily., Disp: , Rfl:    sertraline (ZOLOFT) 25 MG tablet, TAKE 1 TABLET (25 MG TOTAL) BY MOUTH DAILY., Disp: 90 tablet, Rfl: 0   tirzepatide (ZEPBOUND) 2.5 MG/0.5ML Pen, Inject 2.5 mg into the skin once a week., Disp: 6 mL, Rfl: 0  Observations/Objective: Patient is well-developed, well-nourished in no acute distress.  Resting comfortably at home.  Head is normocephalic, atraumatic.  No labored breathing.  Speech is clear and coherent with logical content.  Patient is alert and oriented at baseline.    Assessment and Plan: 1. Flu-like symptoms (Primary) - oseltamivir (TAMIFLU) 75 MG capsule; Take 1 capsule (75 mg total) by mouth 2 (two) times daily for 5 days.  Dispense: 10 capsule; Refill: 0  -Start Tamiflu -Pt to follow up with PCP or urgent care for worsening symptoms  -Continue conservative measures  Follow Up Instructions: I discussed the assessment and treatment plan with the patient. The patient was provided an opportunity to ask questions and all were answered. The patient agreed with the plan and demonstrated an understanding of the instructions.  A copy of instructions were sent to the patient via MyChart unless otherwise noted below.    The patient was advised to call back or seek an in-person evaluation if the symptoms worsen or if the condition fails to improve as anticipated.    Reed Pandy, PA-C

## 2023-07-07 NOTE — Patient Instructions (Signed)
Brooke Marks, thank you for joining Reed Pandy, PA-C for today's virtual visit.  While this provider is not your primary care provider (PCP), if your PCP is located in our provider database this encounter information will be shared with them immediately following your visit.   A [facility] MyChart account gives you access to today's visit and all your visits, tests, and labs performed at [facility] " click here if you don't have a [facility] MyChart account or go to [email or web address]  Consent: (Patient) Brooke Marks provided verbal consent for this virtual visit at the beginning of the encounter.  Current Medications:  Current Outpatient Medications:    oseltamivir (TAMIFLU) 75 MG capsule, Take 1 capsule (75 mg total) by mouth 2 (two) times daily for 5 days., Disp: 10 capsule, Rfl: 0   amLODipine-valsartan (EXFORGE) 10-160 MG tablet, TAKE 1 TABLET BY MOUTH EVERY DAY, Disp: 90 tablet, Rfl: 3   anastrozole (ARIMIDEX) 1 MG tablet, Take 1 tablet (1 mg total) by mouth daily., Disp: 90 tablet, Rfl: 3   goserelin (ZOLADEX) 3.6 MG injection, Inject 3.6 mg into the skin every 28 (twenty-eight) days., Disp: , Rfl:    hydrOXYzine (ATARAX) 10 MG tablet, TAKE 1 TABLET BY MOUTH THREE TIMES A DAY AS NEEDED, Disp: 60 tablet, Rfl: 0   Multiple Vitamin (MULTIVITAMIN) tablet, Take 1 tablet by mouth daily., Disp: , Rfl:    sertraline (ZOLOFT) 25 MG tablet, TAKE 1 TABLET (25 MG TOTAL) BY MOUTH DAILY., Disp: 90 tablet, Rfl: 0   tirzepatide (ZEPBOUND) 2.5 MG/0.5ML Pen, Inject 2.5 mg into the skin once a week., Disp: 6 mL, Rfl: 0   Medications ordered in this encounter:  Meds ordered this encounter  Medications   oseltamivir (TAMIFLU) 75 MG capsule    Sig: Take 1 capsule (75 mg total) by mouth 2 (two) times daily for 5 days.    Dispense:  10 capsule    Refill:  0     *If you need refills on other medications prior to your next appointment, please contact your  pharmacy*  Follow-Up: Call back or seek an in-person evaluation if the symptoms worsen or if the condition fails to improve as anticipated.  [facility] [phone]  Other Instructions Influenza, Adult Influenza is also called the flu. It's an infection that affects your respiratory tract. This includes your nose, throat, windpipe, and lungs. The flu is contagious. This means it spreads easily from person to person. It causes symptoms that are like a cold. It can also cause a high fever and body aches. What are the causes? The flu is caused by the influenza virus. You can get it by: Breathing in droplets that are in the air after an infected person coughs or sneezes. Touching something that has the virus on it and then touching your mouth, nose, or eyes. What increases the risk? You may be more likely to get the flu if: You don't wash your hands often. You're near a lot of people during cold and flu season. You touch your mouth, eyes, or nose without washing your hands first. You don't get a flu shot each year. You may also be more at risk for the flu and serious problems, such as a lung infection called pneumonia, if: You're older than 65. You're pregnant. Your immune system is weak. Your immune system is your body's defense system. You have a long-term, or chronic, condition, such as: Heart, kidney, or lung disease. Diabetes. A liver disorder.  Asthma. You're very overweight. You have anemia. This is when you don't have enough red blood cells in your body. What are the signs or symptoms? Flu symptoms often start all of a sudden. They may last 4-14 days and include: Fever and chills. Headaches, body aches, or muscle aches. Sore throat. Cough. Runny or stuffy nose. Discomfort in your chest. Not wanting to eat as much as normal. Feeling weak or tired. Feeling dizzy. Nausea or vomiting. How is this diagnosed? The flu may be diagnosed based on your symptoms  and medical history. You may also have a physical exam. A swab may be taken from your nose or throat and tested for the virus. How is this treated? If the flu is found early, you can be treated with antiviral medicine. This may be given to you by mouth or through an IV. It can help you feel less sick and get better faster. Taking care of yourself at home can also help your symptoms get better. Your health care provider may tell you to: Take over-the-counter medicines. Drink lots of fluids. The flu often goes away on its own. If you have very bad symptoms or problems caused by the flu, you may need to be treated in a hospital. Follow these instructions at home: Activity Rest as needed. Get lots of sleep. Stay home from work or school as told by your provider. Leave home only to go see your provider. Do not leave home for other reasons until you don't have a fever for 24 hours without taking medicine. Eating and drinking Take an oral rehydration solution (ORS). This is a drink that is sold at pharmacies and stores. Drink enough fluid to keep your pee pale yellow. Try to drink small amounts of clear fluids. These include water, ice chips, fruit juice mixed with water, and low-calorie sports drinks. Try to eat bland foods that are easy to digest. These include bananas, applesauce, rice, lean meats, toast, and crackers. Avoid drinks that have a lot of sugar or caffeine in them. These include energy drinks, regular sports drinks, and soda. Do not drink alcohol. Do not eat spicy or fatty foods. General instructions     Take your medicines only as told by your provider. Use a cool mist humidifier to add moisture to the air in your home. This can make it easier for you to breathe. You should also clean the humidifier every day. To do so: Empty the water. Pour clean water in. Cover your mouth and nose when you cough or sneeze. Wash your hands with soap and water often and for at least 20 seconds.  It's extra important to do so after you cough or sneeze. If you can't use soap and water, use hand sanitizer. How is this prevented?  Get a flu shot every year. Ask your provider when you should get your flu shot. Stay away from people who are sick during fall and winter. Fall and winter are cold and flu season. Contact a health care provider if: You get new symptoms. You have chest pain. You have watery poop, also called diarrhea. You have a fever. Your cough gets worse. You start to have more mucus. You feel like you may vomit, or you vomit. Get help right away if: You become short of breath or have trouble breathing. Your skin or nails turn blue. You have very bad pain or stiffness in your neck. You get a sudden headache or pain in your face or ear. You vomit each time  you eat or drink. These symptoms may be an emergency. Call 911 right away. Do not wait to see if the symptoms will go away. Do not drive yourself to the hospital. This information is not intended to replace advice given to you by your health care provider. Make sure you discuss any questions you have with your health care provider. Document Revised: 02/08/2023 Document Reviewed: 06/15/2022 Elsevier Patient Education  2024 Elsevier Inc.   If you have been instructed to have an in-person evaluation today at a local Urgent Care facility, please use the link below. It will take you to a list of all of our available [facility], including address, phone number and hours of operation. Please do not delay care.  [facility]  If you or a family member do not have a primary care provider, use the link below to schedule a visit and establish care. When you choose a [facility] primary care physician or advanced practice provider, you gain a long-term partner in health. Find a Primary Care Provider  Learn more about [facility]'s in-office and virtual care options: [facility] - Get Care Now

## 2023-07-09 ENCOUNTER — Ambulatory Visit: Payer: Federal, State, Local not specified - PPO | Admitting: Family Medicine

## 2023-07-11 ENCOUNTER — Encounter: Payer: Self-pay | Admitting: Student

## 2023-07-12 ENCOUNTER — Other Ambulatory Visit: Payer: Self-pay | Admitting: Student

## 2023-07-12 DIAGNOSIS — E66811 Obesity, class 1: Secondary | ICD-10-CM

## 2023-07-12 MED ORDER — TIRZEPATIDE-WEIGHT MANAGEMENT 5 MG/0.5ML ~~LOC~~ SOLN
5.0000 mg | SUBCUTANEOUS | 0 refills | Status: DC
Start: 2023-07-12 — End: 2023-07-13

## 2023-07-13 ENCOUNTER — Other Ambulatory Visit: Payer: Self-pay | Admitting: Student

## 2023-07-13 DIAGNOSIS — E66811 Obesity, class 1: Secondary | ICD-10-CM

## 2023-07-13 MED ORDER — TIRZEPATIDE-WEIGHT MANAGEMENT 5 MG/0.5ML ~~LOC~~ SOAJ: 5.0000 mg | SUBCUTANEOUS | 0 refills | Status: DC

## 2023-07-20 ENCOUNTER — Telehealth: Payer: Self-pay | Admitting: Hematology and Oncology

## 2023-07-20 ENCOUNTER — Ambulatory Visit: Payer: Self-pay | Admitting: Student

## 2023-07-20 NOTE — Telephone Encounter (Signed)
 Patient is aware of changed appointment times on 07/24/2023 per secure chat on 07/20/2023

## 2023-07-24 ENCOUNTER — Other Ambulatory Visit: Payer: Self-pay | Admitting: Hematology and Oncology

## 2023-07-24 ENCOUNTER — Ambulatory Visit: Payer: Commercial Managed Care - PPO

## 2023-07-24 ENCOUNTER — Inpatient Hospital Stay: Payer: Commercial Managed Care - PPO | Attending: Hematology and Oncology

## 2023-07-24 ENCOUNTER — Telehealth: Payer: Self-pay | Admitting: Hematology and Oncology

## 2023-07-24 ENCOUNTER — Inpatient Hospital Stay: Attending: Hematology and Oncology

## 2023-07-24 VITALS — BP 130/93 | HR 93 | Temp 98.4°F | Resp 18

## 2023-07-24 DIAGNOSIS — Z79899 Other long term (current) drug therapy: Secondary | ICD-10-CM | POA: Insufficient documentation

## 2023-07-24 DIAGNOSIS — Z170 Estrogen receptor positive status [ER+]: Secondary | ICD-10-CM | POA: Insufficient documentation

## 2023-07-24 DIAGNOSIS — Z5111 Encounter for antineoplastic chemotherapy: Secondary | ICD-10-CM | POA: Insufficient documentation

## 2023-07-24 DIAGNOSIS — C50811 Malignant neoplasm of overlapping sites of right female breast: Secondary | ICD-10-CM | POA: Insufficient documentation

## 2023-07-24 MED ORDER — GOSERELIN ACETATE 3.6 MG ~~LOC~~ IMPL
3.6000 mg | DRUG_IMPLANT | Freq: Once | SUBCUTANEOUS | Status: AC
  Administered 2023-07-24: 3.6 mg via SUBCUTANEOUS
  Filled 2023-07-24: qty 3.6

## 2023-07-24 NOTE — Telephone Encounter (Signed)
 Patient called to push her appointment farther back. Patient is aware of the changes made.

## 2023-08-01 ENCOUNTER — Encounter: Payer: Self-pay | Admitting: Student

## 2023-08-01 MED ORDER — ZEPBOUND 7.5 MG/0.5ML ~~LOC~~ SOAJ: 7.5000 mg | SUBCUTANEOUS | 0 refills | Status: DC

## 2023-08-07 LAB — SIGNATERA
SIGNATERA MTM READOUT: 0 MTM/ml
SIGNATERA TEST RESULT: NEGATIVE

## 2023-08-13 ENCOUNTER — Encounter: Payer: Self-pay | Admitting: Student

## 2023-08-15 ENCOUNTER — Ambulatory Visit: Payer: Federal, State, Local not specified - PPO | Admitting: Student

## 2023-08-21 ENCOUNTER — Inpatient Hospital Stay: Payer: Federal, State, Local not specified - PPO | Attending: Hematology and Oncology

## 2023-08-21 VITALS — BP 129/81 | HR 82 | Temp 98.2°F | Resp 18

## 2023-08-21 DIAGNOSIS — Z79899 Other long term (current) drug therapy: Secondary | ICD-10-CM | POA: Insufficient documentation

## 2023-08-21 DIAGNOSIS — Z5111 Encounter for antineoplastic chemotherapy: Secondary | ICD-10-CM | POA: Insufficient documentation

## 2023-08-21 DIAGNOSIS — C50811 Malignant neoplasm of overlapping sites of right female breast: Secondary | ICD-10-CM | POA: Insufficient documentation

## 2023-08-21 DIAGNOSIS — Z170 Estrogen receptor positive status [ER+]: Secondary | ICD-10-CM | POA: Diagnosis not present

## 2023-08-21 MED ORDER — GOSERELIN ACETATE 3.6 MG ~~LOC~~ IMPL
3.6000 mg | DRUG_IMPLANT | Freq: Once | SUBCUTANEOUS | Status: AC
  Administered 2023-08-21: 3.6 mg via SUBCUTANEOUS
  Filled 2023-08-21: qty 3.6

## 2023-08-22 ENCOUNTER — Other Ambulatory Visit: Payer: Self-pay | Admitting: Hematology and Oncology

## 2023-08-23 ENCOUNTER — Other Ambulatory Visit: Payer: Self-pay | Admitting: Student

## 2023-09-18 ENCOUNTER — Inpatient Hospital Stay: Payer: Federal, State, Local not specified - PPO

## 2023-09-18 ENCOUNTER — Ambulatory Visit: Admitting: Student

## 2023-09-18 VITALS — BP 119/70 | Temp 98.9°F | Wt 176.8 lb

## 2023-09-18 VITALS — BP 132/88 | HR 94 | Temp 98.3°F | Resp 18

## 2023-09-18 DIAGNOSIS — F411 Generalized anxiety disorder: Secondary | ICD-10-CM | POA: Diagnosis not present

## 2023-09-18 DIAGNOSIS — E66811 Obesity, class 1: Secondary | ICD-10-CM | POA: Diagnosis not present

## 2023-09-18 DIAGNOSIS — C50811 Malignant neoplasm of overlapping sites of right female breast: Secondary | ICD-10-CM

## 2023-09-18 DIAGNOSIS — Z5111 Encounter for antineoplastic chemotherapy: Secondary | ICD-10-CM | POA: Diagnosis not present

## 2023-09-18 DIAGNOSIS — Z170 Estrogen receptor positive status [ER+]: Secondary | ICD-10-CM

## 2023-09-18 MED ORDER — ZEPBOUND 10 MG/0.5ML ~~LOC~~ SOAJ: 10.0000 mg | SUBCUTANEOUS | 1 refills | Status: DC

## 2023-09-18 MED ORDER — GOSERELIN ACETATE 3.6 MG ~~LOC~~ IMPL
3.6000 mg | DRUG_IMPLANT | Freq: Once | SUBCUTANEOUS | Status: AC
  Administered 2023-09-18: 3.6 mg via SUBCUTANEOUS
  Filled 2023-09-18: qty 3.6

## 2023-09-18 NOTE — Patient Instructions (Addendum)
 It was great to see you today! Thank you for choosing [facility] for your primary care.  Today we addressed: I have increased your Zepbound  to 10 mg weekly. Keep up the great work!  If you haven't already, sign up for My Chart to have easy access to your labs results, and communication with your primary care physician.  Return if symptoms worsen or fail to improve. Please arrive 15 minutes before your appointment to ensure smooth check in process.  We appreciate your efforts in making this happen.  Thank you for allowing me to participate in your care, Veronia Goon, DO 09/18/2023, 8:51 AM PGY-3, [facility]

## 2023-09-18 NOTE — Assessment & Plan Note (Addendum)
 Well-controlled at this time with strictly counseling, no medication.  Injections for her breast cancer are the most anxiety provoking times in her life.  She will continue therapy and follow-up as needed.

## 2023-09-18 NOTE — Assessment & Plan Note (Addendum)
 Doing well with Zepbound , increase to 10 mg weekly.

## 2023-09-18 NOTE — Progress Notes (Signed)
  SUBJECTIVE:   CHIEF COMPLAINT / HPI:   Anxiety: Monthly follow-up.  She remains on FMLA.  She continues with therapy and notes that she is working on adjusting her mindset to allow herself to control her life rather than her disease process.  She self discontinued Zoloft .  PHQ-9 score of 5 with negative question 9, GAD-7 score of 5 reflecting mild anxiety.  Weight loss: She notes she is doing very well with the Zepbound  and has reassuringly lost weight.  She is still eating 3 meals a week and exercising.  Requesting increase in Zepbound .  PERTINENT  PMH / PSH: HTN, prediabetes, HLD, breast cancer, MDD/anxiety   OBJECTIVE:  BP 119/70   Temp 98.9 F (37.2 C)   Wt 176 lb 12.8 oz (80.2 kg)   BMI 32.34 kg/m  General: Well-appearing, NAD Psych: Normal mood and affect, tearful when discussing the progress she has made  ASSESSMENT/PLAN:   Assessment & Plan GAD (generalized anxiety disorder) Well-controlled at this time with strictly counseling, no medication.  Injections for her breast cancer are the most anxiety provoking times in her life.  She will continue therapy and follow-up as needed. Obesity (BMI 30.0-34.9) Doing well with Zepbound , increase to 10 mg weekly. Return if symptoms worsen or fail to improve. Veronia Goon, DO 09/18/2023, 8:53 AM PGY-3, [facility]

## 2023-09-26 ENCOUNTER — Encounter: Payer: Self-pay | Admitting: Student

## 2023-09-27 ENCOUNTER — Other Ambulatory Visit: Payer: Self-pay | Admitting: *Deleted

## 2023-09-27 ENCOUNTER — Telehealth: Payer: Self-pay | Admitting: *Deleted

## 2023-09-27 DIAGNOSIS — Z170 Estrogen receptor positive status [ER+]: Secondary | ICD-10-CM

## 2023-09-27 NOTE — Telephone Encounter (Signed)
 Received call from patient stating she noticed a bump/lump on her left breast and she is concerned and would like to have this further evaluated with diag mammo/us /  She has a hx of right breast cancer. Orders placed and msg sent for an appt.

## 2023-10-16 ENCOUNTER — Ambulatory Visit: Payer: Federal, State, Local not specified - PPO

## 2023-10-17 ENCOUNTER — Inpatient Hospital Stay: Attending: Hematology and Oncology

## 2023-10-17 VITALS — BP 134/88 | HR 99 | Temp 98.4°F | Resp 17

## 2023-10-17 DIAGNOSIS — Z79899 Other long term (current) drug therapy: Secondary | ICD-10-CM | POA: Diagnosis not present

## 2023-10-17 DIAGNOSIS — Z5111 Encounter for antineoplastic chemotherapy: Secondary | ICD-10-CM | POA: Insufficient documentation

## 2023-10-17 DIAGNOSIS — C50811 Malignant neoplasm of overlapping sites of right female breast: Secondary | ICD-10-CM | POA: Insufficient documentation

## 2023-10-17 DIAGNOSIS — Z170 Estrogen receptor positive status [ER+]: Secondary | ICD-10-CM | POA: Diagnosis not present

## 2023-10-17 MED ORDER — GOSERELIN ACETATE 3.6 MG ~~LOC~~ IMPL
3.6000 mg | DRUG_IMPLANT | Freq: Once | SUBCUTANEOUS | Status: AC
  Administered 2023-10-17: 3.6 mg via SUBCUTANEOUS
  Filled 2023-10-17: qty 3.6

## 2023-10-18 ENCOUNTER — Encounter: Payer: Self-pay | Admitting: Adult Health

## 2023-10-18 ENCOUNTER — Telehealth: Payer: Self-pay | Admitting: Student

## 2023-10-18 ENCOUNTER — Encounter: Payer: Self-pay | Admitting: Hematology and Oncology

## 2023-10-18 ENCOUNTER — Encounter: Payer: Self-pay | Admitting: Student

## 2023-10-18 ENCOUNTER — Other Ambulatory Visit: Payer: Self-pay | Admitting: Student

## 2023-10-18 ENCOUNTER — Telehealth: Payer: Self-pay | Admitting: *Deleted

## 2023-10-18 DIAGNOSIS — E66811 Obesity, class 1: Secondary | ICD-10-CM

## 2023-10-18 NOTE — Telephone Encounter (Signed)
 Received call from patient asking if we could feel a reasonable accommodation form for her so she could stay working from home.  She is super anxious and states she has PTSD from this diagnosis. She still receiving monthly Zoladex  injections.  Informed her that we don't have supporting evidence from office notes to be able to complete the form and that she is not on active treatment right and with yearly f/u's and I didn't think that would qualify.  I encouraged her to reach to her PCP also, but requested a long term survivorship appt to discuss this. Patient verbalized understanding. Informed patient her paperwork was left at front desk for her to pick up.Msg sent for an appt

## 2023-10-19 NOTE — Telephone Encounter (Signed)
 Patient contacted and scheduled for 6/2 with PCP.

## 2023-10-19 NOTE — Telephone Encounter (Signed)
 Reviewed form Placed form in PCP's box for completion.  Christ Courier, CMA

## 2023-10-22 ENCOUNTER — Ambulatory Visit (INDEPENDENT_AMBULATORY_CARE_PROVIDER_SITE_OTHER): Admitting: Student

## 2023-10-22 VITALS — BP 122/75 | Wt 172.2 lb

## 2023-10-22 DIAGNOSIS — F411 Generalized anxiety disorder: Secondary | ICD-10-CM

## 2023-10-22 MED ORDER — SERTRALINE HCL 50 MG PO TABS
50.0000 mg | ORAL_TABLET | Freq: Every day | ORAL | 0 refills | Status: AC
Start: 2023-10-22 — End: ?

## 2023-10-22 NOTE — Assessment & Plan Note (Signed)
 Poorly controlled, it appears this is related to multifactorial reasons.  She has an unhealthy work environment and has great stress relating to this and additionally is affected by any ongoing concerns of her breast cancer returning or being diagnosed on the other side.  Increase Zoloft  to 50 mg daily, advised weekly counseling appointments.  I will complete reasonable accommodation form to have her work from home for approximately 2 more months related to anxiety.  I have advised her that I am not able to speak towards the medical side of immunosuppression regarding her anastrozole  and Zoladex  as these are hormone suppressants but I am unaware of them you know suppression effects.  Further, while it is reasonable to have her out this time to have her anxiety better controlled, the anxiety regarding her unhealthy work environment would not be appropriate reason to write her out for medical reasons.  I have advised her that if she is continually being affected by this despite coping mechanisms, she may need to consider changes in her work situation if she feels her employer is not healthy for her.  She recognized this as well and states her husband has had the same conversation with her, and she will consider this.  Follow-up in 1 month with new PCP regarding how changes to medication and counseling have gone and continued conversation of appropriate work restrictions.

## 2023-10-22 NOTE — Progress Notes (Signed)
  SUBJECTIVE:   CHIEF COMPLAINT / HPI:   Depression/anxiety: Brooke Marks is a 43 year old female presenting for conversation regarding resume accommodation paperwork leading to anxiety and her previous breast cancer diagnosis.  Recap: She experiences pain in her left breast, initially similar to premenstrual symptoms, with redness around the nipple that resolved by the next morning. A palpable lump around the areola was discovered later. She has an appointment at the [facility] on Thursday.  She is experiencing significant anxiety and depression. She resumed Zoloft  25 mg two weeks ago and sees a counselor monthly. She feels overwhelmed by her current situation, including work-related stress and the loss of her mother in April 2022.  She has been working from home since March 2020 and seeks reasonable accommodation to continue due to her anxiety and depression. She has exhausted her sick leave and feels unable to concentrate or function effectively at work.  PHQ-9 score of 21 with negative question 9. GAD-7 score of 21  PERTINENT  PMH / PSH: HTN, prediabetes, HLD, breast cancer s/p mastectomy, MDD/anxiety   OBJECTIVE:  BP 122/75   Wt 172 lb 3.2 oz (78.1 kg)   BMI 31.50 kg/m  General: NAD Psych: Tearful, anxious  ASSESSMENT/PLAN:   Assessment & Plan GAD (generalized anxiety disorder) Poorly controlled, it appears this is related to multifactorial reasons.  She has an unhealthy work environment and has great stress relating to this and additionally is affected by any ongoing concerns of her breast cancer returning or being diagnosed on the other side.  Increase Zoloft  to 50 mg daily, advised weekly counseling appointments.  I will complete reasonable accommodation form to have her work from home for approximately 2 more months related to anxiety.  I have advised her that I am not able to speak towards the medical side of immunosuppression regarding her anastrozole  and Zoladex  as  these are hormone suppressants but I am unaware of them you know suppression effects.  Further, while it is reasonable to have her out this time to have her anxiety better controlled, the anxiety regarding her unhealthy work environment would not be appropriate reason to write her out for medical reasons.  I have advised her that if she is continually being affected by this despite coping mechanisms, she may need to consider changes in her work situation if she feels her employer is not healthy for her.  She recognized this as well and states her husband has had the same conversation with her, and she will consider this.  Follow-up in 1 month with new PCP regarding how changes to medication and counseling have gone and continued conversation of appropriate work restrictions. Return in about 1 month (around 11/21/2023) for Anxiety follow-up. Veronia Goon, DO 10/22/2023, 10:15 AM PGY-3, [facility]

## 2023-10-22 NOTE — Patient Instructions (Signed)
 It was great to see you today! Thank you for choosing [facility] for your primary care.  Today we addressed: Increase Zoloft , please start to see your counselor weekly.  I sent in the increased dosage of Zoloft .  I will complete the forms today and try to get my office to have them reviewed and call you back within the next day or 2.  If you haven't already, sign up for My Chart to have easy access to your labs results, and communication with your primary care physician.  Return in about 1 month (around 11/21/2023) for Anxiety follow-up. Please arrive 15 minutes before your appointment to ensure smooth check in process.  We appreciate your efforts in making this happen.  Thank you for allowing me to participate in your care, Veronia Goon, DO 10/22/2023, 9:47 AM PGY-3, [facility]

## 2023-10-22 NOTE — Telephone Encounter (Signed)
 Patient called and informed that forms are ready for pick up. Copy made and placed in batch scanning. Original placed at front desk for pick up.   Veronda Prude, RN

## 2023-10-22 NOTE — Telephone Encounter (Signed)
 Completed and signed paperwork. Placed in RN triage box.

## 2023-10-25 ENCOUNTER — Ambulatory Visit
Admission: RE | Admit: 2023-10-25 | Discharge: 2023-10-25 | Disposition: A | Discharge status: Home / Self Care | Source: Ambulatory Visit | Attending: Hematology and Oncology | Admitting: Hematology and Oncology

## 2023-10-25 DIAGNOSIS — Z170 Estrogen receptor positive status [ER+]: Secondary | ICD-10-CM

## 2023-10-25 HISTORY — DX: Personal history of irradiation: Z92.3

## 2023-10-30 ENCOUNTER — Encounter: Payer: Self-pay | Admitting: *Deleted

## 2023-10-31 ENCOUNTER — Other Ambulatory Visit: Payer: Self-pay | Admitting: Student

## 2023-10-31 ENCOUNTER — Encounter: Payer: Self-pay | Admitting: Student

## 2023-10-31 DIAGNOSIS — E66811 Obesity, class 1: Secondary | ICD-10-CM

## 2023-10-31 MED ORDER — ZEPBOUND 12.5 MG/0.5ML ~~LOC~~ SOAJ: 12.5000 mg | SUBCUTANEOUS | 0 refills | Status: DC

## 2023-10-31 MED ORDER — TIRZEPATIDE 12.5 MG/0.5ML ~~LOC~~ SOAJ
12.5000 mg | SUBCUTANEOUS | 0 refills | Status: DC
Start: 2023-10-31 — End: 2023-10-31

## 2023-11-13 ENCOUNTER — Inpatient Hospital Stay: Payer: Federal, State, Local not specified - PPO | Attending: Hematology and Oncology

## 2023-11-13 VITALS — BP 133/88 | HR 89 | Temp 98.5°F | Resp 18

## 2023-11-13 DIAGNOSIS — Z79899 Other long term (current) drug therapy: Secondary | ICD-10-CM | POA: Insufficient documentation

## 2023-11-13 DIAGNOSIS — Z5111 Encounter for antineoplastic chemotherapy: Secondary | ICD-10-CM | POA: Diagnosis present

## 2023-11-13 DIAGNOSIS — Z170 Estrogen receptor positive status [ER+]: Secondary | ICD-10-CM | POA: Diagnosis not present

## 2023-11-13 DIAGNOSIS — C50811 Malignant neoplasm of overlapping sites of right female breast: Secondary | ICD-10-CM | POA: Diagnosis present

## 2023-11-13 MED ORDER — GOSERELIN ACETATE 3.6 MG ~~LOC~~ IMPL
3.6000 mg | DRUG_IMPLANT | Freq: Once | SUBCUTANEOUS | Status: AC
  Administered 2023-11-13: 3.6 mg via SUBCUTANEOUS
  Filled 2023-11-13: qty 3.6

## 2023-11-19 LAB — SIGNATERA
SIGNATERA MTM READOUT: 0 MTM/ml
SIGNATERA TEST RESULT: NEGATIVE

## 2023-12-11 ENCOUNTER — Inpatient Hospital Stay: Payer: Federal, State, Local not specified - PPO | Attending: Hematology and Oncology

## 2023-12-11 VITALS — BP 132/88 | HR 89 | Temp 98.4°F | Resp 18

## 2023-12-11 DIAGNOSIS — Z5111 Encounter for antineoplastic chemotherapy: Secondary | ICD-10-CM | POA: Diagnosis present

## 2023-12-11 DIAGNOSIS — C50811 Malignant neoplasm of overlapping sites of right female breast: Secondary | ICD-10-CM | POA: Insufficient documentation

## 2023-12-11 DIAGNOSIS — Z170 Estrogen receptor positive status [ER+]: Secondary | ICD-10-CM | POA: Diagnosis not present

## 2023-12-11 DIAGNOSIS — Z79899 Other long term (current) drug therapy: Secondary | ICD-10-CM | POA: Diagnosis not present

## 2023-12-11 MED ORDER — GOSERELIN ACETATE 3.6 MG ~~LOC~~ IMPL
3.6000 mg | DRUG_IMPLANT | Freq: Once | SUBCUTANEOUS | Status: AC
Start: 2023-12-11 — End: 2023-12-11
  Administered 2023-12-11: 3.6 mg via SUBCUTANEOUS
  Filled 2023-12-11: qty 3.6

## 2024-01-08 ENCOUNTER — Inpatient Hospital Stay: Payer: Federal, State, Local not specified - PPO | Attending: Hematology and Oncology

## 2024-01-08 VITALS — BP 123/89 | HR 104 | Temp 98.3°F | Resp 14

## 2024-01-08 DIAGNOSIS — Z79899 Other long term (current) drug therapy: Secondary | ICD-10-CM | POA: Diagnosis not present

## 2024-01-08 DIAGNOSIS — C50811 Malignant neoplasm of overlapping sites of right female breast: Secondary | ICD-10-CM | POA: Insufficient documentation

## 2024-01-08 DIAGNOSIS — Z170 Estrogen receptor positive status [ER+]: Secondary | ICD-10-CM | POA: Diagnosis not present

## 2024-01-08 DIAGNOSIS — Z5111 Encounter for antineoplastic chemotherapy: Secondary | ICD-10-CM | POA: Diagnosis present

## 2024-01-08 MED ORDER — GOSERELIN ACETATE 3.6 MG ~~LOC~~ IMPL
3.6000 mg | DRUG_IMPLANT | Freq: Once | SUBCUTANEOUS | Status: AC
  Administered 2024-01-08: 3.6 mg via SUBCUTANEOUS
  Filled 2024-01-08: qty 3.6

## 2024-02-03 ENCOUNTER — Other Ambulatory Visit: Payer: Self-pay | Admitting: Family Medicine

## 2024-02-04 ENCOUNTER — Other Ambulatory Visit: Payer: Self-pay

## 2024-02-05 ENCOUNTER — Inpatient Hospital Stay (HOSPITAL_BASED_OUTPATIENT_CLINIC_OR_DEPARTMENT_OTHER): Payer: Federal, State, Local not specified - PPO | Admitting: Hematology and Oncology

## 2024-02-05 ENCOUNTER — Other Ambulatory Visit: Payer: Self-pay | Admitting: *Deleted

## 2024-02-05 ENCOUNTER — Inpatient Hospital Stay: Payer: Federal, State, Local not specified - PPO | Attending: Hematology and Oncology

## 2024-02-05 ENCOUNTER — Inpatient Hospital Stay: Payer: Federal, State, Local not specified - PPO

## 2024-02-05 VITALS — BP 118/80 | HR 101 | Temp 98.0°F | Resp 18 | Ht 62.0 in | Wt 164.4 lb

## 2024-02-05 DIAGNOSIS — C50811 Malignant neoplasm of overlapping sites of right female breast: Secondary | ICD-10-CM | POA: Diagnosis not present

## 2024-02-05 DIAGNOSIS — Z1732 Human epidermal growth factor receptor 2 negative status: Secondary | ICD-10-CM | POA: Diagnosis not present

## 2024-02-05 DIAGNOSIS — Z79811 Long term (current) use of aromatase inhibitors: Secondary | ICD-10-CM | POA: Diagnosis not present

## 2024-02-05 DIAGNOSIS — Z170 Estrogen receptor positive status [ER+]: Secondary | ICD-10-CM | POA: Diagnosis not present

## 2024-02-05 DIAGNOSIS — Z5111 Encounter for antineoplastic chemotherapy: Secondary | ICD-10-CM | POA: Insufficient documentation

## 2024-02-05 DIAGNOSIS — Z1721 Progesterone receptor positive status: Secondary | ICD-10-CM | POA: Diagnosis not present

## 2024-02-05 DIAGNOSIS — Z79899 Other long term (current) drug therapy: Secondary | ICD-10-CM | POA: Insufficient documentation

## 2024-02-05 LAB — CBC WITH DIFFERENTIAL (CANCER CENTER ONLY)
Abs Immature Granulocytes: 0.01 K/uL (ref 0.00–0.07)
Basophils Absolute: 0 K/uL (ref 0.0–0.1)
Basophils Relative: 1 %
Eosinophils Absolute: 0.1 K/uL (ref 0.0–0.5)
Eosinophils Relative: 2 %
HCT: 37.1 % (ref 36.0–46.0)
Hemoglobin: 12.1 g/dL (ref 12.0–15.0)
Immature Granulocytes: 0 %
Lymphocytes Relative: 40 %
Lymphs Abs: 2.3 K/uL (ref 0.7–4.0)
MCH: 26.1 pg (ref 26.0–34.0)
MCHC: 32.6 g/dL (ref 30.0–36.0)
MCV: 80 fL (ref 80.0–100.0)
Monocytes Absolute: 0.3 K/uL (ref 0.1–1.0)
Monocytes Relative: 5 %
Neutro Abs: 3 K/uL (ref 1.7–7.7)
Neutrophils Relative %: 52 %
Platelet Count: 227 K/uL (ref 150–400)
RBC: 4.64 MIL/uL (ref 3.87–5.11)
RDW: 15.1 % (ref 11.5–15.5)
WBC Count: 5.8 K/uL (ref 4.0–10.5)
nRBC: 0 % (ref 0.0–0.2)

## 2024-02-05 LAB — CMP (CANCER CENTER ONLY)
ALT: 8 U/L (ref 0–44)
AST: 12 U/L — ABNORMAL LOW (ref 15–41)
Albumin: 4.2 g/dL (ref 3.5–5.0)
Alkaline Phosphatase: 65 U/L (ref 38–126)
Anion gap: 5 (ref 5–15)
BUN: 14 mg/dL (ref 6–20)
CO2: 30 mmol/L (ref 22–32)
Calcium: 9.3 mg/dL (ref 8.9–10.3)
Chloride: 106 mmol/L (ref 98–111)
Creatinine: 0.51 mg/dL (ref 0.44–1.00)
GFR, Estimated: >60 mL/min (ref >60)
Glucose, Bld: 92 mg/dL (ref 70–99)
Potassium: 3.8 mmol/L (ref 3.5–5.1)
Sodium: 141 mmol/L (ref 135–145)
Total Bilirubin: 0.6 mg/dL (ref 0.0–1.2)
Total Protein: 7.8 g/dL (ref 6.5–8.1)

## 2024-02-05 MED ORDER — GOSERELIN ACETATE 3.6 MG ~~LOC~~ IMPL: 3.6000 mg | DRUG_IMPLANT | Freq: Once | SUBCUTANEOUS | Status: DC

## 2024-02-05 MED ORDER — GOSERELIN ACETATE 10.8 MG ~~LOC~~ IMPL
10.8000 mg | DRUG_IMPLANT | Freq: Once | SUBCUTANEOUS | Status: AC
  Administered 2024-02-05: 10.8 mg via SUBCUTANEOUS
  Filled 2024-02-05: qty 10.8

## 2024-02-05 NOTE — Assessment & Plan Note (Signed)
 05/08/2022:Right lumpectomy: Grade 2 IDC 8 cm with intermediate grade DCIS, margins negative, ER 70%, PR 90%, HER2 negative 1+, 1/4 lymph nodes positive with focal extracapsular extension   Treatment plan: MammaPrint testing 05/30/2022: Ultralow risk Adjuvant radiation therapy 07/14/2022-08/24/2022 Adjuvant antiestrogen therapy: Ovarian function suppression plus AI started 08/24/2022 Genetic testing: Negative ------------------------------------------------------------------------------------------------------------------ Complete estrogen blockade toxicities: Tolerating the treatment extremely well   Breast cancer surveillance: Breast exam 02/05/2024: Benign Mammogram and ultrasound left breast 11/14/2023: Benign, 0.3 cm sebaceous cyst Signatera: Negative   Return to clinic in 1 year for follow-up.  She will come monthly for Zoladex  injections.

## 2024-02-05 NOTE — Progress Notes (Signed)
 Patient Care Team: [name], DO as PCP - General (Family Medicine) [name], MD as Consulting Physician (Hematology and Oncology) [name], MD as Consulting Physician (General Surgery) [name], MD as Attending Physician (Radiation Oncology)  DIAGNOSIS:  Encounter Diagnosis  Name Primary?   Malignant neoplasm of overlapping 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, estrogen receptor positive (HCC) Yes    SUMMARY OF ONCOLOGIC HISTORY: Oncology History  Malignant neoplasm of overlapping 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, estrogen receptor positive (HCC)  03/08/2022 Initial Diagnosis   Palpable lump in the right breast, mammogram and ultrasound revealed 0.8 cm and 0.8 cm and 0.5 cm masses, 10 cm suspicious calcifications, single enlarged right axillary lymph node 11:00: Grade 2 IDC with IG DCIS ER 70%, PR 90%, Ki-67 20%, HER2 1+ negative Lymph node: Positive UIQ and UOQ right breast: Grade 2 IDC with IG DCIS   03/17/2022 Cancer Staging   Staging form: Breast, AJCC 8th Edition - Clinical stage from 03/17/2022: Stage IB (cT1b, cN1, cM0, G2, ER+, PR+, HER2-) - Signed by [name], MD on 03/17/2022 Stage prefix: Initial diagnosis Histologic grading system: 3 grade system   03/30/2022 Genetic Testing   Negative [facility] CustomNext-Cancer +RNAinsight Panel.  Report date is 03/30/2022.   The CustomNext-Cancer+RNAinsight panel offered by [employer] [facility] includes sequencing and rearrangement analysis for the following 37 genes:  APC, ATM, BARD1, BMPR1A, BRCA1, BRCA2, BRIP1, CDH1, CDK4, CDKN2A, CHEK2, DICER1, MLH1, MSH2, MSH6, MUTYH, NBN, NF1, NTHL1, PALB2, PMS2, PTEN, RAD51C, RAD51D, RECQL, SMAD4, SMARCA4, STK11 and TP53 (sequencing and deletion/duplication); AXIN2, CTNNA1, HOXB13, MSH3, POLD1 and POLE (sequencing only); EPCAM and GREM1 (deletion/duplication only). 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, margins negative, ER 70%, PR 90%, HER2 negative 1+, 1/4 lymph nodes positive with focal extracapsular extension   05/24/2022 Cancer Staging   Staging form: Breast, AJCC 8th Edition - Pathologic: Stage IB (pT3, pN1, cM0, G2, ER+, PR+, HER2-) - Signed by [name], MD on 05/24/2022 Histologic grading system: 3 grade system   07/13/2022 - 08/23/2022 Radiation Therapy   Plan Name: CW_R_BO Site: Chest Wall, Right Technique: 3D Mode: Photon Dose Per Fraction: 2 Gy Prescribed Dose (Delivered / Prescribed): 26 Gy / 26 Gy Prescribed Fxs (Delivered / Prescribed): 13 / 13   Plan Name: CW_R Site: Chest Wall, Right Technique: 3D Mode: Photon Dose Per Fraction: 2 Gy Prescribed Dose (Delivered / Prescribed): 24 Gy / 24 Gy Prescribed Fxs (Delivered / Prescribed): 12 / 12   Plan Name: CW_R_Scv Site: Chest Wall, Right Technique: 3D Mode: Photon Dose Per Fraction: 2 Gy Prescribed Dose (Delivered / Prescribed): 50 Gy / 50 Gy Prescribed Fxs (Delivered / Prescribed): 25 / 25   Plan Name: CW_R_Bst_BO Site: Chest Wall, Right Technique: Electron Mode: Electron Dose Per Fraction: 2 Gy Prescribed Dose (Delivered / Prescribed): 10 Gy / 10 Gy Prescribed Fxs (Delivered / Prescribed): 5 / 5   08/2022 -  Anti-estrogen oral therapy   Anastrozole  daily with zoladex  every 4 weeks     CHIEF COMPLIANT:   HISTORY OF PRESENT ILLNESS: Discussed the use of AI scribe software for clinical note transcription with the patient, who gave verbal consent to proceed.  History of Present Illness [name] is a 43 year old female who presents with concerns related to her current injection treatment.  She experiences dyspareunia, which she attributes to vaginal  dryness, similar to her experience with the Depo shot in 2005 when she also had amenorrhea. The pain is associated with decreased libido. She initially experienced frequent urinary tract  infections, which she associates with the dryness and cracking. She is currently on a monthly injection and is interested in switching to a three-month injection due to the inconvenience of frequent visits.     ALLERGIES:  has no known allergies.  MEDICATIONS:  Current Outpatient Medications  Medication Sig Dispense Refill   amLODipine -valsartan  (EXFORGE ) 10-160 MG tablet TAKE 1 TABLET BY MOUTH EVERY DAY 90 tablet 3   anastrozole  (ARIMIDEX ) 1 MG tablet TAKE 1 TABLET BY MOUTH EVERY DAY 90 tablet 3   goserelin (ZOLADEX ) 3.6 MG injection Inject 3.6 mg into the skin every 28 (twenty-eight) days.     hydrOXYzine  (ATARAX ) 10 MG tablet TAKE 1 TABLET BY MOUTH THREE TIMES A DAY AS NEEDED 60 tablet 0   Multiple Vitamin (MULTIVITAMIN) tablet Take 1 tablet by mouth daily.     sertraline  (ZOLOFT ) 50 MG tablet Take 1 tablet (50 mg total) by mouth daily. 60 tablet 0   tirzepatide  (ZEPBOUND ) 10 MG/0.5ML Pen INJECT 10 MG INTO THE SKIN ONE TIME PER WEEK 6 mL 0   tirzepatide  (ZEPBOUND ) 12.5 MG/0.5ML Pen Inject 12.5 mg into the skin once a week. 6 mL 0   No current facility-administered medications for this visit.    PHYSICAL EXAMINATION: ECOG PERFORMANCE STATUS: 1 - Symptomatic but completely ambulatory  There were no vitals filed for this visit. There were no vitals filed for this visit.  Physical Exam   (exam performed in the presence of a chaperone)  LABORATORY DATA:  I have reviewed the data as listed    Latest Ref Rng & Units 03/28/2023    8:43 AM 11/02/2022    2:58 PM 10/26/2022    1:23 PM  CMP  Glucose 70 - 99 mg/dL 87  84  94   BUN 6 - 24 mg/dL 14  9  8    Creatinine 0.57 - 1.00 mg/dL 9.33  9.27  9.38   Sodium 134 - 144 mmol/L 140  140  138   Potassium 3.5 - 5.2 mmol/L 4.1  4.3  3.6   Chloride 96 - 106 mmol/L 100  98  101   CO2 20 - 29 mmol/L 23  25  29    Calcium 8.7 - 10.2 mg/dL 9.9  9.9  9.1   Total Protein 6.5 - 8.1 g/dL   8.4   Total Bilirubin 0.3 - 1.2 mg/dL   0.6   Alkaline  Phos 38 - 126 U/L   53   AST 15 - 41 U/L   19   ALT 0 - 44 U/L   20     Lab Results  Component Value Date   WBC 5.8 02/05/2024   HGB 12.1 02/05/2024   HCT 37.1 02/05/2024   MCV 80.0 02/05/2024   PLT 227 02/05/2024   NEUTROABS 3.0 02/05/2024    ASSESSMENT & PLAN:  Malignant neoplasm of overlapping 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, estrogen receptor positive (HCC) 05/08/2022:Right lumpectomy: Grade 2 IDC 8 cm with intermediate grade DCIS, margins negative, ER 70%, PR 90%, HER2 negative 1+, 1/4 lymph nodes positive with focal extracapsular extension   Treatment plan: MammaPrint testing 05/30/2022: Ultralow risk Adjuvant radiation therapy 07/14/2022-08/24/2022 Adjuvant antiestrogen therapy: Ovarian function suppression plus AI started 08/24/2022 Genetic testing: Negative ------------------------------------------------------------------------------------------------------------------ Complete estrogen blockade toxicities: Tolerating the treatment extremely well She would like to explore  if she can do the Zoladex  every 50-month injection. I discussed with her that we could certainly try that but we would like to check estradiol  levels at 44-month and 64-month points so that we do not see an increase in the estrogen levels on the every 13-month injection.   Breast cancer surveillance: Breast exam 02/05/2024: Benign Mammogram and ultrasound left breast 11/14/2023: Benign, 0.3 cm sebaceous cyst Signatera: Negative   I would like to do a telephone visit 1 week after the 913-month injection to review the estradiol  levels and make a decision on the frequency of injections. Assessment & Plan Estrogen receptor positive right breast cancer Currently on goserelin for ovarian suppression. She expressed interest in a three-month injection schedule. Monitoring estrogen levels is necessary to ensure suppression. - Switch to three-month goserelin injection schedule. - Monitor estrogen levels during the second and  third months. - Schedule lab appointments for estrogen level checks in the second and third months. - Schedule injection appointment every three months.  Vaginal dryness and dyspareunia Associated with ovarian suppression therapy. Discussed Estrace  vaginal cream for relief without increasing breast cancer risk. - Prescribe Estrace  vaginal cream twice a week.  Decreased libido Potentially related to dyspareunia. Addressing dryness with Estrace  may improve libido.  Recurrent urinary tract infections Potentially exacerbated by vaginal dryness. Improving moisture with Estrace  may reduce risk.      No orders of the defined types were placed in this encounter.  The patient has a good understanding of the overall plan. she agrees with it. she will call with any problems that may develop before the next visit here. Total time spent: 30 mins including face to face time and time spent for planning, charting and co-ordination of care   Naomi MARLA Chad, MD 02/05/24

## 2024-02-07 ENCOUNTER — Other Ambulatory Visit: Payer: Self-pay | Admitting: *Deleted

## 2024-02-07 ENCOUNTER — Encounter: Payer: Self-pay | Admitting: Hematology and Oncology

## 2024-02-08 ENCOUNTER — Other Ambulatory Visit: Payer: Self-pay | Admitting: *Deleted

## 2024-02-08 ENCOUNTER — Encounter: Payer: Self-pay | Admitting: Hematology and Oncology

## 2024-02-08 MED ORDER — ESTRADIOL 0.1 MG/GM VA CREA: 1.0000 | TOPICAL_CREAM | VAGINAL | 12 refills | Status: AC

## 2024-02-08 NOTE — Progress Notes (Signed)
 Verbal orders received and placed per MD for pt to be prescribed Estrace  Cream vaginally, pea sized amount, twice a week.  Pt educated and verbalized understanding.

## 2024-02-11 ENCOUNTER — Other Ambulatory Visit: Payer: Self-pay | Admitting: Hematology and Oncology

## 2024-02-21 ENCOUNTER — Ambulatory Visit

## 2024-02-21 VITALS — BP 129/85 | HR 106 | Ht 62.0 in | Wt 159.8 lb

## 2024-02-21 DIAGNOSIS — R7303 Prediabetes: Secondary | ICD-10-CM | POA: Diagnosis not present

## 2024-02-21 DIAGNOSIS — E66811 Obesity, class 1: Secondary | ICD-10-CM | POA: Diagnosis not present

## 2024-02-21 NOTE — Assessment & Plan Note (Signed)
 Patient doing well on 12.5 mg of Zepbound  weekly. She has lost 25 pounds since January She endorses tight compliance with high-quality dietary strategies and regular exercise consistent with standard recommendations.

## 2024-02-21 NOTE — Progress Notes (Signed)
    SUBJECTIVE:   CHIEF COMPLAINT / HPI:   Patient presents to establish care and to get renewed prior authorization for her Zepbound .  She reports she is feeling very well since starting on Zepbound .  She has lost 20 pounds since she started in January, feels lighter, healthier, less sluggish and bloated.  She feels that it has helped a lot with preventing excessive weight gain from her goserelin injections that she gets as a result of her breast cancer history.  She has been eating a high protein low carb diet. Eats a lot of eggs, chicken, salads and nuts. Doesn't like sweets anymore since starting the zepbound . Exercises 5 times a week, at least 30 minutes per day. Vigorous walking, dancing.  She feels it is a healthy and sustainable lifestyle.  She currently has enough doses of her Zepbound  to last her until November.  She reports that she is managing her mood well without medication.  She stopped taking her Zoloft  in June.  She feels that is more consistent with her spiritual beliefs to feel her feelings and does not want to cover anything up with medications.  Discussed the relative risks and benefits of medical therapy and agreed to continue an open conversation about these things in the future.  She lives at home with her daughter and husband in says that things are going well at home.  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  PERTINENT  PMH / PSH: Anxiety, depression, obesity, history of breast cancer  OBJECTIVE:   BP 129/85   Pulse (!) 106   Ht 5' 2 (1.575 m)   Wt 159 lb 12.8 oz (72.5 kg)   SpO2 99%   BMI 29.23 kg/m   General: Awake, alert, NAD. Communicates clearly. Cardio: RRR. Normal S1, S2. No murmur, rub, gallop. 2+ radial and dorsalis pedis pulses b/l w/ good capillary refill.  Resp: CTA bilaterally. No wheezes, rales, or rhonchi. Normal work of breathing on  room air.  Psych: Appropriate mood and affect. No SI/HI.    ASSESSMENT/PLAN:   Assessment & Plan Obesity (BMI 30.0-34.9) Prediabetes Patient doing well on 12.5 mg of Zepbound  weekly. She has lost 25 pounds since January She endorses tight compliance with high-quality dietary strategies and regular exercise consistent with standard recommendations.   Return to clinic in 2 months  Leafy Scriver, DO [facility]

## 2024-03-14 ENCOUNTER — Telehealth: Payer: Self-pay

## 2024-03-14 NOTE — Telephone Encounter (Signed)
 Patient calls nurse line regarding Zepbound .   She reports that insurance is requiring PA.   Will forward to [place] for assistance with PA.   Chiquita JAYSON English, RN

## 2024-03-17 ENCOUNTER — Encounter: Payer: Self-pay | Admitting: Hematology and Oncology

## 2024-03-17 ENCOUNTER — Other Ambulatory Visit (HOSPITAL_COMMUNITY): Payer: Self-pay

## 2024-03-17 NOTE — Telephone Encounter (Signed)
 Prior authorization submitted for ZEPBOUND  12.5MG  to [facility] via Latent.   Key: [id number]

## 2024-03-18 NOTE — Telephone Encounter (Signed)
 Pharmacy Patient Advocate Encounter  Received notification from [facility] that Prior Authorization for ZEPBOUND  12.5MG  has been APPROVED from 03/17/24 to 03/17/25   PA #/Case ID/Reference #: [id number]

## 2024-03-19 NOTE — Telephone Encounter (Signed)
 Called pharmacy and provided with approval.   Pharmacist reports that medication was picked up on 02/06/24 for a 90 day supply.   Called patient. She did not answer, sent patient mychart message.   Chiquita JAYSON English, RN

## 2024-04-07 ENCOUNTER — Inpatient Hospital Stay: Attending: Hematology and Oncology

## 2024-04-07 DIAGNOSIS — C50811 Malignant neoplasm of overlapping sites of right female breast: Secondary | ICD-10-CM | POA: Diagnosis present

## 2024-04-07 DIAGNOSIS — Z170 Estrogen receptor positive status [ER+]: Secondary | ICD-10-CM | POA: Diagnosis not present

## 2024-04-12 LAB — ESTRADIOL, ULTRA SENS: Estradiol, Sensitive: <2.5 pg/mL

## 2024-04-16 MED ORDER — ZEPBOUND 12.5 MG/0.5ML ~~LOC~~ SOAJ: 12.5000 mg | SUBCUTANEOUS | 0 refills | Status: DC

## 2024-04-16 NOTE — Addendum Note (Signed)
 Addended by: [name] on: 04/16/2024 10:38 AM   Modules accepted: Orders

## 2024-04-16 NOTE — Addendum Note (Signed)
 Addended by: Tanyika Barros C on: 04/16/2024 09:45 AM   Modules accepted: Orders

## 2024-04-23 ENCOUNTER — Other Ambulatory Visit: Payer: Self-pay | Admitting: *Deleted

## 2024-04-23 DIAGNOSIS — Z170 Estrogen receptor positive status [ER+]: Secondary | ICD-10-CM

## 2024-04-23 NOTE — Progress Notes (Signed)
 Signatera renewal orders placed.

## 2024-04-24 ENCOUNTER — Telehealth: Payer: Self-pay

## 2024-04-24 NOTE — Telephone Encounter (Signed)
 Patient calls nurse line regarding coverage for Zepbound . She reports that she received letter from the [employer] company stating that coverage was changing and their preferred medication would be Wegovy .   Patient reports that she has history of failure on Wegovy . She was told by insurance that PA would need to be completed again with this information.   Advised patient that I would send message to pharmacy team for assistance with PA.   Patient appreciative.   Chiquita JAYSON English, RN

## 2024-04-25 ENCOUNTER — Other Ambulatory Visit (HOSPITAL_COMMUNITY): Payer: Self-pay

## 2024-04-30 ENCOUNTER — Encounter: Payer: Self-pay | Admitting: Hematology and Oncology

## 2024-04-30 ENCOUNTER — Other Ambulatory Visit (HOSPITAL_COMMUNITY): Payer: Self-pay

## 2024-04-30 NOTE — Telephone Encounter (Signed)
 Attempted to submit PA to [employer].  Per insurance: Your PA has been resolved, no additional PA is required. For further inquiries please contact the number on the back of the member prescription card.

## 2024-05-06 ENCOUNTER — Other Ambulatory Visit: Payer: Self-pay | Admitting: *Deleted

## 2024-05-06 DIAGNOSIS — C50811 Malignant neoplasm of overlapping sites of right female breast: Secondary | ICD-10-CM

## 2024-05-07 ENCOUNTER — Inpatient Hospital Stay: Attending: Hematology and Oncology

## 2024-05-07 VITALS — BP 105/78 | HR 96 | Resp 18

## 2024-05-07 DIAGNOSIS — Z1732 Human epidermal growth factor receptor 2 negative status: Secondary | ICD-10-CM | POA: Insufficient documentation

## 2024-05-07 DIAGNOSIS — Z87440 Personal history of urinary (tract) infections: Secondary | ICD-10-CM | POA: Diagnosis not present

## 2024-05-07 DIAGNOSIS — N941 Unspecified dyspareunia: Secondary | ICD-10-CM | POA: Diagnosis not present

## 2024-05-07 DIAGNOSIS — Z170 Estrogen receptor positive status [ER+]: Secondary | ICD-10-CM | POA: Insufficient documentation

## 2024-05-07 DIAGNOSIS — C50811 Malignant neoplasm of overlapping sites of right female breast: Secondary | ICD-10-CM

## 2024-05-07 DIAGNOSIS — R6882 Decreased libido: Secondary | ICD-10-CM | POA: Diagnosis not present

## 2024-05-07 DIAGNOSIS — Z79811 Long term (current) use of aromatase inhibitors: Secondary | ICD-10-CM | POA: Diagnosis not present

## 2024-05-07 DIAGNOSIS — N390 Urinary tract infection, site not specified: Secondary | ICD-10-CM | POA: Insufficient documentation

## 2024-05-07 DIAGNOSIS — Z1721 Progesterone receptor positive status: Secondary | ICD-10-CM | POA: Insufficient documentation

## 2024-05-07 DIAGNOSIS — Z79899 Other long term (current) drug therapy: Secondary | ICD-10-CM | POA: Diagnosis not present

## 2024-05-07 LAB — CMP (CANCER CENTER ONLY)
ALT: 10 U/L (ref 0–44)
AST: 18 U/L (ref 15–41)
Albumin: 4.4 g/dL (ref 3.5–5.0)
Alkaline Phosphatase: 72 U/L (ref 38–126)
Anion gap: 11 (ref 5–15)
BUN: 8 mg/dL (ref 6–20)
CO2: 29 mmol/L (ref 22–32)
Calcium: 9.5 mg/dL (ref 8.9–10.3)
Chloride: 102 mmol/L (ref 98–111)
Creatinine: 0.67 mg/dL (ref 0.44–1.00)
GFR, Estimated: >60 mL/min (ref >60)
Glucose, Bld: 117 mg/dL — ABNORMAL HIGH (ref 70–99)
Potassium: 3.1 mmol/L — ABNORMAL LOW (ref 3.5–5.1)
Sodium: 141 mmol/L (ref 135–145)
Total Bilirubin: 0.7 mg/dL (ref 0.0–1.2)
Total Protein: 8.2 g/dL — ABNORMAL HIGH (ref 6.5–8.1)

## 2024-05-07 LAB — CBC WITH DIFFERENTIAL (CANCER CENTER ONLY)
Abs Immature Granulocytes: 0.01 K/uL (ref 0.00–0.07)
Basophils Absolute: 0.1 K/uL (ref 0.0–0.1)
Basophils Relative: 1 %
Eosinophils Absolute: 0.1 K/uL (ref 0.0–0.5)
Eosinophils Relative: 2 %
HCT: 38.4 % (ref 36.0–46.0)
Hemoglobin: 12.9 g/dL (ref 12.0–15.0)
Immature Granulocytes: 0 %
Lymphocytes Relative: 41 %
Lymphs Abs: 2 K/uL (ref 0.7–4.0)
MCH: 26 pg (ref 26.0–34.0)
MCHC: 33.6 g/dL (ref 30.0–36.0)
MCV: 77.3 fL — ABNORMAL LOW (ref 80.0–100.0)
Monocytes Absolute: 0.4 K/uL (ref 0.1–1.0)
Monocytes Relative: 7 %
Neutro Abs: 2.4 K/uL (ref 1.7–7.7)
Neutrophils Relative %: 49 %
Platelet Count: 311 K/uL (ref 150–400)
RBC: 4.97 MIL/uL (ref 3.87–5.11)
RDW: 15.2 % (ref 11.5–15.5)
WBC Count: 5 K/uL (ref 4.0–10.5)
nRBC: 0 % (ref 0.0–0.2)

## 2024-05-07 MED ORDER — GOSERELIN ACETATE 10.8 MG ~~LOC~~ IMPL
10.8000 mg | DRUG_IMPLANT | Freq: Once | SUBCUTANEOUS | Status: AC
  Administered 2024-05-07: 08:00:00 10.8 mg via SUBCUTANEOUS
  Filled 2024-05-07: qty 10.8

## 2024-05-12 LAB — ESTRADIOL, ULTRA SENS: Estradiol, Sensitive: <2.5 pg/mL

## 2024-05-14 ENCOUNTER — Encounter: Payer: Self-pay | Admitting: Hematology and Oncology

## 2024-05-14 ENCOUNTER — Inpatient Hospital Stay: Admitting: Hematology and Oncology

## 2024-05-14 DIAGNOSIS — Z170 Estrogen receptor positive status [ER+]: Secondary | ICD-10-CM | POA: Diagnosis not present

## 2024-05-14 DIAGNOSIS — C50811 Malignant neoplasm of overlapping sites of right female breast: Secondary | ICD-10-CM | POA: Diagnosis not present

## 2024-05-14 NOTE — Progress Notes (Signed)
 HEMATOLOGY-ONCOLOGY TELEPHONE VISIT PROGRESS NOTE  I connected with our patient on 05/14/2024 at  8:15 AM EST by telephone and verified that I am speaking with the correct person using two identifiers.  I discussed the limitations, risks, security and privacy concerns of performing an evaluation and management service by telephone and the availability of in person appointments.  I also discussed with the patient that there may be a patient responsible charge related to this service. The patient expressed understanding and agreed to proceed.   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  She is on adjuvant anti-estrogen therapy with daily anastrozole  and goserelin injections every three months. Recent labs show persistently low estradiol . She has no new symptoms.  She has vaginal dryness, dyspareunia, decreased libido, and recurrent urinary tract infections from estrogen deprivation, managed with Estrace  vaginal cream twice weekly. She has no new or worsening genitourinary symptoms today.  She recently submitted FMLA paperwork and plans to check its status with the front desk.      Oncology History  Malignant neoplasm of overlapping 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, estrogen receptor positive (HCC)  03/08/2022 Initial Diagnosis   Palpable lump in the right breast, mammogram and ultrasound revealed 0.8 cm and 0.8 cm and 0.5 cm masses, 10 cm suspicious calcifications, single enlarged right axillary lymph node 11:00: Grade 2 IDC with IG DCIS ER 70%, PR 90%, Ki-67 20%, HER2 1+ negative Lymph node: Positive UIQ and UOQ right breast: Grade 2 IDC with IG DCIS   03/17/2022 Cancer Staging    Staging form: Breast, AJCC 8th Edition - Clinical stage from 03/17/2022: Stage IB (cT1b, cN1, cM0, G2, ER+, PR+, HER2-) - Signed by [name], MD on 03/17/2022 Stage prefix: Initial diagnosis Histologic grading system: 3 grade system   03/30/2022 Genetic Testing   Negative [facility] CustomNext-Cancer +RNAinsight Panel.  Report date is 03/30/2022.   The CustomNext-Cancer+RNAinsight panel offered by [facility] [facility] includes sequencing and rearrangement analysis for the following 37 genes:  APC, ATM, BARD1, BMPR1A, BRCA1, BRCA2, BRIP1, CDH1, CDK4, CDKN2A, CHEK2, DICER1, MLH1, MSH2, MSH6, MUTYH, NBN, NF1, NTHL1, PALB2, PMS2, PTEN, RAD51C, RAD51D, RECQL, SMAD4, SMARCA4, STK11 and TP53 (sequencing and deletion/duplication); AXIN2, CTNNA1, HOXB13, MSH3, POLD1 and POLE (sequencing only); EPCAM and GREM1 (deletion/duplication only). RNA data is routinely analyzed for use in variant interpretation for all genes   05/08/2022 Surgery   Right lumpectomy: Grade 2 IDC 8 cm with intermediate grade DCIS, margins negative, ER 70%, PR 90%, HER2 negative 1+, 1/4 lymph nodes positive with focal extracapsular extension   05/24/2022 Cancer Staging   Staging form: Breast, AJCC 8th Edition - Pathologic: Stage IB (pT3, pN1, cM0, G2, ER+, PR+, HER2-) - Signed by [name], MD on 05/24/2022 Histologic grading system: 3 grade system   07/13/2022 - 08/23/2022 Radiation Therapy   Plan Name: CW_R_BO Site: Chest Wall, Right Technique: 3D Mode: Photon Dose Per Fraction: 2 Gy Prescribed Dose (Delivered / Prescribed): 26 Gy / 26 Gy Prescribed Fxs (Delivered / Prescribed): 13 / 13   Plan Name: CW_R Site: Chest Wall, Right Technique: 3D Mode: Photon Dose Per Fraction: 2 Gy Prescribed Dose (Delivered / Prescribed): 24 Gy / 24 Gy Prescribed Fxs (Delivered / Prescribed): 12 / 12   Plan Name:  CW_R_Scv Site: Chest Wall, Right Technique: 3D Mode: Photon Dose Per Fraction: 2 Gy Prescribed Dose (Delivered / Prescribed): 50 Gy /  50 Gy Prescribed Fxs (Delivered / Prescribed): 25 / 25   Plan Name: CW_R_Bst_BO Site: Chest Wall, Right Technique: Electron Mode: Electron Dose Per Fraction: 2 Gy Prescribed Dose (Delivered / Prescribed): 10 Gy / 10 Gy Prescribed Fxs (Delivered / Prescribed): 5 / 5   08/2022 -  Anti-estrogen oral therapy   Anastrozole  daily with zoladex  every 4 weeks     REVIEW OF SYSTEMS:   Constitutional: Denies fevers, chills or abnormal weight loss All other systems were reviewed with the patient and are negative. Observations/Objective:     Assessment Plan:  Malignant neoplasm of overlapping 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, estrogen receptor positive (HCC) 05/08/2022:Right lumpectomy: Grade 2 IDC 8 cm with intermediate grade DCIS, margins negative, ER 70%, PR 90%, HER2 negative 1+, 1/4 lymph nodes positive with focal extracapsular extension   Treatment plan: MammaPrint testing 05/30/2022: Ultralow risk Adjuvant radiation therapy 07/14/2022-08/24/2022 Adjuvant antiestrogen therapy: Ovarian function suppression plus AI started 08/24/2022 Genetic testing: Negative ------------------------------------------------------------------------------------------------------------------ Complete estrogen blockade toxicities: Tolerating the treatment extremely well Currently on Zoladex  every 60-month injection.  (Last injection 05/07/2024) Estradiol  level  04/07/2024: Less than 2.4 05/07/2024: Less than 2.5   Based on excellent estrogen suppression, we can continue the every 61-month injections.  Breast cancer surveillance: Breast exam 02/05/2024: Benign Mammogram and ultrasound left breast 10/25/2023: Benign, 0.3 cm sebaceous cyst Signatera: Negative   Every 3 months injection and follow-up with me in 6 months      I discussed the assessment and treatment plan with the patient. The patient was provided an opportunity to ask questions and all were answered. The patient agreed with the plan and demonstrated  an understanding of the instructions. The patient was advised to call back or seek an in-person evaluation if the symptoms worsen or if the condition fails to improve as anticipated.   I provided 20 minutes of non-face-to-face time during this encounter.  This includes time for charting and coordination of care   Naomi MARLA Chad, MD

## 2024-05-14 NOTE — Assessment & Plan Note (Signed)
 05/08/2022:Right lumpectomy: Grade 2 IDC 8 cm with intermediate grade DCIS, margins negative, ER 70%, PR 90%, HER2 negative 1+, 1/4 lymph nodes positive with focal extracapsular extension   Treatment plan: MammaPrint testing 05/30/2022: Ultralow risk Adjuvant radiation therapy 07/14/2022-08/24/2022 Adjuvant antiestrogen therapy: Ovarian function suppression plus AI started 08/24/2022 Genetic testing: Negative ------------------------------------------------------------------------------------------------------------------ Complete estrogen blockade toxicities: Tolerating the treatment extremely well Currently on Zoladex  every 42-month injection.  (Last injection 05/07/2024) Estradiol  level  04/07/2024: Less than 2.4 05/07/2024: Less than 2.5   Based on excellent estrogen suppression, we can continue the every 45-month injections.  Breast cancer surveillance: Breast exam 02/05/2024: Benign Mammogram and ultrasound left breast 10/25/2023: Benign, 0.3 cm sebaceous cyst Signatera: Negative   Every 3 months injection and follow-up

## 2024-05-16 LAB — SIGNATERA
SIGNATERA MTM READOUT: 0 MTM/ml
SIGNATERA TEST RESULT: NEGATIVE

## 2024-05-23 ENCOUNTER — Other Ambulatory Visit: Payer: Self-pay

## 2024-05-26 ENCOUNTER — Other Ambulatory Visit: Payer: Self-pay

## 2024-05-26 DIAGNOSIS — I10 Essential (primary) hypertension: Secondary | ICD-10-CM

## 2024-05-28 ENCOUNTER — Telehealth: Payer: Self-pay

## 2024-05-28 NOTE — Telephone Encounter (Signed)
 Notified the pt in regards to her FMLA form being completed, faxed, and confirmation received. Pt verbalized understanding. Pt stated that she will pick up tomorrow 05/29/2024.

## 2024-06-05 ENCOUNTER — Encounter: Payer: Self-pay | Admitting: Hematology and Oncology

## 2024-06-05 ENCOUNTER — Other Ambulatory Visit (HOSPITAL_COMMUNITY): Payer: Self-pay

## 2024-06-05 NOTE — Telephone Encounter (Signed)
 Pharmacy Patient Advocate Encounter    Insurance verification completed.   The patient is insured through [facility].   PA required; PA submitted to above mentioned insurance via Latent Key/confirmation #/EOC [id number]. Status is pending

## 2024-06-09 ENCOUNTER — Other Ambulatory Visit (HOSPITAL_COMMUNITY): Payer: Self-pay

## 2024-06-09 NOTE — Telephone Encounter (Signed)
 Pharmacy Patient Advocate Encounter  Received notification from [facility] that Prior Authorization for ZEPBOUND  12.5MG  has been APPROVED from 06/07/24 to 06/07/25

## 2024-06-19 ENCOUNTER — Ambulatory Visit: Payer: Self-pay

## 2024-06-19 VITALS — BP 117/82 | HR 96 | Wt 153.6 lb

## 2024-06-19 DIAGNOSIS — R0683 Snoring: Secondary | ICD-10-CM

## 2024-06-19 DIAGNOSIS — E66811 Obesity, class 1: Secondary | ICD-10-CM

## 2024-06-19 DIAGNOSIS — E2839 Other primary ovarian failure: Secondary | ICD-10-CM | POA: Diagnosis not present

## 2024-06-19 DIAGNOSIS — R7303 Prediabetes: Secondary | ICD-10-CM | POA: Diagnosis not present

## 2024-06-19 LAB — POCT GLYCOSYLATED HEMOGLOBIN (HGB A1C): Hemoglobin A1C: 5.2 % (ref 4.0–5.6)

## 2024-06-19 NOTE — Progress Notes (Signed)
" ° ° °  SUBJECTIVE:   CHIEF COMPLAINT / HPI:   Patient presents to discuss Zepbound  coverage. She has been taking zepbound  since early 2025 and has experienced 30lb weight loss, significant improvement in her energy, subjective experience of inflammation in her joints and body overall. She feels overall much healthier since starting the medication. She was informed by her insurance that a monthly supply pre deductible would cost her $450, whereas last year she was gettiing 3 months for $75. She did not meet her deductible last year.   She experienced significant weight gain since starting her zoladex  shots for hormonal management of her breast cancer. A trial of wegovy  in late 2024 did not work for her, she experienced further weight gain while on the medication despite optimal diet and exercise habits.   She has continued to adhere to a strict low carb diet, primarily eating vegetables, protein and some nuts. She exercises up to an hour most days of the week.   PERTINENT  PMH / PSH: T2DM, obesity, breast cancer  OBJECTIVE:   BP 117/82   Pulse 96   Wt 153 lb 9.6 oz (69.7 kg)   SpO2 100%   BMI 28.09 kg/m   General: Awake, alert, NAD. Communicates clearly. Cardio: RRR. Normal S1, S2. No murmur, rub, gallop.  Resp: CTA bilaterally.   ASSESSMENT/PLAN:   Assessment & Plan Obesity (BMI 30.0-34.9) Down to 153lbs today from 159 in oct 2025 Recently took her final dose of Zepbound  Patient continues to adhere tightly to a low carb diet consisting primarily of high quality protein, vegetables and legumes.  She is exercising 5-6 days / week, 30-60 minutes per day.  Previous trial on wegovy  was unsuccessful in late 2024. She has achieved ~30lb weight loss while on Zepbound  throughout 2025 while continuing to take her goserelin injections.   Per patient, her Zepbound  coverage has changed this year and 1 month supply will now cost $450 vs $200 for a 3 months last year. This is financially infeasible.   Refilled Zepbound  12.5mg  weekly given no hx of T2DM. OSA eval pending.  Prediabetes A1c down to 5.2 today Continue with current diet and exercise practices.  Continue zepbound   Hypoestrogenism Initial Dexa scan ordered today, will need q2 year screening given estrogen suppressive therapy iso her breast cancer history.  Snoring Patient endorses AM fatigue and snoring. Unsure if anyone has viewed her stop breathing during sleep. +HTN medication.  High risk for OSA on STOPBANG Split night sleep study ordered    Brooke Scriver, DO [facility] "

## 2024-06-19 NOTE — Assessment & Plan Note (Addendum)
 Down to 153lbs today from 65 in oct 2025 Recently took her final dose of Zepbound  Patient continues to adhere tightly to a low carb diet consisting primarily of high quality protein, vegetables and legumes.  She is exercising 5-6 days / week, 30-60 minutes per day.  Previous trial on wegovy  was unsuccessful in late 2024. She has achieved ~30lb weight loss while on Zepbound  throughout 2025 while continuing to take her goserelin injections.   Per patient, her Zepbound  coverage has changed this year and 1 month supply will now cost $450 vs $200 for a 3 months last year. This is financially infeasible.  Refilled Zepbound  12.5mg  weekly given no hx of T2DM. OSA eval pending.

## 2024-06-20 ENCOUNTER — Telehealth: Payer: Self-pay | Admitting: Pharmacist

## 2024-06-20 MED ORDER — ZEPBOUND 12.5 MG/0.5ML ~~LOC~~ SOAJ: 12.5000 mg | SUBCUTANEOUS | 0 refills | Status: AC

## 2024-06-20 NOTE — Assessment & Plan Note (Addendum)
 A1c down to 5.2 today Continue with current diet and exercise practices.  Continue zepbound 

## 2024-06-20 NOTE — Telephone Encounter (Signed)
 Patient contacted for follow-up of request for Zepbound  (tirzepatide ) support   Patient congratulated on weight loss.  States weight goal is to lose additional 17lbs.  We discussed sleep apnea study result will be important for insurance coverage of Zepbound  (tirzepatide )  in 2026. While we wait for this study to be completed, we discussed using sample.  The lack of sampling of Zepbound  (tirzepatide ) and lack of anything but starting dose of Mounjaro  (tirzepatide ) permit only a small dose of tirzepatide  for appetite suppression.   Patient was thankful and agreeable to use the low dose Mounjaro  (tirzepatide ) 2.5mg  weekly dose starting this Sunday for short-term while OSA evaluation/sleep study is completed.    Medication Samples have been provided for patient pick-up   Drug name: Mounjaro  (tirzepatide )        Strength: 2.5mg         Qty: 4 pens   LOT: [id number]  Exp.Date: 12/15/25  Dosing instructions: inject weekly.   The patient has been instructed regarding the correct time, dose, and frequency of taking this medication, including desired effects and most common side effects.   Vitor Overbaugh 2:23 PM 06/20/2024  Placed at front desk for pick-up as patient plans to pick-up in 1 hour.  Total time with patient call and documentation of interaction: 17 minutes.

## 2024-06-24 NOTE — Telephone Encounter (Signed)
 Reviewed and agree with Dr Rennis plan.

## 2024-08-04 ENCOUNTER — Inpatient Hospital Stay: Attending: Hematology and Oncology

## 2024-11-04 ENCOUNTER — Inpatient Hospital Stay: Attending: Hematology and Oncology

## 2025-02-04 ENCOUNTER — Inpatient Hospital Stay: Attending: Hematology and Oncology
